# Patient Record
Sex: Male | Born: 1960 | Race: Black or African American | Hispanic: No | Marital: Married | Smoking: Former smoker
Health system: Southern US, Community
[De-identification: ages and names within clinical notes are randomized; demographics above are authoritative.]

## PROBLEM LIST (undated history)

## (undated) DIAGNOSIS — K648 Other hemorrhoids: Secondary | ICD-10-CM

## (undated) DIAGNOSIS — K635 Polyp of colon: Secondary | ICD-10-CM

## (undated) DIAGNOSIS — Z5189 Encounter for other specified aftercare: Secondary | ICD-10-CM

## (undated) DIAGNOSIS — E43 Unspecified severe protein-calorie malnutrition: Secondary | ICD-10-CM

## (undated) DIAGNOSIS — J449 Chronic obstructive pulmonary disease, unspecified: Secondary | ICD-10-CM

## (undated) DIAGNOSIS — I10 Essential (primary) hypertension: Secondary | ICD-10-CM

## (undated) DIAGNOSIS — D649 Anemia, unspecified: Secondary | ICD-10-CM

## (undated) DIAGNOSIS — J189 Pneumonia, unspecified organism: Secondary | ICD-10-CM

## (undated) DIAGNOSIS — F17200 Nicotine dependence, unspecified, uncomplicated: Secondary | ICD-10-CM

## (undated) DIAGNOSIS — D500 Iron deficiency anemia secondary to blood loss (chronic): Secondary | ICD-10-CM

## (undated) DIAGNOSIS — D563 Thalassemia minor: Secondary | ICD-10-CM

## (undated) DIAGNOSIS — A048 Other specified bacterial intestinal infections: Secondary | ICD-10-CM

## (undated) HISTORY — DX: Essential (primary) hypertension: I10

## (undated) HISTORY — DX: Anemia, unspecified: D64.9

## (undated) HISTORY — DX: Iron deficiency anemia secondary to blood loss (chronic): D50.0

## (undated) HISTORY — DX: Polyp of colon: K63.5

## (undated) HISTORY — DX: Unspecified severe protein-calorie malnutrition: E43

## (undated) HISTORY — PX: OTHER SURGICAL HISTORY: SHX169

## (undated) HISTORY — DX: Pneumonia, unspecified organism: J18.9

## (undated) HISTORY — DX: Encounter for other specified aftercare: Z51.89

## (undated) HISTORY — DX: Other hemorrhoids: K64.8

## (undated) HISTORY — DX: Chronic obstructive pulmonary disease, unspecified: J44.9

## (undated) HISTORY — DX: Thalassemia minor: D56.3

## (undated) HISTORY — DX: Other specified bacterial intestinal infections: A04.8

---

## 1998-06-23 ENCOUNTER — Emergency Department (HOSPITAL_COMMUNITY): Admission: EM | Admit: 1998-06-23 | Discharge: 1998-06-23 | Payer: Self-pay | Admitting: Emergency Medicine

## 2000-06-18 ENCOUNTER — Emergency Department (HOSPITAL_COMMUNITY): Admission: EM | Admit: 2000-06-18 | Discharge: 2000-06-18 | Payer: Self-pay | Admitting: Emergency Medicine

## 2000-06-18 ENCOUNTER — Encounter: Payer: Self-pay | Admitting: Emergency Medicine

## 2001-06-03 ENCOUNTER — Encounter: Payer: Self-pay | Admitting: Emergency Medicine

## 2001-06-03 ENCOUNTER — Emergency Department (HOSPITAL_COMMUNITY): Admission: EM | Admit: 2001-06-03 | Discharge: 2001-06-03 | Payer: Self-pay | Admitting: Emergency Medicine

## 2001-08-29 ENCOUNTER — Emergency Department (HOSPITAL_COMMUNITY): Admission: EM | Admit: 2001-08-29 | Discharge: 2001-08-29 | Payer: Self-pay

## 2005-11-18 ENCOUNTER — Ambulatory Visit: Payer: Self-pay | Admitting: Cardiology

## 2005-11-18 ENCOUNTER — Emergency Department (HOSPITAL_COMMUNITY): Admission: EM | Admit: 2005-11-18 | Discharge: 2005-11-18 | Payer: Self-pay | Admitting: Emergency Medicine

## 2006-07-01 ENCOUNTER — Emergency Department (HOSPITAL_COMMUNITY): Admission: EM | Admit: 2006-07-01 | Discharge: 2006-07-01 | Payer: Self-pay | Admitting: Emergency Medicine

## 2006-07-03 ENCOUNTER — Encounter: Admission: RE | Admit: 2006-07-03 | Discharge: 2006-07-03 | Payer: Self-pay | Admitting: Occupational Medicine

## 2006-07-28 ENCOUNTER — Ambulatory Visit (HOSPITAL_COMMUNITY): Admission: RE | Admit: 2006-07-28 | Discharge: 2006-07-29 | Payer: Self-pay | Admitting: Neurological Surgery

## 2007-06-12 ENCOUNTER — Emergency Department (HOSPITAL_COMMUNITY): Admission: EM | Admit: 2007-06-12 | Discharge: 2007-06-12 | Payer: Self-pay | Admitting: Emergency Medicine

## 2008-09-12 ENCOUNTER — Ambulatory Visit (HOSPITAL_COMMUNITY): Admission: RE | Admit: 2008-09-12 | Discharge: 2008-09-12 | Payer: Self-pay | Admitting: Orthopedic Surgery

## 2008-10-19 ENCOUNTER — Ambulatory Visit (HOSPITAL_COMMUNITY): Admission: RE | Admit: 2008-10-19 | Discharge: 2008-10-19 | Payer: Self-pay | Admitting: Orthopedic Surgery

## 2009-08-17 ENCOUNTER — Emergency Department (HOSPITAL_COMMUNITY): Admission: EM | Admit: 2009-08-17 | Discharge: 2009-08-17 | Payer: Self-pay | Admitting: Emergency Medicine

## 2009-09-27 ENCOUNTER — Encounter: Admission: RE | Admit: 2009-09-27 | Discharge: 2009-09-27 | Payer: Self-pay | Admitting: Nephrology

## 2009-11-17 ENCOUNTER — Encounter: Admission: RE | Admit: 2009-11-17 | Discharge: 2009-11-17 | Payer: Self-pay | Admitting: Neurological Surgery

## 2010-01-01 ENCOUNTER — Encounter (INDEPENDENT_AMBULATORY_CARE_PROVIDER_SITE_OTHER): Payer: Self-pay | Admitting: Neurological Surgery

## 2010-01-01 ENCOUNTER — Ambulatory Visit (HOSPITAL_COMMUNITY): Admission: RE | Admit: 2010-01-01 | Discharge: 2010-01-02 | Payer: Self-pay | Admitting: Neurological Surgery

## 2010-09-17 ENCOUNTER — Encounter: Admission: RE | Admit: 2010-09-17 | Discharge: 2010-09-17 | Payer: Self-pay | Admitting: Neurological Surgery

## 2011-02-24 LAB — CBC
HCT: 42.1 % (ref 39.0–52.0)
Hemoglobin: 13.7 g/dL (ref 13.0–17.0)
MCHC: 32.6 g/dL (ref 30.0–36.0)
MCV: 79.7 fL (ref 78.0–100.0)
Platelets: 165 10*3/uL (ref 150–400)
WBC: 7.3 10*3/uL (ref 4.0–10.5)

## 2011-02-24 LAB — BASIC METABOLIC PANEL
Calcium: 9.5 mg/dL (ref 8.4–10.5)
Creatinine, Ser: 1.02 mg/dL (ref 0.4–1.5)
GFR calc Af Amer: >60 mL/min (ref >60)
Sodium: 139 mEq/L (ref 135–145)

## 2011-04-23 NOTE — Op Note (Signed)
NAME:  TRUMAN, ACEITUNO                ACCOUNT NO.:  [id number]   MEDICAL RECORD NO.:  [id number]          PATIENT TYPE:  AMB   LOCATION:  SDS                          FACILITY:  [facility]   PHYSICIAN:  Vania Rea. Supple, M.D.  DATE OF BIRTH:  15-Jun-1961   DATE OF PROCEDURE:  10/19/2008  DATE OF DISCHARGE:                               OPERATIVE REPORT   PREOPERATIVE DIAGNOSES:  1. Chronic right shoulder impingement syndrome.  2. 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   POSTOPERATIVE DIAGNOSES:  1. Chronic right shoulder impingement syndrome.  2. 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  3. Complex and extensive degenerative labral tear.  4. Partial articular rotator cuff tear.   PROCEDURES:  1. Right shoulder examination under anesthesia.  2. Right shoulder diagnostic arthroscopy.  3. Debridement of complex and extensive labral tear involving the      anterior, superior, and posterior aspects of labrum.  4. Debridement of partial articular rotator cuff tear.  5. Arthroscopic subacromial decompression and bursectomy.  6. Arthroscopic distal clavicle resection.   SURGEON:  Vania Rea. Supple, MD   ASSISTANT:  Lucita Lora. Shuford, P.A.-C.   ANESTHESIA:  General endotracheal as well as a preop interscalene block.   ESTIMATED BLOOD LOSS:  Minimal.   DRAINS:  None.   HISTORY:  Mr. Heart is a 50 year old gentleman who has had chronic  right shoulder pain with weakness and restriction in mobility and  symptoms that have been refractory to prolonged attempts at conservative  management.  His examination shows a severely positive impingement sign  with radiographs confirming AC joint degenerative changes.  Due to his  ongoing pain, functional limitations, and failure to respond to  prolonged attempts at conservative management, he is brought to the  operating room at this time for planned right shoulder arthroscopy as  described below.   Preoperatively counseled Mr. Mecham on treatment options as well as  risks and benefits thereof.  Possible surgical complications of  bleeding, infection, neurovascular injury, persistent pain, loss of  motion, anesthetic complication, possible need for additional surgery  are reviewed.  He understands and accepts and agrees with our planned  procedure.   PROCEDURE IN DETAIL:  After undergoing routine preop evaluation, the  patient received prophylactic antibiotics.  An interscalene block was  established in holding area by the Anesthesia Department.  Placed supine  on the operating table and underwent smooth induction of a general  endotracheal anesthesia.  Turned to the left lateral decubitus position  on a beanbag and appropriately padded and protected.  A right shoulder  examination under anesthesia revealed full motion with glenohumeral  mobility of the upper limits of normal.  Right arm was suspended at 70  degrees of abduction with 10 pounds traction.  The right shoulder girdle  region was sterilely prepped and draped in standard fashion.  Posterior  portal was established from the glenohumeral joint and anterior portal  was established under direct visualization  The glenohumeral articular  surfaces were in good condition.  The glenohumeral joint volume was at  the upper limits and normal.  There was  an element of mild  multidirectional instability with capacious capsular volume.  However,  it did not identify any obvious pathologic instability patterns.  There  was, however, degenerative tearing of the anterior, superior, and  posterior labrum, and all the degenerative labral tear was debrided back  to a stable margin with a shaver.  The biceps tendon showed normal  caliber and quality tissue and was stable both proximally and distally.  The anterior rotator cuff was in good condition.  There was, however, a  partial articular sided tear involving the distal infraspinatus.   This  area was debrided with a shaver.  We did pass a tag suture of 0 PDS at  this level.  We then completed the inspection within the glenohumeral  joint.  Hemostasis was obtained.  Fluid and instruments were removed.  The arm was then dropped down to 30 degrees of abduction with the  arthroscope introduced in the subacromial space at the posterior portal,  direct lateral portal was established in the subacromial space.  There  was noted be abundant dense proliferative bursal tissue throughout the  subacromial plus the subdeltoid bursa and this was removed with a  combination of the shaver and the Arthrex wand.  The wand was then used  to remove the periosteum from the undersurface of the anterior half of  the acromion and then a subacromial decompression was performed with a  bur creating a type of morphology.  A portal was then established in the  trochlear anterior to distal clavicle.  The [facility] joint showed advanced  arthritic changes.  Distal clavicle resection was performed with a bur  removing approximately 8-mm of bone and visualization of the entire  circumference, the distal clavicle did confirm adequate removal of bone.  We then completed a subacromial/subdeltoid bursectomy.  The bursal  surface of the rotator cuff was then carefully inspected and probed in  particularly the area around the tag suture.  I did not identify any  compromised area of tendon tissue when I evaluated from the bursal side.  The tag suture was removed.  Bursectomy was completed.  Hemostasis was  obtained.  Fluid and instruments were removed.  The portals was closed  with Monocryl and Steri-Strips.  A bulky dry dressing was then taped  over the right shoulder and her right arm was placed in a sling  immobilizer.  The patient was placed supine, extubated, and taken to the  recovery room in stable condition.      Vania Rea. Supple, M.D.  Electronically Signed     KMS/MEDQ  D:  10/19/2008  T:   10/20/2008  Job:  [id number]

## 2011-04-26 NOTE — Consult Note (Signed)
NAME:  Vincent [name], Vincent [name] NO.:  [id number]   MEDICAL RECORD NO.:  [id number]          PATIENT TYPE:  EMS   LOCATION:  MAJO                         FACILITY:  [facility]   PHYSICIAN:  Olga Millers, M.D. LHCDATE OF BIRTH:  Feb 27, 1961   DATE OF CONSULTATION:  DATE OF DISCHARGE:                                   CONSULTATION   REFERRING PHYSICIAN:  Dr. Linwood Dibbles.   PRIMARY CARE PHYSICIAN:  None.   PRIMARY CARDIOLOGIST:  New and will be Dr. Jens Som.   CHIEF COMPLAINT:  Chest pain.   HISTORY OF PRESENT ILLNESS:  Vincent [name] is a 50 year old male with no  history of coronary artery disease.  He had onset of left back pain at  approximately 11 a.m. yesterday that moved around to the lower left part of  his chest.  His symptoms are worse with deep inspiration and coughing.  He  says he slept okay.  He says the pain is 1/10 and increases to a 10/10 with  cough or deep inspiration.  He did not try any medication for this at all.  He has never had these symptoms before.  He went to [facility] and received  aspirin and was sent to the emergency room, where he has been on IV  nitroglycerin, but it has not helped.  He was transported to the ER by EMS  from [facility].   PAST MEDICAL HISTORY:  He has a new history of diabetes, hypertension and  hyperlipidemia, although his cholesterol has not recently been checked.  His  cardiac risk factors are family history of coronary artery disease, ongoing  tobacco use and ETOH use.  He has a history of chest pain with Cardiolite  approximately a year ago that he says was ordered by Dr. Barbee Shropshire and was  negative.  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   SURGICAL HISTORY:  None.   ALLERGIES:  None.   MEDICATIONS:  No prescriptions.  No regular over-the-counter medications.   SOCIAL HISTORY:  He lives in [place] with his wife and works as a Clinical cytogeneticist and part-time at [employer].  He has  approximately a 30-  pack-year history of tobacco use.  He says he drinks a little bit less than  a 6-pack a day during the week and more on the weekends, but no hard liquor.  He denies drug use.   FAMILY HISTORY:  His mother is alive at age 9 and takes heart pills.  His  father died at age 63 with an MI and his sister is alive, but had bypass  surgery at age 11.   REVIEW OF SYSTEMS:  Review of systems is significant for a 10-pound weight  loss in the last couple of months, although he says he is not dieting.  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  The chest pain is described  above; he has shortness of breath with this.  He denies dyspnea on exertion,  orthopnea, PND, edema or palpitations.  He has occasional coughing and  wheezing.  He denies  hematemesis or melena, but he has daily dark blood in  his stools.  He denies reflux symptoms.  Review of systems is otherwise  negative.   PHYSICAL EXAMINATION:  VITAL SIGNS:  Temperature is 97.5 degrees, blood  pressure 149/92, pulse 92, respiratory rate 22, O2 saturation 98% on room  air.  GENERAL:  He is a slender African American male in no acute distress.  HEENT:  His head is normocephalic and atraumatic with extraocular movements  intact.  Sclera is cloudy on the right.  Nares are without discharge.  NECK:  There is no lymphadenopathy, thyromegaly, bruit or JVD noted.  CV:  His heart is regular in rate and rhythm with an S1, S2 and an S4 with  no significant murmur, and no rub is noted.  Distal pulses are 2+ and no  femoral bruits are appreciated.  LUNGS:  Clear to auscultation bilaterally.  SKIN:  No rashes or lesions are noted.  ABDOMEN:  Soft and nontender with active bowel sounds and no  hepatosplenomegaly by palpation.  EXTREMITIES:  There is no cyanosis, clubbing or edema.  MUSCULOSKELETAL:  There is no joint deformity or effusion and no spine or  CVA tenderness is noted.  NEUROLOGIC:  He is alert and oriented.   Cranial nerves II-XII are grossly  intact.   RADIOLOGIC FINDINGS:  Chest x-ray:  No acute disease.   ACCESSORY CLINICAL DATA:  EKG:  Sinus rhythm, rate 83, with no acute  ischemic changes and no old is available for the comparison.   LABORATORY VALUES:  D-dimer less than 0.22.  Point-of-care markers negative  x3, except for the third myoglobin was elevated at 268.   IMPRESSION:  Vincent [name] is a 50 year old male with cardiac risk factor of  family history and tobacco, who presents with chest pain.  The pain has been  constant since yesterday, when it began in his back and radiated to the left  side of his chest.  It is increased with certain movements and is sharp.  There is no associated nausea, vomiting, diaphoresis or shortness of breath,  except it increases with deep inspiration.  He does not have a history of  exertional chest pain, dyspnea on exertion or congestive heart failure  symptoms.  His EKG is normal sinus rhythm with no ST changes and his chest x-  ray has no acute disease.  Cardiac enzymes are negative greater than 24  hours after onset of pain and D-dimer is negative as well.  His chest pain  is consistent with musculoskeletal pain.  This will be treated with  nonsteroidal anti-inflammatory drugs.  He has ruled out because his chest  pain is greater than 24 hours in duration with negative enzymes.  He  apparently had a negative nuclear study approximately a year ago.  He will  be discharged home from the emergency room and follow up with [facility].  No  further cardiac workup is indicated.  A TSH will be obtained prior to  discharge with his history of weight loss and questionable exophthalmus.   COMMENT:  This is Theodore Demark, P.A.-C dictating for Dr. Olga Millers,  who saw the patient and determined the plan of care.      Theodore Demark, P.A. [facility]    ______________________________  Olga Millers, M.D. [facility]   RB/MEDQ  D:  11/18/2005  T:  11/19/2005  Job:   [id number]

## 2011-04-26 NOTE — Op Note (Signed)
NAME:  RAMONE, GANDER                ACCOUNT NO.:  [id number]   MEDICAL RECORD NO.:  [id number]          PATIENT TYPE:  OIB   LOCATION:  3010                         FACILITY:  [facility]   PHYSICIAN:  Stefani Dama, M.D.  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   PROCEDURE:  Anterior cervical decompression C6-C7 arthrodesis with peak  spacer and allograft with fixation with Alphatec plate Z6-S0.   SURGEON:  Stefani Dama, M.D.   ANESTHESIA:  General endotracheal.   INDICATIONS:  Nechemia Chiappetta is a 50 year old individual who has neck,  shoulder, and arm pain bilaterally.  He has evidence of a herniated nucleus  pulposus at the C6-C7 level.  He has particular weakness in the C8  distribution secondary to myelopathy.  Having failed effort at conservative  treatment noting that his symptoms seem only to be getting worse, he has  been advised regarding surgical decompression.   DESCRIPTION OF PROCEDURE:  The patient was brought to the operating room,  placed on table in the supine position after the smooth induction of general  endotracheal anesthesia.  He was placed in 5 pounds of Holter traction and  the neck was prepped with DuraPrep and draped in the sterile fashion.  A  transverse incision was made in the lower portion of the neck on the left  side.  This was carried down through the platysma.  The plane between the  sternocleidomastoid and the strap muscles was dissected bluntly until the  prevertebral space was reached.  The first identifiable disk space was noted  to be that of C6-C7.  The longus coli muscle was stripped off either side of  the ventral aspect of the vertebral body and then 30 mm  Caspar blades were  placed under the longus colli muscles, so as to allow a self-retaining  retractor to rest in the space.  Diskectomy was then performed at C6-C7 by  opening the anterior longitudinal ligament using a #15 blade removing a  large portion of the ventral aspect of the disk.  The disk was noted be  moderately degenerated; and a small curette was then used to remove the  other portions of this, particularly that attached to the endplates.  As the  region of the posterior longitudinal ligament was reached on the right side,  particularly there was noted be a significant subligamentous protrusion of  the disk that seemed to allow for compression of the common dural tube with  the spinal cord by bowing the posterior longitudinal ligament towards the  canal this material was removed in the posterior longitudinal ligament was  ultimately opened.   This area was decompressed down to the right side removing a moderate size  uncinate spur.  On the left side no such spur existed; however, there was  also subligamentous herniated disk material which was  removed; and this  allowed for a good decompression out to the region of the foramen.  Once the  dura was decompressed the endplates were then shaved smooth over the high-  speed bur and a 2.3-mm dissecting tool and then the interspace was sized for  appropriate size spacer; and it was felt that a medium size 7-mm spacer  would fit nicely into this interspace.  This was then packed with  demineralized bone matrix; and placed into the interspace at the C6-C7  level.  A ventral plate was placed; and this was felt to be fitted best with  an 18-mm standard size Alphatec plate; that was fixed with fixed angle  screws in the vertebral bodies of C7 and variable angle screws measuring 14  mm in length in the body of C6.  Once this was placed final localizing  radiograph identified good position of the hardware.  The hardware was  locked into  the plate and hemostasis was then checked carefully in the soft  tissues.  Once this was ascertained to be the case, the platysma was then  closed with 3-0 Vicryl interrupted fashion, 3-0 Vicryl used in the  subcuticular tissues.  Dermabond was placed on the skin.  The patient  tolerated the procedure well; and was returned to recovery room in stable  condition.      Stefani Dama, M.D.  Electronically Signed     HJE/MEDQ  D:  07/28/2006  T:  07/29/2006  Job:  [id number]

## 2011-09-10 LAB — CBC
MCV: 75.3 — ABNORMAL LOW
Platelets: 133 — ABNORMAL LOW
RBC: 3.96 — ABNORMAL LOW
RDW: 15.7 — ABNORMAL HIGH

## 2011-09-10 LAB — APTT: aPTT: 29

## 2011-09-10 LAB — URINALYSIS, ROUTINE W REFLEX MICROSCOPIC
Glucose, UA: NEGATIVE
Hgb urine dipstick: NEGATIVE
Ketones, ur: 15 — AB
Nitrite: NEGATIVE
Protein, ur: NEGATIVE
Specific Gravity, Urine: 1.02
Urobilinogen, UA: 1
pH: 5.5

## 2011-09-10 LAB — COMPREHENSIVE METABOLIC PANEL
Albumin: 3.7
Alkaline Phosphatase: 81
Calcium: 9.2
Creatinine, Ser: 0.95
Glucose, Bld: 100 — ABNORMAL HIGH
Potassium: 4.2
Sodium: 135
Total Bilirubin: 0.7
Total Protein: 7.4

## 2011-09-10 LAB — PROTIME-INR: INR: 1

## 2011-09-10 LAB — URINE MICROSCOPIC-ADD ON

## 2011-09-24 LAB — POCT CARDIAC MARKERS
CKMB, poc: 1.7
CKMB, poc: 1.8
Myoglobin, poc: 81.8
Operator id: 3206
Operator id: 4661
Troponin i, poc: <0.05

## 2011-09-24 LAB — COMPREHENSIVE METABOLIC PANEL
BUN: 3 — ABNORMAL LOW
Calcium: 9.1
Chloride: 115 — ABNORMAL HIGH
GFR calc non Af Amer: >60
Total Bilirubin: 0.5
Total Protein: 8.1

## 2011-09-24 LAB — DIFFERENTIAL
Lymphocytes Relative: 37
Neutro Abs: 3.4

## 2011-09-24 LAB — CBC
MCHC: 32.8
WBC: 6.4

## 2012-01-06 ENCOUNTER — Encounter (HOSPITAL_COMMUNITY): Payer: Self-pay | Admitting: Emergency Medicine

## 2012-01-06 ENCOUNTER — Emergency Department (HOSPITAL_COMMUNITY)
Admission: EM | Admit: 2012-01-06 | Discharge: 2012-01-06 | Disposition: A | Discharge status: Home / Self Care | Payer: Medicare Other | Attending: Emergency Medicine | Admitting: Emergency Medicine

## 2012-01-06 ENCOUNTER — Emergency Department (HOSPITAL_COMMUNITY): Payer: Medicare Other

## 2012-01-06 DIAGNOSIS — H409 Unspecified glaucoma: Secondary | ICD-10-CM | POA: Insufficient documentation

## 2012-01-06 DIAGNOSIS — R059 Cough, unspecified: Secondary | ICD-10-CM | POA: Insufficient documentation

## 2012-01-06 DIAGNOSIS — J40 Bronchitis, not specified as acute or chronic: Secondary | ICD-10-CM | POA: Insufficient documentation

## 2012-01-06 DIAGNOSIS — R51 Headache: Secondary | ICD-10-CM | POA: Insufficient documentation

## 2012-01-06 DIAGNOSIS — R05 Cough: Secondary | ICD-10-CM | POA: Insufficient documentation

## 2012-01-06 DIAGNOSIS — R61 Generalized hyperhidrosis: Secondary | ICD-10-CM | POA: Insufficient documentation

## 2012-01-06 DIAGNOSIS — I10 Essential (primary) hypertension: Secondary | ICD-10-CM | POA: Insufficient documentation

## 2012-01-06 DIAGNOSIS — H5440 Blindness, one eye, unspecified eye: Secondary | ICD-10-CM | POA: Insufficient documentation

## 2012-01-06 HISTORY — DX: Essential (primary) hypertension: I10

## 2012-01-06 MED ORDER — PREDNISONE 20 MG PO TABS: 40.0000 mg | ORAL_TABLET | Freq: Every day | ORAL | Status: AC

## 2012-01-06 MED ORDER — PREDNISONE 20 MG PO TABS
40.0000 mg | ORAL_TABLET | Freq: Once | ORAL | Status: AC
  Administered 2012-01-06: 40 mg via ORAL
  Filled 2012-01-06: qty 2

## 2012-01-06 MED ORDER — GUAIFENESIN-CODEINE 100-10 MG/5ML PO SYRP: 5.0000 mL | ORAL_SOLUTION | Freq: Three times a day (TID) | ORAL | Status: AC | PRN

## 2012-01-06 MED ORDER — ALBUTEROL SULFATE (5 MG/ML) 0.5% IN NEBU
5.0000 mg | INHALATION_SOLUTION | Freq: Once | RESPIRATORY_TRACT | Status: AC
  Administered 2012-01-06: 5 mg via RESPIRATORY_TRACT
  Filled 2012-01-06: qty 0.5

## 2012-01-06 MED ORDER — IBUPROFEN 200 MG PO TABS
400.0000 mg | ORAL_TABLET | Freq: Once | ORAL | Status: AC
  Administered 2012-01-06: 400 mg via ORAL
  Filled 2012-01-06: qty 2

## 2012-01-06 MED ORDER — IPRATROPIUM BROMIDE 0.02 % IN SOLN
0.5000 mg | Freq: Once | RESPIRATORY_TRACT | Status: AC
  Administered 2012-01-06: 0.5 mg via RESPIRATORY_TRACT
  Filled 2012-01-06: qty 2.5

## 2012-01-06 MED ORDER — OXYCODONE-ACETAMINOPHEN 5-325 MG PO TABS
1.0000 | ORAL_TABLET | Freq: Once | ORAL | Status: AC
  Administered 2012-01-06: 1 via ORAL
  Filled 2012-01-06: qty 1

## 2012-01-06 NOTE — ED Notes (Signed)
Pt complains of cold symptoms, congestion, headache and cough blood tinged sputum.

## 2012-01-06 NOTE — ED Notes (Signed)
Patient transported to X-ray 

## 2012-01-06 NOTE — ED Notes (Signed)
Pt back from X-ray.  

## 2012-01-06 NOTE — ED Notes (Signed)
Pt states he has bad night sweats

## 2012-01-06 NOTE — ED Provider Notes (Signed)
History    51 year old male with cough. Gradual onset about a month ago. Occasionally productive for whitish sputum. No fever. Sometimes has night sweats. No unusual leg pain or swelling. Denies history of blood clot. Patient is a smoker. Patient has been complaining of headaches for about the past month or so. This pain in the arch region and upper neck with radiation around to the forehead and behind his eyes. No appreciable exacerbating or relieving factors. No neck stiffness. No confusion per patient and his wife. 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 Patient states he's not sure what the reason for this was, possibly glaucoma. No new visual complaints. No nausea or vomiting. No weight loss  CSN: [id number]  Arrival date & time 01/06/12  1047   First MD Initiated Contact with Patient 01/06/12 1057      Chief Complaint  Patient presents with  . Cough    (Consider location/radiation/quality/duration/timing/severity/associated sxs/prior treatment) HPI  Past Medical History  Diagnosis Date  . Glaucoma   . Hypertension     Past Surgical History  Procedure Date  . Plate in neck     No family history on file.  History  Substance Use Topics  . Smoking status: Current Everyday Smoker  . Smokeless tobacco: Not on file  . Alcohol Use: Yes      Review of Systems   Review of symptoms negative unless otherwise noted in HPI.   Allergies  Review of patient's allergies indicates no known allergies.  Home Medications  No current outpatient prescriptions on file.  BP 155/97  Temp 97.8 F (36.6 C)  Resp 16  SpO2 100%  Physical Exam  Nursing note and vitals reviewed. Constitutional: He is oriented to person, place, and time. He appears well-developed and well-nourished. No distress.       Sitting up in bed. No acute distress.  HENT:  Head: Normocephalic and atraumatic.  Eyes: Conjunctivae and EOM are normal. Right eye exhibits no discharge. Left eye exhibits no  discharge.       Right pupil midpoint and nonreactive. Left pupil reactive to light and accommodation.  Neck: Normal range of motion. Neck supple.  Cardiovascular: Normal rate, regular rhythm and normal heart sounds.  Exam reveals no gallop and no friction rub.   No murmur heard. Pulmonary/Chest: Effort normal and breath sounds normal. No respiratory distress.  Abdominal: Soft. He exhibits no distension. There is no tenderness.  Musculoskeletal: He exhibits no edema and no tenderness.  Lymphadenopathy:    He has no cervical adenopathy.  Neurological: He is alert and oriented to person, place, and time. No cranial nerve deficit. Coordination normal.       Normal appearing gait. Good finger to nose testing bilaterally.  Skin: Skin is warm and dry.  Psychiatric: He has a normal mood and affect. His behavior is normal. Thought content normal.    ED Course  Procedures (including critical care time)  Labs Reviewed - No data to display No results found.   1. Bronchitis   2. Headache       MDM  51 year old male with cough and headache. Low suspicion for serious bacterial illness. Chest x-ray with no focal opacity. Patient with some mild wheezing on exam but no respiratory distress. No complaints of dyspnea and no hypoxia on room air. Doubt pulmonary embolism. Suspect bronchitis. Will give short course of steroids. Consider malignancy with night sweats. No other associated symptoms such as weight loss or findings on examination to suggest this. Patient's long smoking  history does put him at increased risk for. Patient has PCP followup. Feel that this can be further evaluated as an outpatient. Suspect primary headache, possibly tension. Consider secondary merchant causes such as bleed, infectious, mass, carbon dioxide poisoning, vertebral/carotid artery dissection, venous thrombosis or oyular etiology such as acute angle closure glaucoma or temporal arteritis but doubt. Patient has a nonfocal  neurological examination. History trauma. Blood thinning medication. Has no acute visual complaints. No contacts with similar symptoms. No signs of meningismus. Plan symptomatic treatment for HA. Return precautions discussed. Outpatient fu.      Raeford Razor, MD 01/06/12 1208

## 2012-01-06 NOTE — ED Notes (Signed)
Cough and h/a  X 1 month

## 2012-12-15 ENCOUNTER — Encounter (HOSPITAL_COMMUNITY): Payer: Self-pay | Admitting: *Deleted

## 2012-12-15 ENCOUNTER — Emergency Department (HOSPITAL_COMMUNITY)
Admission: EM | Admit: 2012-12-15 | Discharge: 2012-12-15 | Disposition: A | Discharge status: Home / Self Care | Payer: Medicare Other | Attending: Emergency Medicine | Admitting: Emergency Medicine

## 2012-12-15 ENCOUNTER — Emergency Department (HOSPITAL_COMMUNITY): Payer: Medicare Other

## 2012-12-15 DIAGNOSIS — R0602 Shortness of breath: Secondary | ICD-10-CM | POA: Insufficient documentation

## 2012-12-15 DIAGNOSIS — R0789 Other chest pain: Secondary | ICD-10-CM | POA: Insufficient documentation

## 2012-12-15 DIAGNOSIS — R059 Cough, unspecified: Secondary | ICD-10-CM | POA: Insufficient documentation

## 2012-12-15 DIAGNOSIS — R05 Cough: Secondary | ICD-10-CM | POA: Insufficient documentation

## 2012-12-15 DIAGNOSIS — R52 Pain, unspecified: Secondary | ICD-10-CM | POA: Insufficient documentation

## 2012-12-15 DIAGNOSIS — R6889 Other general symptoms and signs: Secondary | ICD-10-CM

## 2012-12-15 DIAGNOSIS — R0989 Other specified symptoms and signs involving the circulatory and respiratory systems: Secondary | ICD-10-CM | POA: Insufficient documentation

## 2012-12-15 DIAGNOSIS — R197 Diarrhea, unspecified: Secondary | ICD-10-CM | POA: Insufficient documentation

## 2012-12-15 DIAGNOSIS — F17200 Nicotine dependence, unspecified, uncomplicated: Secondary | ICD-10-CM | POA: Insufficient documentation

## 2012-12-15 DIAGNOSIS — I10 Essential (primary) hypertension: Secondary | ICD-10-CM | POA: Insufficient documentation

## 2012-12-15 DIAGNOSIS — R110 Nausea: Secondary | ICD-10-CM | POA: Insufficient documentation

## 2012-12-15 MED ORDER — PREDNISONE 20 MG PO TABS
40.0000 mg | ORAL_TABLET | Freq: Once | ORAL | Status: AC
  Administered 2012-12-15: 40 mg via ORAL
  Filled 2012-12-15: qty 2

## 2012-12-15 MED ORDER — IPRATROPIUM BROMIDE 0.02 % IN SOLN
0.5000 mg | Freq: Once | RESPIRATORY_TRACT | Status: AC
  Administered 2012-12-15: 0.5 mg via RESPIRATORY_TRACT
  Filled 2012-12-15: qty 2.5

## 2012-12-15 MED ORDER — ALBUTEROL SULFATE (5 MG/ML) 0.5% IN NEBU
5.0000 mg | INHALATION_SOLUTION | Freq: Once | RESPIRATORY_TRACT | Status: AC
  Administered 2012-12-15: 5 mg via RESPIRATORY_TRACT
  Filled 2012-12-15: qty 1

## 2012-12-15 MED ORDER — HYDROMORPHONE HCL PF 1 MG/ML IJ SOLN
1.0000 mg | Freq: Once | INTRAMUSCULAR | Status: AC
  Administered 2012-12-15: 1 mg via INTRAVENOUS
  Filled 2012-12-15: qty 1

## 2012-12-15 MED ORDER — GUAIFENESIN-CODEINE 100-10 MG/5ML PO SYRP: 5.0000 mL | ORAL_SOLUTION | Freq: Three times a day (TID) | ORAL | Status: DC | PRN

## 2012-12-15 MED ORDER — KETOROLAC TROMETHAMINE 15 MG/ML IJ SOLN
15.0000 mg | Freq: Once | INTRAMUSCULAR | Status: AC
  Administered 2012-12-15: 15 mg via INTRAVENOUS
  Filled 2012-12-15: qty 1

## 2012-12-15 MED ORDER — METOCLOPRAMIDE HCL 5 MG/ML IJ SOLN
5.0000 mg | Freq: Once | INTRAMUSCULAR | Status: AC
  Administered 2012-12-15: 5 mg via INTRAVENOUS
  Filled 2012-12-15: qty 2

## 2012-12-15 MED ORDER — SODIUM CHLORIDE 0.9 % IV BOLUS (SEPSIS)
1000.0000 mL | Freq: Once | INTRAVENOUS | Status: AC
  Administered 2012-12-15: 1000 mL via INTRAVENOUS

## 2012-12-15 MED ORDER — PREDNISONE 20 MG PO TABS: 40.0000 mg | ORAL_TABLET | Freq: Every day | ORAL | Status: DC

## 2012-12-15 MED ORDER — ACETAMINOPHEN 325 MG PO TABS
650.0000 mg | ORAL_TABLET | Freq: Once | ORAL | Status: AC
  Administered 2012-12-15: 650 mg via ORAL
  Filled 2012-12-15: qty 2

## 2012-12-15 MED ORDER — ALBUTEROL SULFATE HFA 108 (90 BASE) MCG/ACT IN AERS
2.0000 | INHALATION_SPRAY | Freq: Once | RESPIRATORY_TRACT | Status: AC
  Administered 2012-12-15: 2 via RESPIRATORY_TRACT
  Filled 2012-12-15: qty 6.7

## 2012-12-15 NOTE — ED Provider Notes (Signed)
History    52 year old male with generalized body aches, fever and chills, nonproductive cough and chest congestion since approximately Saturday. Progressively worsening. Symptoms relatively constant. Nausea, no vomiting. No urinary complaints. Couple episodes of diarrhea today. Nonbloody. No recent antibiotic use. No recent travel history. No sick contacts.  CSN: [id number]  Arrival date & time 12/15/12  1402   First MD Initiated Contact with Patient 12/15/12 1528      Chief Complaint  Patient presents with  . Generalized Body Aches  . Fever  . Cough  . Chest Pain  . Shortness of Breath    (Consider location/radiation/quality/duration/timing/severity/associated sxs/prior treatment) HPI  Past Medical History  Diagnosis Date  . Glaucoma(365)   . Hypertension     Past Surgical History  Procedure Date  . Plate in neck     No family history on file.  History  Substance Use Topics  . Smoking status: Current Every Day Smoker  . Smokeless tobacco: Never Used  . Alcohol Use: No      Review of Systems  All systems reviewed and negative, other than as noted in HPI.   Allergies  Review of patient's allergies indicates no known allergies.  Home Medications   Current Outpatient Rx  Name  Route  Sig  Dispense  Refill  . GUAIFENESIN 100 MG/5ML PO SOLN   Oral   Take 10 mLs by mouth every 4 (four) hours as needed. For cough           BP 162/64  Pulse 111  Temp 99.3 F (37.4 C)  Resp 22  SpO2 100%  Physical Exam  Nursing note and vitals reviewed. Constitutional: He appears well-developed and well-nourished. No distress.       Uncomfortable appearing, but not toxic.  HENT:  Head: Normocephalic and atraumatic.  Eyes: Conjunctivae normal are normal. Pupils are equal, round, and reactive to light. Right eye exhibits no discharge. Left eye exhibits no discharge.  Neck: Normal range of motion. Neck supple.       No nuchal rigidity  Cardiovascular: Regular rhythm  and normal heart sounds.  Exam reveals no gallop and no friction rub.   No murmur heard.      Mild tachycardia with a regular rhythm  Pulmonary/Chest: Effort normal. No respiratory distress. He has wheezes.       Mild expiratory wheezing bilaterally.  Abdominal: Soft. He exhibits no distension. There is no tenderness.  Musculoskeletal: He exhibits no edema and no tenderness.       Lower extremities symmetric as compared to each other. No calf tenderness. Negative Homan's. No palpable cords.   Neurological: He is alert. He exhibits normal muscle tone.  Skin: Skin is warm and dry. He is not diaphoretic.  Psychiatric: He has a normal mood and affect. His behavior is normal. Thought content normal.    ED Course  Procedures (including critical care time)  Labs Reviewed - No data to display Dg Chest 2 View  12/15/2012  *RADIOLOGY REPORT*  Clinical Data: Shortness of breath, cough, fever symptoms  CHEST - 2 VIEW  Comparison:  01/06/2012, 09/27/2009  Findings:  The heart size and mediastinal contours are within normal limits.  Both lungs are clear.  The visualized skeletal structures are unremarkable.  IMPRESSION: No active cardiopulmonary disease.   Original Report Authenticated By: Judie Petit. Shick, M.D.    EKG:  Rhythm: Normal sinus rhythm Vent. rate 113 BPM PR interval 152 ms QRS duration 64 ms QT/QTc 304/417 ms Biatrial Enlargement ST segments: Nonspecific  ST changes   1. Body aches   2. Flu-like symptoms       MDM  52 year old male with chief complaint fever chills and generalized body aches. Suspect patient may have influenza. He does have some wheezing on exam. He has a smoking history. Chest x-ray is clear. Plan symptomatic treatment at this time. His chest pain is very atypical for ACS but will check EKG.  4:54 PM Patient still with some mild wheezing on exam, but remains without any respiratory distress. Oxygen saturations remained good on room air. We'll give a low dose of  albuterol and steroids. Additional medication for aches and pains. Will continue to monitor but anticipate discharge at this time.      Raeford Razor, MD 12/19/12 0111

## 2012-12-15 NOTE — ED Notes (Signed)
Pt states started having body aches, fever, shortness of breath, cough, congestion, chest pressure and vomiting 3 days ago, today started having diarrhea. Pt states when he lays on his R arm he has tingling.

## 2013-07-05 ENCOUNTER — Ambulatory Visit: Payer: Medicare Other | Attending: Family Medicine | Admitting: Family Medicine

## 2013-07-05 ENCOUNTER — Encounter: Payer: Self-pay | Admitting: Family Medicine

## 2013-07-05 VITALS — BP 153/85 | HR 75 | Temp 98.3°F | Ht 67.0 in | Wt 143.8 lb

## 2013-07-05 DIAGNOSIS — Z87891 Personal history of nicotine dependence: Secondary | ICD-10-CM | POA: Insufficient documentation

## 2013-07-05 DIAGNOSIS — Z79899 Other long term (current) drug therapy: Secondary | ICD-10-CM | POA: Insufficient documentation

## 2013-07-05 DIAGNOSIS — IMO0001 Reserved for inherently not codable concepts without codable children: Secondary | ICD-10-CM

## 2013-07-05 DIAGNOSIS — I10 Essential (primary) hypertension: Secondary | ICD-10-CM

## 2013-07-05 DIAGNOSIS — G8929 Other chronic pain: Secondary | ICD-10-CM | POA: Insufficient documentation

## 2013-07-05 DIAGNOSIS — R030 Elevated blood-pressure reading, without diagnosis of hypertension: Secondary | ICD-10-CM

## 2013-07-05 DIAGNOSIS — G4700 Insomnia, unspecified: Secondary | ICD-10-CM

## 2013-07-05 DIAGNOSIS — F17200 Nicotine dependence, unspecified, uncomplicated: Secondary | ICD-10-CM

## 2013-07-05 DIAGNOSIS — Z0000 Encounter for general adult medical examination without abnormal findings: Secondary | ICD-10-CM

## 2013-07-05 DIAGNOSIS — M542 Cervicalgia: Secondary | ICD-10-CM | POA: Insufficient documentation

## 2013-07-05 HISTORY — DX: Essential (primary) hypertension: I10

## 2013-07-05 MED ORDER — TRAMADOL HCL 50 MG PO TABS: 50.0000 mg | ORAL_TABLET | Freq: Three times a day (TID) | ORAL | Status: DC | PRN

## 2013-07-05 MED ORDER — AMITRIPTYLINE HCL 50 MG PO TABS: 50.0000 mg | ORAL_TABLET | Freq: Every day | ORAL | Status: DC

## 2013-07-05 MED ORDER — GABAPENTIN 600 MG PO TABS: 600.0000 mg | ORAL_TABLET | Freq: Every day | ORAL | Status: DC

## 2013-07-05 MED ORDER — METHOCARBAMOL 500 MG PO TABS: 500.0000 mg | ORAL_TABLET | Freq: Four times a day (QID) | ORAL | Status: DC | PRN

## 2013-07-05 MED ORDER — NAPROXEN 500 MG PO TABS: 500.0000 mg | ORAL_TABLET | Freq: Two times a day (BID) | ORAL | Status: DC | PRN

## 2013-07-05 NOTE — Progress Notes (Signed)
Patient ID: Vincent [name], male   DOB: 05/16/1961, 52 y.o.   MRN: [id number]  CC:  New Patient   HPI: Pt reports that he is having chronic neck pain.  He had 2 fusion surgeries done on neck Dr. Danielle Dess several years ago and reporting he has been out of his meds for a long time as Dr. Cindra Presume office closed.  He has been on multiple meds but has been out of them for several months.  He had taken prednisone daily for pain in his hands and joints in hands and fingers.  Pt. Had been on valium and percocet also but had been out of that medication for a couple of months. Pt describes pain and not being able to sleep and headaches on right side of head.   No Known Allergies Past Medical History  Diagnosis Date  . Glaucoma   . Hypertension    Current Outpatient Prescriptions on File Prior to Visit  Medication Sig Dispense Refill  . guaiFENesin (ROBITUSSIN) 100 MG/5ML SOLN Take 10 mLs by mouth every 4 (four) hours as needed. For cough      . guaiFENesin-codeine (ROBITUSSIN AC) 100-10 MG/5ML syrup Take 5 mLs by mouth 3 (three) times daily as needed for cough.  120 mL  0  . predniSONE (DELTASONE) 20 MG tablet Take 2 tablets (40 mg total) by mouth daily.  10 tablet  0   No current facility-administered medications on file prior to visit.   No family history on file. History   Social History  . Marital Status: Married    Spouse Name: N/A    Number of Children: N/A  . Years of Education: N/A   Occupational History  . Not on file.   Social History Main Topics  . Smoking status: Current Every Day Smoker  . Smokeless tobacco: Never Used  . Alcohol Use: No  . Drug Use: No  . Sexually Active: Not on file   Other Topics Concern  . Not on file   Social History Narrative  . No narrative on file    Review of Systems  Constitutional: Negative for fever, chills, diaphoresis, activity change, appetite change and fatigue.  HENT: Negative for ear pain, nosebleeds, congestion, facial swelling,  rhinorrhea, neck pain, neck stiffness and ear discharge.   Eyes: Negative for pain, discharge, redness, itching and visual disturbance.  Respiratory: Negative for cough, choking, chest tightness, shortness of breath, wheezing and stridor.   Cardiovascular: Negative for chest pain, palpitations and leg swelling.  Gastrointestinal: Negative for abdominal distention.  Genitourinary: Negative for dysuria, urgency, frequency, hematuria, flank pain, decreased urine volume, difficulty urinating and dyspareunia.  Musculoskeletal: joint pain and hand pain, chronic neck pain.  Neurological: Negative for dizziness, tremors, seizures, syncope, facial asymmetry, speech difficulty, weakness, light-headedness, numbness and headaches.  Hematological: Negative for adenopathy. Does not bruise/bleed easily.  Psychiatric/Behavioral: Negative for hallucinations, behavioral problems, confusion, dysphoric mood, decreased concentration and agitation.    Objective:   Filed Vitals:   07/05/13 1017  BP: 153/85  Pulse: 75  Temp: 98.3 F (36.8 C)    Physical Exam  Constitutional: Appears well-developed and well-nourished. No distress.  HENT: Normocephalic. External right and left ear normal. Oropharynx is clear and moist.  Eyes: Conjunctivae and EOM are normal. PERRLA, no scleral icterus.  Neck: Normal ROM. Neck supple. No JVD. No tracheal deviation. No thyromegaly.  CVS: RRR, S1/S2 +, no murmurs, no gallops, no carotid bruit.  Pulmonary: Effort and breath sounds normal, no stridor, rhonchi, wheezes, rales.  Abdominal: Soft. BS +,  no distension, tenderness, rebound or guarding.  Musculoskeletal: Normal range of motion. No edema and no tenderness.  Lymphadenopathy: No lymphadenopathy noted, cervical, inguinal. Neuro: Alert. Normal reflexes, muscle tone coordination. No cranial nerve deficit. Skin: Skin is warm and dry. No rash noted. Not diaphoretic. No erythema. No pallor.  Psychiatric: Normal mood and affect.  Behavior, judgment, thought content normal.   Lab Results  Component Value Date   WBC 7.3 12/27/2009   HGB 13.7 12/27/2009   HCT 42.1 12/27/2009   MCV 79.7 12/27/2009   PLT 165 12/27/2009   Lab Results  Component Value Date   CREATININE 1.02 12/27/2009   BUN 4* 12/27/2009   NA 139 12/27/2009   K 4.5 12/27/2009   CL 107 12/27/2009   CO2 26 12/27/2009    No results found for this basename: HGBA1C   Lipid Panel  No results found for this basename: chol, trig, hdl, cholhdl, vldl, ldlcalc       Assessment and plan:   Patient Active Problem List   Diagnosis Date Noted  . Neck pain 07/05/2013   Refilled meds except, did not refill prednisone as pt has been off for several months and did not refill valium as patient has been off for some months.  Did not refill percocet and explained to patient that we did not do chronic pain mgmt.  Also, pt declined to have a pain mgmt referral.  Pt says that he will discuss when he follows up.   Check labs today.   Follow lab results   Refer to Dr. Danielle Dess in neurosurgery to evaluate neck problems.   RTC in 2 months  The patient was given clear instructions to go to ER or return to medical center if symptoms don't improve, worsen or new problems develop.  The patient verbalized understanding.  The patient was told to call to get any lab results if not heard anything in the next week.     Rodney Langton, MD, CDE, FAAFP [facility] [place] [place], [place]

## 2013-07-05 NOTE — Patient Instructions (Addendum)
Insomnia Insomnia is frequent trouble falling and/or staying asleep. Insomnia can be a long term problem or a short term problem. Both are common. Insomnia can be a short term problem when the wakefulness is related to a certain stress or worry. Long term insomnia is often related to ongoing stress during waking hours and/or poor sleeping habits. Overtime, sleep deprivation itself can make the problem worse. Every little thing feels more severe because you are overtired and your ability to cope is decreased. CAUSES   Stress, anxiety, and depression.  Poor sleeping habits.  Distractions such as TV in the bedroom.  Naps close to bedtime.  Engaging in emotionally charged conversations before bed.  Technical reading before sleep.  Alcohol and other sedatives. They may make the problem worse. They can hurt normal sleep patterns and normal dream activity.  Stimulants such as caffeine for several hours prior to bedtime.  Pain syndromes and shortness of breath can cause insomnia.  Exercise late at night.  Changing time zones may cause sleeping problems (jet lag). It is sometimes helpful to have someone observe your sleeping patterns. They should look for periods of not breathing during the night (sleep apnea). They should also look to see how long those periods last. If you live alone or observers are uncertain, you can also be observed at a sleep clinic where your sleep patterns will be professionally monitored. Sleep apnea requires a checkup and treatment. Give your caregivers your medical history. Give your caregivers observations your family has made about your sleep.  SYMPTOMS   Not feeling rested in the morning.  Anxiety and restlessness at bedtime.  Difficulty falling and staying asleep. TREATMENT   Your caregiver may prescribe treatment for an underlying medical disorders. Your caregiver can give advice or help if you are using alcohol or other drugs for self-medication. Treatment  of underlying problems will usually eliminate insomnia problems.  Medications can be prescribed for short time use. They are generally not recommended for lengthy use.  Over-the-counter sleep medicines are not recommended for lengthy use. They can be habit forming.  You can promote easier sleeping by making lifestyle changes such as:  Using relaxation techniques that help with breathing and reduce muscle tension.  Exercising earlier in the day.  Changing your diet and the time of your last meal. No night time snacks.  Establish a regular time to go to bed.  Counseling can help with stressful problems and worry.  Soothing music and white noise may be helpful if there are background noises you cannot remove.  Stop tedious detailed work at least one hour before bedtime. HOME CARE INSTRUCTIONS   Keep a diary. Inform your caregiver about your progress. This includes any medication side effects. See your caregiver regularly. Take note of:  Times when you are asleep.  Times when you are awake during the night.  The quality of your sleep.  How you feel the next day. This information will help your caregiver care for you.  Get out of bed if you are still awake after 15 minutes. Read or do some quiet activity. Keep the lights down. Wait until you feel sleepy and go back to bed.  Keep regular sleeping and waking hours. Avoid naps.  Exercise regularly.  Avoid distractions at bedtime. Distractions include watching television or engaging in any intense or detailed activity like attempting to balance the household checkbook.  Develop a bedtime ritual. Keep a familiar routine of bathing, brushing your teeth, climbing into bed at the same   time each night, listening to soothing music. Routines increase the success of falling to sleep faster.  Use relaxation techniques. This can be using breathing and muscle tension release routines. It can also include visualizing peaceful scenes. You can  also help control troubling or intruding thoughts by keeping your mind occupied with boring or repetitive thoughts like the old concept of counting sheep. You can make it more creative like imagining planting one beautiful flower after another in your backyard garden.  During your day, work to eliminate stress. When this is not possible use some of the previous suggestions to help reduce the anxiety that accompanies stressful situations. MAKE SURE YOU:   Understand these instructions.  Will watch your condition.  Will get help right away if you are not doing well or get worse. Document Released: 11/22/2000 Document Revised: 02/17/2012 Document Reviewed: 12/23/2007 [facility] Patient Information 2014 [place], [place]. Chronic Pain Management Managing chronic pain is not easy. The goal is to provide as much pain relief as possible. There are emotional as well as physical problems. Chronic pain may lead to symptoms of depression which magnify those of the pain. Problems may include:  Anxiety.  Sleep disturbances.  Confused thinking.  Feeling cranky.  Fatigue.  Weight gain or loss. Identify the source of the pain first, if possible. The pain may be masking another problem. Try to find a pain management specialist or clinic. Work with a team to create a treatment plan for you. MEDICATIONS  May include narcotics or opioids. Larger than normal doses may be needed to control your pain.  Drugs for depression may help.  Over-the-counter medicines may help for some conditions. These drugs may be used along with others for better pain relief.  May be injected into sites such as the spine and joints. Injections may have to be repeated if they wear off. THERAPY MAY INCLUDE:  Working with a physical therapist to keep from getting stiff.  Regular, gentle exercise.  Cognitive or behavioral therapy.  Using complementary or integrative medicine such as:  Acupuncture.  Massage, Reiki, or  Rolfing.  Aroma, color, light, or sound therapy.  Group support. FOR MORE INFORMATION [email or web address]. American Chronic Pain Association [email or web address]. Document Released: 01/02/2005 Document Revised: 02/17/2012 Document Reviewed: 02/11/2008 [facility] Patient Information 2014 [place], [place]. Chronic Pain Chronic pain can be defined as pain that is lasting, off and on, and lasts for 3 to 6 months or longer. Many things cause chronic pain, which can make it difficult to make a discrete diagnosis. There are many treatment options available for chronic pain. However, finding a treatment that works well for you may require trying various approaches until a suitable one is found. CAUSES  In some types of chronic medical conditions, the pain is caused by a normal pain response within the body. A normal pain response helps the body identify illness or injury and prevent further damage from being done. In these cases, the cause of the pain may be identified and treated, even if it may not be cured completely. Examples of chronic conditions which can cause chronic pain include:  Inflammation of the joints (arthritis).  Back pain or neck pain (including bulging or herniated disks).  Migraine headaches.  Cancer. In some other types of chronic pain syndromes, the pain is caused by an abnormal pain response within the body. An abnormal pain response is present when there is no ongoing cause (or stimulus) for the pain, or when the cause of the pain is arising from the nerves  or nervous system itself. Examples of conditions which can cause chronic pain due to an abnormal pain response include:  Fibromyalgia.  Reflex sympathetic dystrophy (RSD).  Neuropathy (when the nerves themselves are damaged, and may cause pain). DIAGNOSIS  Your caregiver will help diagnose your condition over time. In many cases, the initial focus will be on excluding conditions that could be causing the  pain. Depending on your symptoms, your caregiver may order some tests to diagnose your condition. Some of these tests include:  Blood tests.  Computerized X-ray scans (CT scan).  Computerized magnetic scans (MRI).  X-rays.  Ultrasounds.  Nerve conduction studies.  Consultation with other physicians or specialists. TREATMENT  There are many treatment options for people suffering from chronic pain. Finding a treatment that works well may take time.   You may be referred to a pain management specialist.  You may be put on medication to help with the pain. Unfortunately, some medications (such as opiate medications) may not be very effective in cases where chronic pain is due to abnormal pain responses. Finding the right medications can take some time.  Adjunctive therapies may be used to provide additional relief and improve a patient's quality of life. These therapies include:  Mindfulness meditation.  Acupuncture.  Biofeedback.  Cognitive-behavioral therapy.  In certain cases, surgical interventions may be attempted. HOME CARE INSTRUCTIONS   Make sure you understand these instructions prior to discharge.  Ask any questions and share any further concerns you have with your caregiver prior to discharge.  Take all medications as directed by your caregiver.  Keep all follow-up appointments. SEEK MEDICAL CARE IF:   Your pain gets worse.  You develop a new pain that was not present before.  You cannot tolerate any medications prescribed by your caregiver.  You develop new symptoms since your last visit with your caregiver. SEEK IMMEDIATE MEDICAL CARE IF:   You develop muscular weakness.  You have decreased sensation or numbness.  You lose control of bowel or bladder function.  Your pain suddenly gets much worse.  You have an oral temperature above 102 F (38.9 C), not controlled by medication.  You develop shaking chills, confusion, chest pain, or shortness of  breath. Document Released: 08/17/2002 Document Revised: 02/17/2012 Document Reviewed: 11/23/2008 [facility] Patient Information 2014 [place], [place]. Smoking Cessation, Tips for Success YOU CAN QUIT SMOKING If you are ready to quit smoking, congratulations! You have chosen to help yourself be healthier. Cigarettes bring nicotine, tar, carbon monoxide, and other irritants into your body. Your lungs, heart, and blood vessels will be able to work better without these poisons. There are many different ways to quit smoking. Nicotine gum, nicotine patches, a nicotine inhaler, or nicotine nasal spray can help with physical craving. Hypnosis, support groups, and medicines help break the habit of smoking. Here are some tips to help you quit for good.  Throw away all cigarettes.  Clean and remove all ashtrays from your home, work, and car.  On a card, write down your reasons for quitting. Carry the card with you and read it when you get the urge to smoke.  Cleanse your body of nicotine. Drink enough water and fluids to keep your urine clear or pale yellow. Do this after quitting to flush the nicotine from your body.  Learn to predict your moods. Do not let a bad situation be your excuse to have a cigarette. Some situations in your life might tempt you into wanting a cigarette.  Never have "just one"  cigarette. It leads to wanting another and another. Remind yourself of your decision to quit.  Change habits associated with smoking. If you smoked while driving or when feeling stressed, try other activities to replace smoking. Stand up when drinking your coffee. Brush your teeth after eating. Sit in a different chair when you read the paper. Avoid alcohol while trying to quit, and try to drink fewer caffeinated beverages. Alcohol and caffeine may urge you to smoke.  Avoid foods and drinks that can trigger a desire to smoke, such as sugary or spicy foods and alcohol.  Ask people who smoke not to smoke around  you.  Have something planned to do right after eating or having a cup of coffee. Take a walk or exercise to perk you up. This will help to keep you from overeating.  Try a relaxation exercise to calm you down and decrease your stress. Remember, you may be tense and nervous for the first 2 weeks after you quit, but this will pass.  Find new activities to keep your hands busy. Play with a pen, coin, or rubber band. Doodle or draw things on paper.  Brush your teeth right after eating. This will help cut down on the craving for the taste of tobacco after meals. You can try mouthwash, too.  Use oral substitutes, such as lemon drops, carrots, a cinnamon stick, or chewing gum, in place of cigarettes. Keep them handy so they are available when you have the urge to smoke.  When you have the urge to smoke, try deep breathing.  Designate your home as a nonsmoking area.  If you are a heavy smoker, ask your caregiver about a prescription for nicotine chewing gum. It can ease your withdrawal from nicotine.  Reward yourself. Set aside the cigarette money you save and buy yourself something nice.  Look for support from others. Join a support group or smoking cessation program. Ask someone at home or at work to help you with your plan to quit smoking.  Always ask yourself, "Do I need this cigarette or is this just a reflex?" Tell yourself, "Today, I choose not to smoke," or "I do not want to smoke." You are reminding yourself of your decision to quit, even if you do smoke a cigarette. HOW WILL I FEEL WHEN I QUIT SMOKING?  The benefits of not smoking start within days of quitting.  You may have symptoms of withdrawal because your body is used to nicotine (the addictive substance in cigarettes). You may crave cigarettes, be irritable, feel very hungry, cough often, get headaches, or have difficulty concentrating.  The withdrawal symptoms are only temporary. They are strongest when you first quit but will  go away within 10 to 14 days.  When withdrawal symptoms occur, stay in control. Think about your reasons for quitting. Remind yourself that these are signs that your body is healing and getting used to being without cigarettes.  Remember that withdrawal symptoms are easier to treat than the major diseases that smoking can cause.  Even after the withdrawal is over, expect periodic urges to smoke. However, these cravings are generally short-lived and will go away whether you smoke or not. Do not smoke!  If you relapse and smoke again, do not lose hope. Most smokers quit 3 times before they are successful.  If you relapse, do not give up! Plan ahead and think about what you will do the next time you get the urge to smoke. LIFE AS A NONSMOKER: MAKE IT  FOR A MONTH, MAKE IT FOR LIFE Day 1: Hang this page where you will see it every day. Day 2: Get rid of all ashtrays, matches, and lighters. Day 3: Drink water. Breathe deeply between sips. Day 4: Avoid places with smoke-filled air, such as bars, clubs, or the smoking section of restaurants. Day 5: Keep track of how much money you save by not smoking. Day 6: Avoid boredom. Keep a good book with you or go to the movies. Day 7: Reward yourself! One week without smoking! Day 8: Make a dental appointment to get your teeth cleaned. Day 9: Decide how you will turn down a cigarette before it is offered to you. Day 10: Review your reasons for quitting. Day 11: Distract yourself. Stay active to keep your mind off smoking and to relieve tension. Take a walk, exercise, read a book, do a crossword puzzle, or try a new hobby. Day 12: Exercise. Get off the bus before your stop or use stairs instead of escalators. Day 13: Call on friends for support and encouragement. Day 14: Reward yourself! Two weeks without smoking! Day 15: Practice deep breathing exercises. Day 16: Bet a friend that you can stay a nonsmoker. Day 17: Ask to sit in nonsmoking sections of  restaurants. Day 18: Hang up "No Smoking" signs. Day 19: Think of yourself as a nonsmoker. Day 20: Each morning, tell yourself you will not smoke. Day 21: Reward yourself! Three weeks without smoking! Day 22: Think of smoking in negative ways. Remember how it stains your teeth, gives you bad breath, and leaves you short of breath. Day 23: Eat a nutritious breakfast. Day 24:Do not relive your days as a smoker. Day 25: Hold a pencil in your hand when talking on the telephone. Day 26: Tell all your friends you do not smoke. Day 27: Think about how much better food tastes. Day 28: Remember, one cigarette is one too many. Day 29: Take up a hobby that will keep your hands busy. Day 30: Congratulations! One month without smoking! Give yourself a big reward. Your caregiver can direct you to community resources or hospitals for support, which may include:  Group support.  Education.  Hypnosis.  Subliminal therapy. Document Released: 08/23/2004 Document Revised: 02/17/2012 Document Reviewed: 09/11/2009 [facility] Patient Information 2014 [place], [place].

## 2013-07-06 LAB — LIPID PANEL
Cholesterol: 147 mg/dL (ref 0–200)
Total CHOL/HDL Ratio: 3.1 Ratio

## 2013-07-06 LAB — COMPLETE METABOLIC PANEL WITH GFR
Albumin: 4.2 g/dL (ref 3.5–5.2)
Alkaline Phosphatase: 66 U/L (ref 39–117)
BUN: 8 mg/dL (ref 6–23)
GFR, Est Non African American: 75 mL/min
Glucose, Bld: 91 mg/dL (ref 70–99)
Potassium: 4.4 mEq/L (ref 3.5–5.3)
Total Bilirubin: 0.4 mg/dL (ref 0.3–1.2)

## 2013-07-06 LAB — CBC
MCH: 24.5 pg — ABNORMAL LOW (ref 26.0–34.0)
MCV: 73.4 fL — ABNORMAL LOW (ref 78.0–100.0)
Platelets: 137 10*3/uL — ABNORMAL LOW (ref 150–400)
RBC: 5.18 MIL/uL (ref 4.22–5.81)

## 2013-07-06 NOTE — Progress Notes (Signed)
Quick Note:  PLease inform patient that labs came back OK except that hemoglobin came back a little low and platelet count was a little low. Recheck CBC in 3 months.  Rodney Langton, MD, CDE, FAAFP [facility] [place], [place]   ______

## 2013-07-07 ENCOUNTER — Telehealth: Payer: Self-pay | Admitting: *Deleted

## 2013-07-07 NOTE — Telephone Encounter (Signed)
07/07/13  Patient made aware of lab results came back okay except hemoglobin came back A little low and platelet count was a litle low. Will recheck in 3 months. P.Beatrice Ziehm,RN BSN [facility]

## 2013-09-02 ENCOUNTER — Other Ambulatory Visit: Payer: Self-pay | Admitting: Neurological Surgery

## 2013-09-02 DIAGNOSIS — M5000 Cervical disc disorder with myelopathy, unspecified cervical region: Secondary | ICD-10-CM

## 2013-09-06 ENCOUNTER — Encounter: Payer: Self-pay | Admitting: Internal Medicine

## 2013-09-06 ENCOUNTER — Ambulatory Visit: Payer: Medicare Other | Attending: Internal Medicine | Admitting: Internal Medicine

## 2013-09-06 VITALS — BP 146/81 | HR 79 | Temp 98.9°F | Resp 16 | Wt 139.0 lb

## 2013-09-06 DIAGNOSIS — R718 Other abnormality of red blood cells: Secondary | ICD-10-CM | POA: Insufficient documentation

## 2013-09-06 DIAGNOSIS — D649 Anemia, unspecified: Secondary | ICD-10-CM

## 2013-09-06 LAB — CBC WITH DIFFERENTIAL/PLATELET
Basophils Relative: 1 % (ref 0–1)
Eosinophils Absolute: 0.1 10*3/uL (ref 0.0–0.7)
Eosinophils Relative: 1 % (ref 0–5)
HCT: 37.5 % — ABNORMAL LOW (ref 39.0–52.0)
Hemoglobin: 12.5 g/dL — ABNORMAL LOW (ref 13.0–17.0)
MCH: 24.7 pg — ABNORMAL LOW (ref 26.0–34.0)
MCHC: 33.3 g/dL (ref 30.0–36.0)
MCV: 74.1 fL — ABNORMAL LOW (ref 78.0–100.0)
Monocytes Absolute: 0.5 10*3/uL (ref 0.1–1.0)
Monocytes Relative: 11 % (ref 3–12)
Neutrophils Relative %: 55 % (ref 43–77)

## 2013-09-06 NOTE — Progress Notes (Signed)
Patient ID: Vincent [name], male   DOB: July 18, 1961, 52 y.o.   MRN: [id number]   CC: Followup  HPI: Patient is 52 year old male who comes to clinic for followup. He was told he needs to have his blood work checked because of suspected and anemia. He denies known blood loss in the stool, no constipation, no blood in urine. He denies systemic symptoms such as fevers and chills, no weight loss, changes in appetite, no chest pain or shortness of breath, no specific abdominal or urinary concerns. He reports compliance with medications.  No Known Allergies Past Medical History  Diagnosis Date  . Glaucoma   . Hypertension    Current Outpatient Prescriptions on File Prior to Visit  Medication Sig Dispense Refill  . amitriptyline (ELAVIL) 50 MG tablet Take 1 tablet (50 mg total) by mouth at bedtime.  30 tablet  2  . gabapentin (NEURONTIN) 600 MG tablet Take 1 tablet (600 mg total) by mouth daily.  30 tablet  2  . methocarbamol (ROBAXIN) 500 MG tablet Take 1 tablet (500 mg total) by mouth 4 (four) times daily as needed (muscle spasms).  30 tablet  1  . naproxen (NAPROSYN) 500 MG tablet Take 1 tablet (500 mg total) by mouth 2 (two) times daily as needed (pain,  take with meals).  30 tablet  1  . predniSONE (DELTASONE) 20 MG tablet Take 2 tablets (40 mg total) by mouth daily.  10 tablet  0  . traMADol (ULTRAM) 50 MG tablet Take 1 tablet (50 mg total) by mouth every 8 (eight) hours as needed for pain.  30 tablet  1  . diazepam (VALIUM) 2 MG tablet Take 2 mg by mouth every 8 (eight) hours as needed for anxiety.      Marland Kitchen oxyCODONE-acetaminophen (PERCOCET/ROXICET) 5-325 MG per tablet Take 1 tablet by mouth every 4 (four) hours as needed for pain.       No current facility-administered medications on file prior to visit.   No family history of cancers History   Social History  . Marital Status: Married    Spouse Name: N/A    Number of Children: N/A  . Years of Education: N/A   Occupational History  . Not  on file.   Social History Main Topics  . Smoking status: Current Every Day Smoker  . Smokeless tobacco: Never Used  . Alcohol Use: No  . Drug Use: No  . Sexual Activity: Not on file   Other Topics Concern  . Not on file   Social History Narrative  . No narrative on file    Review of Systems  Constitutional: Negative for fever, chills, diaphoresis, activity change, appetite change and fatigue.  HENT: Negative for ear pain, nosebleeds, congestion, facial swelling, rhinorrhea, neck pain, neck stiffness and ear discharge.   Eyes: Negative for pain, discharge, redness, itching and visual disturbance.  Respiratory: Negative for cough, choking, chest tightness, shortness of breath, wheezing and stridor.   Cardiovascular: Negative for chest pain, palpitations and leg swelling.  Gastrointestinal: Negative for abdominal distention.  Genitourinary: Negative for dysuria, urgency, frequency, hematuria, flank pain, decreased urine volume, difficulty urinating and dyspareunia.  Musculoskeletal: Negative for back pain, joint swelling, arthralgias and gait problem.  Neurological: Negative for dizziness, tremors, seizures, syncope, facial asymmetry, speech difficulty, weakness, light-headedness, numbness and headaches.  Hematological: Negative for adenopathy. Does not bruise/bleed easily.  Psychiatric/Behavioral: Negative for hallucinations, behavioral problems, confusion, dysphoric mood, decreased concentration and agitation.    Objective:   Filed Vitals:  09/06/13 1105  BP: 146/81  Pulse: 79  Temp: 98.9 F (37.2 C)  Resp: 16    Physical Exam  Constitutional: Appears well-developed and well-nourished. No distress.  HENT: Normocephalic. External right and left ear normal. Oropharynx is clear and moist.  Eyes: Conjunctivae and EOM are normal. PERRLA, no scleral icterus.  Neck: Normal ROM. Neck supple. No JVD. No tracheal deviation. No thyromegaly.  CVS: RRR, S1/S2 +, no murmurs, no  gallops, no carotid bruit.  Pulmonary: Effort and breath sounds normal, no stridor, rhonchi, wheezes, rales.  Abdominal: Soft. BS +,  no distension, tenderness, rebound or guarding.  Musculoskeletal: Normal range of motion. No edema and no tenderness.  Lymphadenopathy: No lymphadenopathy noted, cervical, inguinal. Neuro: Alert. Normal reflexes, muscle tone coordination. No cranial nerve deficit. Skin: Skin is warm and dry. No rash noted. Not diaphoretic. No erythema. No pallor.  Psychiatric: Normal mood and affect. Behavior, judgment, thought content normal.   Lab Results  Component Value Date   WBC 4.8 07/05/2013   HGB 12.7* 07/05/2013   HCT 38.0* 07/05/2013   MCV 73.4* 07/05/2013   PLT 137* 07/05/2013   Lab Results  Component Value Date   CREATININE 1.12 07/05/2013   BUN 8 07/05/2013   NA 138 07/05/2013   K 4.4 07/05/2013   CL 108 07/05/2013   CO2 25 07/05/2013    No results found for this basename: HGBA1C   Lipid Panel     Component Value Date/Time   CHOL 147 07/05/2013 1113   TRIG 68 07/05/2013 1113   HDL 48 07/05/2013 1113   CHOLHDL 3.1 07/05/2013 1113   VLDL 14 07/05/2013 1113   LDLCALC 85 07/05/2013 1113       Assessment and plan:   Patient Active Problem List   Diagnosis Date Noted  .  microcytosis without specific anemia  - with thrombocytopenia. Patient denies alcohol use, no specific medications a list that could cause thrombocytopenia. Will repeat CBC today and proceed with workup as indicated  07/05/2013  . Elevated blood pressure 07/05/2013

## 2013-09-06 NOTE — Progress Notes (Signed)
Pt is here for a f/u and review meds for chronic neck pain Dr. Danielle Dess the neurosurgeon has Rx him tegretol 200mg  on 9/25 Pt is alert w/no signs of acute distress.

## 2013-09-06 NOTE — Patient Instructions (Signed)

## 2013-09-07 ENCOUNTER — Telehealth: Payer: Self-pay | Admitting: Emergency Medicine

## 2013-09-07 NOTE — Telephone Encounter (Signed)
Message copied by Darlis Loan on Tue Sep 07, 2013  4:48 PM ------      Message from: Dorothea Ogle      Created: Tue Sep 07, 2013 10:08 AM       Can we please call pt and let him know that his platelets are more down from last visit. We have talked about it on his appointment extensively and he was made aware will call him at home. He needs to come back in 1 week to have blood tests repeated. ------

## 2013-09-07 NOTE — Telephone Encounter (Signed)
LEFT A MESSAGE FOR PT TO CALL BACK AND SCHEDULE 1 WEEK APPT FOR REPEAT BLOOD WORK

## 2013-09-11 ENCOUNTER — Ambulatory Visit
Admission: RE | Admit: 2013-09-11 | Discharge: 2013-09-11 | Disposition: A | Discharge status: Home / Self Care | Payer: Medicare Other | Source: Ambulatory Visit | Attending: Neurological Surgery | Admitting: Neurological Surgery

## 2013-09-11 DIAGNOSIS — M5000 Cervical disc disorder with myelopathy, unspecified cervical region: Secondary | ICD-10-CM

## 2013-09-20 ENCOUNTER — Ambulatory Visit: Payer: Medicare Other | Attending: Internal Medicine | Admitting: Internal Medicine

## 2013-09-20 ENCOUNTER — Encounter: Payer: Self-pay | Admitting: Internal Medicine

## 2013-09-20 VITALS — BP 168/100 | HR 104 | Temp 99.0°F | Resp 18

## 2013-09-20 DIAGNOSIS — Z299 Encounter for prophylactic measures, unspecified: Secondary | ICD-10-CM

## 2013-09-20 LAB — CBC WITH DIFFERENTIAL/PLATELET
Basophils Absolute: 0.1 10*3/uL (ref 0.0–0.1)
Eosinophils Absolute: 0.1 10*3/uL (ref 0.0–0.7)
Eosinophils Relative: 2 % (ref 0–5)
MCH: 25.2 pg — ABNORMAL LOW (ref 26.0–34.0)
MCV: 73.7 fL — ABNORMAL LOW (ref 78.0–100.0)
Monocytes Absolute: 0.5 10*3/uL (ref 0.1–1.0)
Platelets: 165 10*3/uL (ref 150–400)
RDW: 16.3 % — ABNORMAL HIGH (ref 11.5–15.5)

## 2013-09-20 MED ORDER — METHOCARBAMOL 500 MG PO TABS: 500.0000 mg | ORAL_TABLET | Freq: Four times a day (QID) | ORAL | Status: DC | PRN

## 2013-09-20 MED ORDER — AMITRIPTYLINE HCL 50 MG PO TABS: 50.0000 mg | ORAL_TABLET | Freq: Every day | ORAL | Status: DC

## 2013-09-20 MED ORDER — NAPROXEN 500 MG PO TABS: 500.0000 mg | ORAL_TABLET | Freq: Two times a day (BID) | ORAL | Status: DC | PRN

## 2013-09-20 MED ORDER — DIAZEPAM 2 MG PO TABS: 2.0000 mg | ORAL_TABLET | Freq: Three times a day (TID) | ORAL | Status: DC | PRN

## 2013-09-20 MED ORDER — TRAMADOL HCL 50 MG PO TABS: 50.0000 mg | ORAL_TABLET | Freq: Three times a day (TID) | ORAL | Status: DC | PRN

## 2013-09-20 MED ORDER — GABAPENTIN 600 MG PO TABS: 600.0000 mg | ORAL_TABLET | Freq: Every day | ORAL | Status: DC

## 2013-09-20 MED ORDER — CARBAMAZEPINE 200 MG PO TABS: 200.0000 mg | ORAL_TABLET | Freq: Two times a day (BID) | ORAL | Status: DC

## 2013-09-20 NOTE — Patient Instructions (Addendum)

## 2013-09-20 NOTE — Progress Notes (Signed)
Pt here for repeat CBC BP elevated 168/100 104 Slight headache

## 2013-09-20 NOTE — Progress Notes (Addendum)
Patient ID: Vincent [name], male   DOB: 1961/07/25, 52 y.o.   MRN: [id number]  CC: Follow up  HPI: 52 year old male with past medical history of hypertension, depression, neuropathy, seizure disorder who presented to clinic for followup. Patient reports feeling fine this morning. No reports of seizures. No chest pain or shortness of breath. No feelings of being depressed or anxious.  No Known Allergies Past Medical History  Diagnosis Date  . Glaucoma   . Hypertension    Current Outpatient Prescriptions on File Prior to Visit  Medication Sig Dispense Refill  . oxyCODONE-acetaminophen (PERCOCET/ROXICET) 5-325 MG per tablet Take 1 tablet by mouth every 4 (four) hours as needed for pain.      . predniSONE (DELTASONE) 20 MG tablet Take 2 tablets (40 mg total) by mouth daily.  10 tablet  0   No current facility-administered medications on file prior to visit.   Heart disease in family.  History   Social History  . Marital Status: Married    Spouse Name: N/A    Number of Children: N/A  . Years of Education: N/A   Occupational History  . Not on file.   Social History Main Topics  . Smoking status: Current Every Day Smoker  . Smokeless tobacco: Never Used  . Alcohol Use: No  . Drug Use: No  . Sexual Activity: Not on file   Other Topics Concern  . Not on file   Social History Narrative  . No narrative on file    Review of Systems  Constitutional: Negative for fever, chills, diaphoresis, activity change, appetite change and fatigue.  HENT: Negative for ear pain, nosebleeds, congestion, facial swelling, rhinorrhea, neck pain, neck stiffness and ear discharge.   Eyes: Negative for pain, discharge, redness, itching and visual disturbance.  Respiratory: Negative for cough, choking, chest tightness, shortness of breath, wheezing and stridor.   Cardiovascular: Negative for chest pain, palpitations and leg swelling.  Gastrointestinal: Negative for abdominal distention.  Genitourinary:  Negative for dysuria, urgency, frequency, hematuria, flank pain, decreased urine volume, difficulty urinating and dyspareunia.  Musculoskeletal: Negative for back pain, joint swelling, arthralgias and gait problem.  Neurological: Negative for dizziness, tremors, seizures, syncope, facial asymmetry, speech difficulty, weakness, light-headedness, numbness and headaches.  Hematological: Negative for adenopathy. Does not bruise/bleed easily.  Psychiatric/Behavioral: Negative for hallucinations, behavioral problems, confusion, dysphoric mood, decreased concentration and agitation.    Objective:   Filed Vitals:   09/20/13 0930  BP: 168/100  Pulse: 104  Temp: 99 F (37.2 C)  Resp: 18    Physical Exam  Constitutional: Appears well-developed and well-nourished. No distress.  HENT: Normocephalic. External right and left ear normal. Oropharynx is clear and moist.  Eyes: Conjunctivae and EOM are normal. PERRLA, no scleral icterus.  Neck: Normal ROM. Neck supple. No JVD. No tracheal deviation. No thyromegaly.  CVS: RRR, S1/S2 +, no murmurs, no gallops, no carotid bruit.  Pulmonary: Effort and breath sounds normal, no stridor, rhonchi, wheezes, rales.  Abdominal: Soft. BS +,  no distension, tenderness, rebound or guarding.  Musculoskeletal: Normal range of motion. No edema and no tenderness.  Lymphadenopathy: No lymphadenopathy noted, cervical, inguinal. Neuro: Alert. Normal reflexes, muscle tone coordination. No cranial nerve deficit. Skin: Skin is warm and dry. No rash noted. Not diaphoretic. No erythema. No pallor.  Psychiatric: Normal mood and affect. Behavior, judgment, thought content normal.   Lab Results  Component Value Date   WBC 4.9 09/06/2013   HGB 12.5* 09/06/2013   HCT 37.5* 09/06/2013  MCV 74.1* 09/06/2013   PLT 70* 09/06/2013   Lab Results  Component Value Date   CREATININE 1.12 07/05/2013   BUN 8 07/05/2013   NA 138 07/05/2013   K 4.4 07/05/2013   CL 108 07/05/2013   CO2 25  07/05/2013    No results found for this basename: HGBA1C   Lipid Panel     Component Value Date/Time   CHOL 147 07/05/2013 1113   TRIG 68 07/05/2013 1113   HDL 48 07/05/2013 1113   CHOLHDL 3.1 07/05/2013 1113   VLDL 14 07/05/2013 1113   LDLCALC 85 07/05/2013 1113       Assessment and plan:   Patient Active Problem List   Diagnosis Date Noted  . Elevated blood pressure - Blood pressure elevated this morning but patient did not take blood pressure medications today - We have discussed target BP range - I have advised pt to check BP regularly and to call us back if the numbers are higher than 140/90 - discussed the importance of compliance with medical therapy and diet  07/05/2013  .  Seizure disorder  - Continue Tegretol  - Valium as needed  - Recheck CBC for the low platelet count of 70 and September 2014.  07/05/2013  .  Depression  - Stable. Continue Elavil 50 mg daily  07/05/2013  . Neuropathy - Continue gabapentin  07/05/2013

## 2013-09-20 NOTE — Addendum Note (Signed)
Addended by: Alison Murray on: 09/20/2013 09:49 AM   Modules accepted: Orders, Level of Service

## 2013-09-21 ENCOUNTER — Telehealth: Payer: Self-pay

## 2013-09-21 NOTE — Telephone Encounter (Signed)
Message copied by Lestine Mount on Tue Sep 21, 2013  9:43 AM ------      Message from: Quentin Angst      Created: Tue Sep 21, 2013  9:23 AM       Please call patient to inform him that his platelet count is now within normal, we may need to repeat it in about 6 months from now to make sure it stays within normal ------

## 2013-09-21 NOTE — Telephone Encounter (Signed)
Patient is aware of his lab results 

## 2013-10-12 ENCOUNTER — Telehealth: Payer: Self-pay | Admitting: Emergency Medicine

## 2013-11-24 ENCOUNTER — Ambulatory Visit: Payer: Medicare Other | Admitting: Internal Medicine

## 2014-08-09 ENCOUNTER — Ambulatory Visit: Payer: Medicare Other | Admitting: Internal Medicine

## 2014-08-16 ENCOUNTER — Ambulatory Visit: Payer: Medicare HMO | Attending: Internal Medicine | Admitting: Internal Medicine

## 2014-08-16 ENCOUNTER — Encounter: Payer: Self-pay | Admitting: Internal Medicine

## 2014-08-16 VITALS — BP 155/97 | HR 107 | Temp 98.4°F | Resp 16 | Ht 67.0 in | Wt 132.0 lb

## 2014-08-16 DIAGNOSIS — I10 Essential (primary) hypertension: Secondary | ICD-10-CM | POA: Diagnosis not present

## 2014-08-16 DIAGNOSIS — R51 Headache: Secondary | ICD-10-CM | POA: Diagnosis present

## 2014-08-16 DIAGNOSIS — F17200 Nicotine dependence, unspecified, uncomplicated: Secondary | ICD-10-CM | POA: Diagnosis not present

## 2014-08-16 DIAGNOSIS — Z23 Encounter for immunization: Secondary | ICD-10-CM | POA: Diagnosis not present

## 2014-08-16 MED ORDER — LISINOPRIL 10 MG PO TABS
10.0000 mg | ORAL_TABLET | Freq: Every day | ORAL | Status: DC
Start: 2014-08-16 — End: 2014-08-30

## 2014-08-16 NOTE — Progress Notes (Signed)
Pt is here today C/O constant headaches every morning for over a month now. Pt reports having fevers with sweating.

## 2014-08-16 NOTE — Patient Instructions (Signed)
DASH Eating Plan DASH stands for "Dietary Approaches to Stop Hypertension." The DASH eating plan is a healthy eating plan that has been shown to reduce high blood pressure (hypertension). Additional health benefits may include reducing the risk of type 2 diabetes mellitus, heart disease, and stroke. The DASH eating plan may also help with weight loss. WHAT DO I NEED TO KNOW ABOUT THE DASH EATING PLAN? For the DASH eating plan, you will follow these general guidelines:  Choose foods with a percent daily value for sodium of less than 5% (as listed on the food label).  Use salt-free seasonings or herbs instead of table salt or sea salt.  Check with your health care provider or pharmacist before using salt substitutes.  Eat lower-sodium products, often labeled as "lower sodium" or "no salt added."  Eat fresh foods.  Eat more vegetables, fruits, and low-fat dairy products.  Choose whole grains. Look for the word "whole" as the first word in the ingredient list.  Choose fish and skinless chicken or turkey more often than red meat. Limit fish, poultry, and meat to 6 oz (170 g) each day.  Limit sweets, desserts, sugars, and sugary drinks.  Choose heart-healthy fats.  Limit cheese to 1 oz (28 g) per day.  Eat more home-cooked food and less restaurant, buffet, and fast food.  Limit fried foods.  Cook foods using methods other than frying.  Limit canned vegetables. If you do use them, rinse them well to decrease the sodium.  When eating at a restaurant, ask that your food be prepared with less salt, or no salt if possible. WHAT FOODS CAN I EAT? Seek help from a dietitian for individual calorie needs. Grains Whole grain or whole wheat bread. Brown rice. Whole grain or whole wheat pasta. Quinoa, bulgur, and whole grain cereals. Low-sodium cereals. Corn or whole wheat flour tortillas. Whole grain cornbread. Whole grain crackers. Low-sodium crackers. Vegetables Fresh or frozen vegetables  (raw, steamed, roasted, or grilled). Low-sodium or reduced-sodium tomato and vegetable juices. Low-sodium or reduced-sodium tomato sauce and paste. Low-sodium or reduced-sodium canned vegetables.  Fruits All fresh, canned (in natural juice), or frozen fruits. Meat and Other Protein Products Ground beef (85% or leaner), grass-fed beef, or beef trimmed of fat. Skinless chicken or turkey. Ground chicken or turkey. Pork trimmed of fat. All fish and seafood. Eggs. Dried beans, peas, or lentils. Unsalted nuts and seeds. Unsalted canned beans. Dairy Low-fat dairy products, such as skim or 1% milk, 2% or reduced-fat cheeses, low-fat ricotta or cottage cheese, or plain low-fat yogurt. Low-sodium or reduced-sodium cheeses. Fats and Oils Tub margarines without trans fats. Light or reduced-fat mayonnaise and salad dressings (reduced sodium). Avocado. Safflower, olive, or canola oils. Natural peanut or almond butter. Other Unsalted popcorn and pretzels. The items listed above may not be a complete list of recommended foods or beverages. Contact your dietitian for more options. WHAT FOODS ARE NOT RECOMMENDED? Grains White bread. White pasta. White rice. Refined cornbread. Bagels and croissants. Crackers that contain trans fat. Vegetables Creamed or fried vegetables. Vegetables in a cheese sauce. Regular canned vegetables. Regular canned tomato sauce and paste. Regular tomato and vegetable juices. Fruits Dried fruits. Canned fruit in light or heavy syrup. Fruit juice. Meat and Other Protein Products Fatty cuts of meat. Ribs, chicken wings, bacon, sausage, bologna, salami, chitterlings, fatback, hot dogs, bratwurst, and packaged luncheon meats. Salted nuts and seeds. Canned beans with salt. Dairy Whole or 2% milk, cream, half-and-half, and cream cheese. Whole-fat or sweetened yogurt. Full-fat   cheeses or blue cheese. Nondairy creamers and whipped toppings. Processed cheese, cheese spreads, or cheese  curds. Condiments Onion and garlic salt, seasoned salt, table salt, and sea salt. Canned and packaged gravies. Worcestershire sauce. Tartar sauce. Barbecue sauce. Teriyaki sauce. Soy sauce, including reduced sodium. Steak sauce. Fish sauce. Oyster sauce. Cocktail sauce. Horseradish. Ketchup and mustard. Meat flavorings and tenderizers. Bouillon cubes. Hot sauce. Tabasco sauce. Marinades. Taco seasonings. Relishes. Fats and Oils Butter, stick margarine, lard, shortening, ghee, and bacon fat. Coconut, palm kernel, or palm oils. Regular salad dressings. Other Pickles and olives. Salted popcorn and pretzels. The items listed above may not be a complete list of foods and beverages to avoid. Contact your dietitian for more information. WHERE CAN I FIND MORE INFORMATION? National Heart, Lung, and Blood Institute: www.nhlbi.nih.gov/health/health-topics/topics/dash/ Document Released: 11/14/2011 Document Revised: 04/11/2014 Document Reviewed: 09/29/2013 ExitCare Patient Information 2015 [employer]. This information is not intended to replace advice given to you by your health care provider. Make sure you discuss any questions you have with your health care provider. Smoking Cessation Quitting smoking is important to your health and has many advantages. However, it is not always easy to quit since nicotine is a very addictive drug. Oftentimes, people try 3 times or more before being able to quit. This document explains the best ways for you to prepare to quit smoking. Quitting takes hard work and a lot of effort, but you can do it. ADVANTAGES OF QUITTING SMOKING  You will live longer, feel better, and live better.  Your body will feel the impact of quitting smoking almost immediately.  Within 20 minutes, blood pressure decreases. Your pulse returns to its normal level.  After 8 hours, carbon monoxide levels in the blood return to normal. Your oxygen level increases.  After 24 hours, the chance of  having a heart attack starts to decrease. Your breath, hair, and body stop smelling like smoke.  After 48 hours, damaged nerve endings begin to recover. Your sense of taste and smell improve.  After 72 hours, the body is virtually free of nicotine. Your bronchial tubes relax and breathing becomes easier.  After 2 to 12 weeks, lungs can hold more air. Exercise becomes easier and circulation improves.  The risk of having a heart attack, stroke, cancer, or lung disease is greatly reduced.  After 1 year, the risk of coronary heart disease is cut in half.  After 5 years, the risk of stroke falls to the same as a nonsmoker.  After 10 years, the risk of lung cancer is cut in half and the risk of other cancers decreases significantly.  After 15 years, the risk of coronary heart disease drops, usually to the level of a nonsmoker.  If you are pregnant, quitting smoking will improve your chances of having a healthy baby.  The people you live with, especially any children, will be healthier.  You will have extra money to spend on things other than cigarettes. QUESTIONS TO THINK ABOUT BEFORE ATTEMPTING TO QUIT You may want to talk about your answers with your health care provider.  Why do you want to quit?  If you tried to quit in the past, what helped and what did not?  What will be the most difficult situations for you after you quit? How will you plan to handle them?  Who can help you through the tough times? Your family? Friends? A health care provider?  What pleasures do you get from smoking? What ways can you still get pleasure   if you quit? Here are some questions to ask your health care provider:  How can you help me to be successful at quitting?  What medicine do you think would be best for me and how should I take it?  What should I do if I need more help?  What is smoking withdrawal like? How can I get information on withdrawal? GET READY  Set a quit date.  Change your  environment by getting rid of all cigarettes, ashtrays, matches, and lighters in your home, car, or work. Do not let people smoke in your home.  Review your past attempts to quit. Think about what worked and what did not. GET SUPPORT AND ENCOURAGEMENT You have a better chance of being successful if you have help. You can get support in many ways.  Tell your family, friends, and coworkers that you are going to quit and need their support. Ask them not to smoke around you.  Get individual, group, or telephone counseling and support. Programs are available at local hospitals and health centers. Call your local health department for information about programs in your area.  Spiritual beliefs and practices may help some smokers quit.  Download a "quit meter" on your computer to keep track of quit statistics, such as how long you have gone without smoking, cigarettes not smoked, and money saved.  Get a self-help book about quitting smoking and staying off tobacco. LEARN NEW SKILLS AND BEHAVIORS  Distract yourself from urges to smoke. Talk to someone, go for a walk, or occupy your time with a task.  Change your normal routine. Take a different route to work. Drink tea instead of coffee. Eat breakfast in a different place.  Reduce your stress. Take a hot bath, exercise, or read a book.  Plan something enjoyable to do every day. Reward yourself for not smoking.  Explore interactive web-based programs that specialize in helping you quit. GET MEDICINE AND USE IT CORRECTLY Medicines can help you stop smoking and decrease the urge to smoke. Combining medicine with the above behavioral methods and support can greatly increase your chances of successfully quitting smoking.  Nicotine replacement therapy helps deliver nicotine to your body without the negative effects and risks of smoking. Nicotine replacement therapy includes nicotine gum, lozenges, inhalers, nasal sprays, and skin patches. Some may be  available over-the-counter and others require a prescription.  Antidepressant medicine helps people abstain from smoking, but how this works is unknown. This medicine is available by prescription.  Nicotinic receptor partial agonist medicine simulates the effect of nicotine in your brain. This medicine is available by prescription. Ask your health care provider for advice about which medicines to use and how to use them based on your health history. Your health care provider will tell you what side effects to look out for if you choose to be on a medicine or therapy. Carefully read the information on the package. Do not use any other product containing nicotine while using a nicotine replacement product.  RELAPSE OR DIFFICULT SITUATIONS Most relapses occur within the first 3 months after quitting. Do not be discouraged if you start smoking again. Remember, most people try several times before finally quitting. You may have symptoms of withdrawal because your body is used to nicotine. You may crave cigarettes, be irritable, feel very hungry, cough often, get headaches, or have difficulty concentrating. The withdrawal symptoms are only temporary. They are strongest when you first quit, but they will go away within 10-14 days. To reduce the   chances of relapse, try to:  Avoid drinking alcohol. Drinking lowers your chances of successfully quitting.  Reduce the amount of caffeine you consume. Once you quit smoking, the amount of caffeine in your body increases and can give you symptoms, such as a rapid heartbeat, sweating, and anxiety.  Avoid smokers because they can make you want to smoke.  Do not let weight gain distract you. Many smokers will gain weight when they quit, usually less than 10 pounds. Eat a healthy diet and stay active. You can always lose the weight gained after you quit.  Find ways to improve your mood other than smoking. FOR MORE INFORMATION  www.smokefree.gov  Document Released:  11/19/2001 Document Revised: 04/11/2014 Document Reviewed: 03/05/2012 ExitCare Patient Information 2015 [employer]. This information is not intended to replace advice given to you by your health care provider. Make sure you discuss any questions you have with your health care provider.  

## 2014-08-16 NOTE — Progress Notes (Signed)
Patient ID: Vincent [name], male   DOB: 1961-02-15, 53 y.o.   MRN: [id number]  CC: headaches   HPI:  Patient reports that for the past month he has been having daily headaches. He states that sometimes with the headaches he often has dizzy spells that cause him to break out in a sweat.  He reports that the headaches usually begins in the morning and may last all day.  The pain is described a sharp shooting pain that shoots to his right eye.  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    No Known Allergies Past Medical History  Diagnosis Date  . Glaucoma   . Hypertension    Current Outpatient Prescriptions on File Prior to Visit  Medication Sig Dispense Refill  . amitriptyline (ELAVIL) 50 MG tablet Take 1 tablet (50 mg total) by mouth at bedtime.  30 tablet  3  . diazepam (VALIUM) 2 MG tablet Take 1 tablet (2 mg total) by mouth every 8 (eight) hours as needed for anxiety.  30 tablet  0  . gabapentin (NEURONTIN) 600 MG tablet Take 1 tablet (600 mg total) by mouth daily.  30 tablet  3  . methocarbamol (ROBAXIN) 500 MG tablet Take 1 tablet (500 mg total) by mouth 4 (four) times daily as needed (muscle spasms).  30 tablet  1  . naproxen (NAPROSYN) 500 MG tablet Take 1 tablet (500 mg total) by mouth 2 (two) times daily as needed (pain,  take with meals).  30 tablet  3  . traMADol (ULTRAM) 50 MG tablet Take 1 tablet (50 mg total) by mouth every 8 (eight) hours as needed for pain.  30 tablet  3  . carbamazepine (TEGRETOL) 200 MG tablet Take 1 tablet (200 mg total) by mouth 2 (two) times daily.  60 tablet  3  . oxyCODONE-acetaminophen (PERCOCET/ROXICET) 5-325 MG per tablet Take 1 tablet by mouth every 4 (four) hours as needed for pain.      . predniSONE (DELTASONE) 20 MG tablet Take 2 tablets (40 mg total) by mouth daily.  10 tablet  0   No current facility-administered medications on file prior to visit.   History reviewed. No pertinent family history. History   Social  History  . Marital Status: Married    Spouse Name: N/A    Number of Children: N/A  . Years of Education: N/A   Occupational History  . Not on file.   Social History Main Topics  . Smoking status: Current Every Day Smoker  . Smokeless tobacco: Never Used  . Alcohol Use: No  . Drug Use: No  . Sexual Activity: Not on file   Other Topics Concern  . Not on file   Social History Narrative  . No narrative on file   Review of Systems  Constitutional: Positive for weight loss (has lost 7 lbs over 3 months) and diaphoresis.  Eyes: Positive for pain (right eye-all day).       Blind in right eye   Respiratory: Positive for shortness of breath (smoker .5 ppd).   Cardiovascular: Negative.   Neurological: Positive for dizziness and headaches. Negative for tingling.     Objective:   Filed Vitals:   08/16/14 1407  BP: 163/96  Pulse: 107  Temp: 98.4 F (36.9 C)  Resp: 16   Physical Exam  Constitutional: He is oriented to person, place, and time.  HENT:  Right pupil, 4 sluggish reaction Left pupil 2  Neck: Normal range of motion.  Cardiovascular: Normal rate, regular rhythm and normal heart sounds.   Pulmonary/Chest: Effort normal and breath sounds normal.  Abdominal: Soft. Bowel sounds are normal.  Musculoskeletal: Normal range of motion. He exhibits no edema and no tenderness.  Neurological: He is alert and oriented to person, place, and time.     Lab Results  Component Value Date   WBC 4.4 09/20/2013   HGB 14.0 09/20/2013   HCT 41.0 09/20/2013   MCV 73.7* 09/20/2013   PLT 165 09/20/2013   Lab Results  Component Value Date   CREATININE 1.12 07/05/2013   BUN 8 07/05/2013   NA 138 07/05/2013   K 4.4 07/05/2013   CL 108 07/05/2013   CO2 25 07/05/2013    No results found for this basename: HGBA1C   Lipid Panel     Component Value Date/Time   CHOL 147 07/05/2013 1113   TRIG 68 07/05/2013 1113   HDL 48 07/05/2013 1113   CHOLHDL 3.1 07/05/2013 1113   VLDL 14  07/05/2013 1113   LDLCALC 85 07/05/2013 1113       Assessment and plan:   Vincent [name] was seen today for follow-up.  Diagnoses and associated orders for this visit:  Essential hypertension - Begin lisinopril (PRINIVIL,ZESTRIL) 10 MG tablet; Take 1 tablet (10 mg total) by mouth daily.  Smoker Smoking cessation discussed. Discussed how it can increase BP  Need for prophylactic vaccination and inoculation against influenza Received    Return in about 1 week (around 08/23/2014) for Nurse Visit-BP check. If BP is still high, may increase lisinopril to 20 mg  Due to language barrier, an interpreter was present during the history-taking and subsequent discussion (and for part of the physical exam) with this patient.       Chari Manning, NP-C [facility] [phone] 08/16/2014, 2:25 PM

## 2014-08-23 ENCOUNTER — Ambulatory Visit: Payer: Medicare Other | Attending: Internal Medicine

## 2014-08-23 NOTE — Patient Instructions (Signed)
Your blood pressure is still uncontrolled. You are to increase your strength of the lisinopril to 20mg  a day. Return in 1 week to recheck your BP.

## 2014-08-23 NOTE — Progress Notes (Unsigned)
Pt is here today to check his BP after having a uncontrolled BP.

## 2014-08-23 NOTE — Progress Notes (Unsigned)
   Subjective:    Patient ID: Vincent [name], male    DOB: 03-15-1961, 53 y.o.   MRN: [id number]  HPI    Review of Systems     Objective:   Physical Exam        Assessment & Plan:

## 2014-08-30 ENCOUNTER — Telehealth: Payer: Self-pay | Admitting: *Deleted

## 2014-08-30 ENCOUNTER — Ambulatory Visit: Payer: Medicare HMO | Attending: Internal Medicine | Admitting: *Deleted

## 2014-08-30 VITALS — BP 157/80 | HR 85 | Temp 98.2°F

## 2014-08-30 DIAGNOSIS — I10 Essential (primary) hypertension: Secondary | ICD-10-CM | POA: Diagnosis present

## 2014-08-30 DIAGNOSIS — F17200 Nicotine dependence, unspecified, uncomplicated: Secondary | ICD-10-CM | POA: Diagnosis not present

## 2014-08-30 MED ORDER — NICOTINE 14 MG/24HR TD PT24: 14.0000 mg | MEDICATED_PATCH | Freq: Every day | TRANSDERMAL | Status: DC

## 2014-08-30 MED ORDER — LISINOPRIL 20 MG PO TABS: 20.0000 mg | ORAL_TABLET | Freq: Every day | ORAL | Status: DC

## 2014-08-30 NOTE — Patient Instructions (Signed)
Smoking Cessation Quitting smoking is important to your health and has many advantages. However, it is not always easy to quit since nicotine is a very addictive drug. Oftentimes, people try 3 times or more before being able to quit. This document explains the best ways for you to prepare to quit smoking. Quitting takes hard work and a lot of effort, but you can do it. ADVANTAGES OF QUITTING SMOKING  You will live longer, feel better, and live better.  Your body will feel the impact of quitting smoking almost immediately.  Within 20 minutes, blood pressure decreases. Your pulse returns to its normal level.  After 8 hours, carbon monoxide levels in the blood return to normal. Your oxygen level increases.  After 24 hours, the chance of having a heart attack starts to decrease. Your breath, hair, and body stop smelling like smoke.  After 48 hours, damaged nerve endings begin to recover. Your sense of taste and smell improve.  After 72 hours, the body is virtually free of nicotine. Your bronchial tubes relax and breathing becomes easier.  After 2 to 12 weeks, lungs can hold more air. Exercise becomes easier and circulation improves.  The risk of having a heart attack, stroke, cancer, or lung disease is greatly reduced.  After 1 year, the risk of coronary heart disease is cut in half.  After 5 years, the risk of stroke falls to the same as a nonsmoker.  After 10 years, the risk of lung cancer is cut in half and the risk of other cancers decreases significantly.  After 15 years, the risk of coronary heart disease drops, usually to the level of a nonsmoker.  If you are pregnant, quitting smoking will improve your chances of having a healthy baby.  The people you live with, especially any children, will be healthier.  You will have extra money to spend on things other than cigarettes. QUESTIONS TO THINK ABOUT BEFORE ATTEMPTING TO QUIT You may want to talk about your answers with your  health care provider.  Why do you want to quit?  If you tried to quit in the past, what helped and what did not?  What will be the most difficult situations for you after you quit? How will you plan to handle them?  Who can help you through the tough times? Your family? Friends? A health care provider?  What pleasures do you get from smoking? What ways can you still get pleasure if you quit? Here are some questions to ask your health care provider:  How can you help me to be successful at quitting?  What medicine do you think would be best for me and how should I take it?  What should I do if I need more help?  What is smoking withdrawal like? How can I get information on withdrawal? GET READY  Set a quit date.  Change your environment by getting rid of all cigarettes, ashtrays, matches, and lighters in your home, car, or work. Do not let people smoke in your home.  Review your past attempts to quit. Think about what worked and what did not. GET SUPPORT AND ENCOURAGEMENT You have a better chance of being successful if you have help. You can get support in many ways.  Tell your family, friends, and coworkers that you are going to quit and need their support. Ask them not to smoke around you.  Get individual, group, or telephone counseling and support. Programs are available at local hospitals and health centers. Call   your local health department for information about programs in your area.  Spiritual beliefs and practices may help some smokers quit.  Download a "quit meter" on your computer to keep track of quit statistics, such as how long you have gone without smoking, cigarettes not smoked, and money saved.  Get a self-help book about quitting smoking and staying off tobacco. LEARN NEW SKILLS AND BEHAVIORS  Distract yourself from urges to smoke. Talk to someone, go for a walk, or occupy your time with a task.  Change your normal routine. Take a different route to work.  Drink tea instead of coffee. Eat breakfast in a different place.  Reduce your stress. Take a hot bath, exercise, or read a book.  Plan something enjoyable to do every day. Reward yourself for not smoking.  Explore interactive web-based programs that specialize in helping you quit. GET MEDICINE AND USE IT CORRECTLY Medicines can help you stop smoking and decrease the urge to smoke. Combining medicine with the above behavioral methods and support can greatly increase your chances of successfully quitting smoking.  Nicotine replacement therapy helps deliver nicotine to your body without the negative effects and risks of smoking. Nicotine replacement therapy includes nicotine gum, lozenges, inhalers, nasal sprays, and skin patches. Some may be available over-the-counter and others require a prescription.  Antidepressant medicine helps people abstain from smoking, but how this works is unknown. This medicine is available by prescription.  Nicotinic receptor partial agonist medicine simulates the effect of nicotine in your brain. This medicine is available by prescription. Ask your health care provider for advice about which medicines to use and how to use them based on your health history. Your health care provider will tell you what side effects to look out for if you choose to be on a medicine or therapy. Carefully read the information on the package. Do not use any other product containing nicotine while using a nicotine replacement product.  RELAPSE OR DIFFICULT SITUATIONS Most relapses occur within the first 3 months after quitting. Do not be discouraged if you start smoking again. Remember, most people try several times before finally quitting. You may have symptoms of withdrawal because your body is used to nicotine. You may crave cigarettes, be irritable, feel very hungry, cough often, get headaches, or have difficulty concentrating. The withdrawal symptoms are only temporary. They are strongest  when you first quit, but they will go away within 10-14 days. To reduce the chances of relapse, try to:  Avoid drinking alcohol. Drinking lowers your chances of successfully quitting.  Reduce the amount of caffeine you consume. Once you quit smoking, the amount of caffeine in your body increases and can give you symptoms, such as a rapid heartbeat, sweating, and anxiety.  Avoid smokers because they can make you want to smoke.  Do not let weight gain distract you. Many smokers will gain weight when they quit, usually less than 10 pounds. Eat a healthy diet and stay active. You can always lose the weight gained after you quit.  Find ways to improve your mood other than smoking. FOR MORE INFORMATION  www.smokefree.gov  Document Released: 11/19/2001 Document Revised: 04/11/2014 Document Reviewed: 03/05/2012 [employer] Patient Information 2015 [employer]. This information is not intended to replace advice given to you by your health care provider. Make sure you discuss any questions you have with your health care provider.  

## 2014-08-30 NOTE — Progress Notes (Signed)
Patient presens with wife for BP recheck after increasing lisinopril to 20 mg daily. States since starting lisinopril he feels dizzy 2-3 times per day if he stands too quickly. Instructed to sit at end of bed/chair for a few seconds before standing.  BP 157/80 P 85 T 98.2 SPO2 100%  Patient states he had a cigarette about 30 minutes ago. Patient is interested in quitting smoking Information on smoking cessation given.  Will review with PCP and call patient.  Please see telephone encounter from 08/30/14

## 2014-08-30 NOTE — Telephone Encounter (Signed)
Patient notified that provider wants him to continue lisinopril 20 mg daily and has e-scribed rx for this and nicotine patch to [facility] on [address] Patient instructed to call for refill at least 7 days prior to running out of BP med so as not to go without Patient is aware that he should make 3 month f/u appt with PCP around 11/15/14

## 2014-09-06 ENCOUNTER — Other Ambulatory Visit: Payer: Self-pay | Admitting: Internal Medicine

## 2014-09-06 MED ORDER — LISINOPRIL 20 MG PO TABS: 20.0000 mg | ORAL_TABLET | Freq: Every day | ORAL | Status: DC

## 2014-09-21 ENCOUNTER — Ambulatory Visit (INDEPENDENT_AMBULATORY_CARE_PROVIDER_SITE_OTHER): Payer: Medicare HMO | Admitting: Internal Medicine

## 2014-09-21 ENCOUNTER — Encounter: Payer: Self-pay | Admitting: Internal Medicine

## 2014-09-21 VITALS — BP 150/72 | HR 80 | Temp 97.9°F | Resp 18 | Ht 67.0 in | Wt 133.4 lb

## 2014-09-21 DIAGNOSIS — Z0000 Encounter for general adult medical examination without abnormal findings: Secondary | ICD-10-CM

## 2014-09-21 DIAGNOSIS — I10 Essential (primary) hypertension: Secondary | ICD-10-CM

## 2014-09-21 DIAGNOSIS — F17200 Nicotine dependence, unspecified, uncomplicated: Secondary | ICD-10-CM

## 2014-09-21 DIAGNOSIS — Z720 Tobacco use: Secondary | ICD-10-CM

## 2014-09-21 DIAGNOSIS — M542 Cervicalgia: Secondary | ICD-10-CM

## 2014-09-21 MED ORDER — HYDROCHLOROTHIAZIDE 12.5 MG PO CAPS: 12.5000 mg | ORAL_CAPSULE | Freq: Every day | ORAL | Status: DC

## 2014-09-21 MED ORDER — NAPROXEN 250 MG PO TABS: 250.0000 mg | ORAL_TABLET | Freq: Two times a day (BID) | ORAL | Status: DC

## 2014-09-21 NOTE — Assessment & Plan Note (Signed)
Patient on lisinopril 20 mg at this time and will add HCTZ 12.5 mg daily for BP still above goal and return in 4 weeks for labs and BP check. Unclear if headaches are related because he is taking chronic OTC analgesics for his back and could be rebound headaches.

## 2014-09-21 NOTE — Progress Notes (Signed)
Pre visit review using our clinic review tool, if applicable. No additional management support is needed unless otherwise documented below in the visit note. 

## 2014-09-21 NOTE — Progress Notes (Signed)
   Subjective:    Patient ID: Vincent [name], male    DOB: 23-Aug-1961, 53 y.o.   MRN: [id number]  HPI The patient is a 53 YO man who is coming in today to establish care. He has PMH of HTN, injury at work with resultant back surgery. He is still having some pain in his fingers that he associates with the injury. He has not followed up with his surgeon in some time. He denies fevers, chills. He is having some headaches daily. He is a current 1PPD smoker and is thinking about quitting. He was prescribed nicotine patches but has not filled those yet. He denies chest pains, problems with urination, SOB, abdominal pain, confusion, weakness. He thinks he may have seen some dark stools but he is not sure.  Review of Systems  Constitutional: Negative for fever, activity change, appetite change and fatigue.  Eyes: Negative.   Respiratory: Negative for cough, chest tightness, shortness of breath and wheezing.   Cardiovascular: Negative for chest pain, palpitations and leg swelling.  Gastrointestinal: Negative for abdominal pain, diarrhea, constipation and abdominal distention.  Musculoskeletal: Positive for arthralgias and back pain. Negative for gait problem and myalgias.  Skin: Negative.   Neurological: Positive for numbness and headaches. Negative for dizziness, weakness and light-headedness.      Objective:   Physical Exam  Vitals reviewed. Constitutional: He is oriented to person, place, and time. He appears well-developed and well-nourished. No distress.  HENT:  Head: Normocephalic and atraumatic.  Eyes: EOM are normal. Pupils are equal, round, and reactive to light.  Neck: Normal range of motion. No JVD present.  Cardiovascular: Normal rate and regular rhythm.   Pulmonary/Chest: Effort normal and breath sounds normal. No respiratory distress. He has no wheezes. He has no rales.  Abdominal: Soft. Bowel sounds are normal. He exhibits no distension. There is no tenderness. There is no rebound.    Neurological: He is alert and oriented to person, place, and time. No cranial nerve deficit. Coordination normal.  Skin: Skin is warm and dry.   Filed Vitals:   09/21/14 0851 09/21/14 0921  BP: 182/90 150/72  Pulse: 80   Temp: 97.9 F (36.6 C)   TempSrc: Oral   Resp: 18   Height: 5\' 7"  (1.702 m)   Weight: 133 lb 6.4 oz (60.51 kg)       Assessment & Plan:

## 2014-09-21 NOTE — Patient Instructions (Signed)
We will give you another blood pressure medication called HCTZ (also called hydrochlorothiazide). Take 1 pill per day.   We will have you try back the naproxen for your back and advise you to use heating pad on it when it hurts. If you are still having pain we would like you to go back to the surgeon who did you neck surgery to talk to them about it.   Come back in 1 month to check on your blood pressure and to get some lab work.   Back Exercises Back exercises help treat and prevent back injuries. The goal is to increase your strength in your belly (abdominal) and back muscles. These exercises can also help with flexibility. Start these exercises when told by your doctor. HOME CARE Back exercises include: Pelvic Tilt.  Lie on your back with your knees bent. Tilt your pelvis until the lower part of your back is against the floor. Hold this position 5 to 10 sec. Repeat this exercise 5 to 10 times. Knee to Chest.  Pull 1 knee up against your chest and hold for 20 to 30 seconds. Repeat this with the other knee. This may be done with the other leg straight or bent, whichever feels better. Then, pull both knees up against your chest. Sit-Ups or Curl-Ups.  Bend your knees 90 degrees. Start with tilting your pelvis, and do a partial, slow sit-up. Only lift your upper half 30 to 45 degrees off the floor. Take at least 2 to 3 seonds for each sit-up. Do not do sit-ups with your knees out straight. If partial sit-ups are difficult, simply do the above but with only tightening your belly (abdominal) muscles and holding it as told. Hip-Lift.  Lie on your back with your knees flexed 90 degrees. Push down with your feet and shoulders as you raise your hips 2 inches off the floor. Hold for 10 seconds, repeat 5 to 10 times. Back Arches.  Lie on your stomach. Prop yourself up on bent elbows. Slowly press on your hands, causing an arch in your low back. Repeat 3 to 5 times. Shoulder-Lifts.  Lie face down  with arms beside your body. Keep hips and belly pressed to floor as you slowly lift your head and shoulders off the floor. Do not overdo your exercises. Be careful in the beginning. Exercises may cause you some mild back discomfort. If the pain lasts for more than 15 minutes, stop the exercises until you see your doctor. Improvement with exercise for back problems is slow.  Document Released: 12/28/2010 Document Revised: 02/17/2012 Document Reviewed: 09/26/2011 [facility] Patient Information 2015 [place], [place]. This information is not intended to replace advice given to you by your health care provider. Make sure you discuss any questions you have with your health care provider.

## 2014-09-21 NOTE — Assessment & Plan Note (Signed)
Advised cessation and warned about the additive risk of smoking and high blood pressure and encouraged him to fill nicotine patches and try to quit. He will think about it.

## 2014-09-21 NOTE — Assessment & Plan Note (Signed)
Patient thinks he has had colonoscopy but cannot remember what year, will try to obtain records. Already had flu shot. Will work on BP first and address more health maintenance at next visit.

## 2014-09-21 NOTE — Assessment & Plan Note (Signed)
S/P work injury and neck surgery (he thinks in 2009) and having some pain and tingling in his fingers. Have advised him to follow back up with his surgeon. He is also having some paraspinal tenderness that I believe to be muscular in nature and advised him to use naproxen and to use heating pads for the pain.

## 2014-10-19 ENCOUNTER — Other Ambulatory Visit (INDEPENDENT_AMBULATORY_CARE_PROVIDER_SITE_OTHER): Payer: Medicare HMO

## 2014-10-19 ENCOUNTER — Encounter: Payer: Self-pay | Admitting: Internal Medicine

## 2014-10-19 ENCOUNTER — Ambulatory Visit (INDEPENDENT_AMBULATORY_CARE_PROVIDER_SITE_OTHER): Payer: Medicare HMO | Admitting: Internal Medicine

## 2014-10-19 VITALS — BP 148/78 | HR 100 | Temp 98.5°F | Resp 16 | Ht 67.0 in | Wt 133.0 lb

## 2014-10-19 DIAGNOSIS — I10 Essential (primary) hypertension: Secondary | ICD-10-CM

## 2014-10-19 DIAGNOSIS — Z720 Tobacco use: Secondary | ICD-10-CM

## 2014-10-19 DIAGNOSIS — F17200 Nicotine dependence, unspecified, uncomplicated: Secondary | ICD-10-CM

## 2014-10-19 LAB — BASIC METABOLIC PANEL
BUN: 11 mg/dL (ref 6–23)
CHLORIDE: 102 meq/L (ref 96–112)
CO2: 25 meq/L (ref 19–32)
Calcium: 9.3 mg/dL (ref 8.4–10.5)
Creatinine, Ser: 1.1 mg/dL (ref 0.4–1.5)
GFR: 89.01 mL/min (ref >60.00)
GLUCOSE: 103 mg/dL — AB (ref 70–99)
Potassium: 4 mEq/L (ref 3.5–5.1)
SODIUM: 135 meq/L (ref 135–145)

## 2014-10-19 MED ORDER — HYDROCHLOROTHIAZIDE 12.5 MG PO CAPS: 12.5000 mg | ORAL_CAPSULE | Freq: Every day | ORAL | Status: DC

## 2014-10-19 NOTE — Progress Notes (Signed)
   Subjective:    Patient ID: Vincent [name], male    DOB: [birth year], 53 y.o.   MRN: [id number]  HPI The patient is a 53 year old man who comes in today for follow-up of his blood pressure. We did change his blood pressure medications at last visit. He's had no problems with medication. Denies dizziness, headache, chest pains. He is still smoking and was unable to afford the nicotine patches although he wishes he could quit smoking. He has no new complaints.  Review of Systems  Constitutional: Negative for fever, activity change, appetite change and fatigue.  Eyes: Negative.   Respiratory: Negative for cough, chest tightness, shortness of breath and wheezing.   Cardiovascular: Negative for chest pain, palpitations and leg swelling.  Gastrointestinal: Negative for abdominal pain, diarrhea, constipation and abdominal distention.  Musculoskeletal: Positive for arthralgias. Negative for myalgias and gait problem.  Skin: Negative.   Neurological: Negative for dizziness, weakness, light-headedness and numbness.      Objective:   Physical Exam  Constitutional: He is oriented to person, place, and time. He appears well-developed and well-nourished. No distress.  HENT:  Head: Normocephalic and atraumatic.  Eyes: EOM are normal. Pupils are equal, round, and reactive to light.  Neck: Normal range of motion. No JVD present.  Cardiovascular: Normal rate and regular rhythm.   Pulmonary/Chest: Effort normal and breath sounds normal. No respiratory distress. He has no wheezes. He has no rales.  Abdominal: Soft. Bowel sounds are normal. He exhibits no distension. There is no tenderness. There is no rebound.  Neurological: He is alert and oriented to person, place, and time. No cranial nerve deficit. Coordination normal.  Skin: Skin is warm and dry.  Vitals reviewed.  Filed Vitals:   10/19/14 1001  BP: 148/78  Pulse: 100  Temp: 98.5 F (36.9 C)  TempSrc: Oral  Resp: 16  Height: 5\' 7"  (1.702 m)    Weight: 133 lb (60.328 kg)  SpO2: 99%      Assessment & Plan:

## 2014-10-19 NOTE — Assessment & Plan Note (Signed)
BP doing much better. He is currently only admitting to taking hydrochlorothiazide. Not taking lisinopril at this time. We'll send in refills for 90 days. Check basic metabolic panel at today's visit.

## 2014-10-19 NOTE — Patient Instructions (Signed)
Your blood pressure is doing better on the new medicine. We will check your blood work today to make sure there are no problems from the medicine.  If you are feeling well we will see back in about 6-8 months, if you have any problems or questions please feel free to call our office before then.

## 2014-10-19 NOTE — Progress Notes (Signed)
Pre visit review using our clinic review tool, if applicable. No additional management support is needed unless otherwise documented below in the visit note. 

## 2014-10-19 NOTE — Assessment & Plan Note (Signed)
He is still smoking a little bit and finding it difficult to quit. He is unable to buy the nicotine patches at this time.

## 2015-02-17 ENCOUNTER — Ambulatory Visit: Payer: Medicare HMO | Admitting: Internal Medicine

## 2015-04-07 ENCOUNTER — Inpatient Hospital Stay (HOSPITAL_COMMUNITY): Payer: Medicare HMO

## 2015-04-07 ENCOUNTER — Other Ambulatory Visit (INDEPENDENT_AMBULATORY_CARE_PROVIDER_SITE_OTHER): Payer: Medicare HMO

## 2015-04-07 ENCOUNTER — Ambulatory Visit (INDEPENDENT_AMBULATORY_CARE_PROVIDER_SITE_OTHER)
Admission: RE | Admit: 2015-04-07 | Discharge: 2015-04-07 | Disposition: A | Discharge status: Home / Self Care | Payer: Medicare HMO | Source: Ambulatory Visit | Attending: Internal Medicine | Admitting: Internal Medicine

## 2015-04-07 ENCOUNTER — Ambulatory Visit (INDEPENDENT_AMBULATORY_CARE_PROVIDER_SITE_OTHER): Payer: Medicare HMO | Admitting: Internal Medicine

## 2015-04-07 ENCOUNTER — Inpatient Hospital Stay (HOSPITAL_COMMUNITY)
Admission: EM | Admit: 2015-04-07 | Discharge: 2015-04-13 | DRG: 871 | Disposition: A | Discharge status: Home / Self Care | Payer: Medicare HMO | Attending: Internal Medicine | Admitting: Internal Medicine

## 2015-04-07 ENCOUNTER — Encounter: Payer: Self-pay | Admitting: Internal Medicine

## 2015-04-07 ENCOUNTER — Encounter (HOSPITAL_COMMUNITY): Payer: Self-pay | Admitting: Emergency Medicine

## 2015-04-07 VITALS — BP 118/56 | HR 125 | Temp 99.2°F | Resp 16 | Ht 67.0 in | Wt 125.8 lb

## 2015-04-07 DIAGNOSIS — I10 Essential (primary) hypertension: Secondary | ICD-10-CM | POA: Diagnosis present

## 2015-04-07 DIAGNOSIS — D649 Anemia, unspecified: Secondary | ICD-10-CM | POA: Insufficient documentation

## 2015-04-07 DIAGNOSIS — R634 Abnormal weight loss: Secondary | ICD-10-CM

## 2015-04-07 DIAGNOSIS — D62 Acute posthemorrhagic anemia: Secondary | ICD-10-CM | POA: Diagnosis present

## 2015-04-07 DIAGNOSIS — F17210 Nicotine dependence, cigarettes, uncomplicated: Secondary | ICD-10-CM | POA: Diagnosis present

## 2015-04-07 DIAGNOSIS — A419 Sepsis, unspecified organism: Secondary | ICD-10-CM | POA: Diagnosis not present

## 2015-04-07 DIAGNOSIS — Z6820 Body mass index (BMI) 20.0-20.9, adult: Secondary | ICD-10-CM | POA: Diagnosis not present

## 2015-04-07 DIAGNOSIS — R040 Epistaxis: Secondary | ICD-10-CM | POA: Diagnosis not present

## 2015-04-07 DIAGNOSIS — R059 Cough, unspecified: Secondary | ICD-10-CM

## 2015-04-07 DIAGNOSIS — E876 Hypokalemia: Secondary | ICD-10-CM | POA: Diagnosis present

## 2015-04-07 DIAGNOSIS — R05 Cough: Secondary | ICD-10-CM

## 2015-04-07 DIAGNOSIS — R0600 Dyspnea, unspecified: Secondary | ICD-10-CM

## 2015-04-07 DIAGNOSIS — D509 Iron deficiency anemia, unspecified: Secondary | ICD-10-CM | POA: Diagnosis present

## 2015-04-07 DIAGNOSIS — J80 Acute respiratory distress syndrome: Secondary | ICD-10-CM | POA: Diagnosis not present

## 2015-04-07 DIAGNOSIS — A409 Streptococcal sepsis, unspecified: Secondary | ICD-10-CM | POA: Diagnosis not present

## 2015-04-07 DIAGNOSIS — J9600 Acute respiratory failure, unspecified whether with hypoxia or hypercapnia: Secondary | ICD-10-CM | POA: Diagnosis present

## 2015-04-07 DIAGNOSIS — J154 Pneumonia due to other streptococci: Secondary | ICD-10-CM | POA: Diagnosis present

## 2015-04-07 DIAGNOSIS — A403 Sepsis due to Streptococcus pneumoniae: Secondary | ICD-10-CM | POA: Diagnosis not present

## 2015-04-07 DIAGNOSIS — J189 Pneumonia, unspecified organism: Secondary | ICD-10-CM | POA: Diagnosis present

## 2015-04-07 DIAGNOSIS — J440 Chronic obstructive pulmonary disease with acute lower respiratory infection: Secondary | ICD-10-CM | POA: Diagnosis present

## 2015-04-07 DIAGNOSIS — J441 Chronic obstructive pulmonary disease with (acute) exacerbation: Secondary | ICD-10-CM | POA: Diagnosis present

## 2015-04-07 DIAGNOSIS — J449 Chronic obstructive pulmonary disease, unspecified: Secondary | ICD-10-CM | POA: Diagnosis present

## 2015-04-07 DIAGNOSIS — E43 Unspecified severe protein-calorie malnutrition: Secondary | ICD-10-CM | POA: Diagnosis present

## 2015-04-07 DIAGNOSIS — H409 Unspecified glaucoma: Secondary | ICD-10-CM | POA: Diagnosis present

## 2015-04-07 DIAGNOSIS — F17200 Nicotine dependence, unspecified, uncomplicated: Secondary | ICD-10-CM | POA: Diagnosis present

## 2015-04-07 DIAGNOSIS — R0603 Acute respiratory distress: Secondary | ICD-10-CM | POA: Diagnosis present

## 2015-04-07 HISTORY — DX: Nicotine dependence, unspecified, uncomplicated: F17.200

## 2015-04-07 LAB — EXPECTORATED SPUTUM ASSESSMENT W REFEX TO RESP CULTURE

## 2015-04-07 LAB — COMPREHENSIVE METABOLIC PANEL
ALBUMIN: 4.1 g/dL (ref 3.5–5.2)
ALK PHOS: 67 U/L (ref 39–117)
ALT: 16 U/L (ref 0–53)
ALT: 18 U/L (ref 0–53)
AST: 30 U/L (ref 0–37)
AST: 38 U/L — ABNORMAL HIGH (ref 0–37)
Albumin: 4 g/dL (ref 3.5–5.2)
Alkaline Phosphatase: 71 U/L (ref 39–117)
Anion gap: 12 (ref 5–15)
BUN: 5 mg/dL — AB (ref 6–23)
BUN: 7 mg/dL (ref 6–23)
CALCIUM: 9.6 mg/dL (ref 8.4–10.5)
CO2: 21 meq/L (ref 19–32)
CO2: 19 mmol/L (ref 19–32)
CREATININE: 0.88 mg/dL (ref 0.40–1.50)
CREATININE: 0.92 mg/dL (ref 0.50–1.35)
Calcium: 9 mg/dL (ref 8.4–10.5)
Chloride: 104 mEq/L (ref 96–112)
Chloride: 106 mmol/L (ref 96–112)
GFR calc Af Amer: >90 mL/min (ref >90)
GFR calc non Af Amer: >90 mL/min (ref >90)
GFR: 116.16 mL/min (ref >60.00)
GLUCOSE: 108 mg/dL — AB (ref 70–99)
Glucose, Bld: 94 mg/dL (ref 70–99)
POTASSIUM: 4.1 mmol/L (ref 3.5–5.1)
Potassium: 4.4 mEq/L (ref 3.5–5.1)
Sodium: 135 mEq/L (ref 135–145)
Sodium: 137 mmol/L (ref 135–145)
Total Bilirubin: 0.6 mg/dL (ref 0.2–1.2)
Total Bilirubin: 0.7 mg/dL (ref 0.3–1.2)
Total Protein: 8.7 g/dL — ABNORMAL HIGH (ref 6.0–8.3)
Total Protein: 8.7 g/dL — ABNORMAL HIGH (ref 6.0–8.3)

## 2015-04-07 LAB — MRSA PCR SCREENING: MRSA BY PCR: NEGATIVE

## 2015-04-07 LAB — CBC
HCT: 23 % — CL (ref 39.0–52.0)
MCHC: 29.6 g/dL — AB (ref 30.0–36.0)
MCV: 60.6 fl — ABNORMAL LOW (ref 78.0–100.0)
PLATELETS: 286 10*3/uL (ref 150.0–400.0)
RBC: 3.8 Mil/uL — AB (ref 4.22–5.81)
RDW: 24 % — ABNORMAL HIGH (ref 11.5–15.5)
WBC: 11.4 10*3/uL — ABNORMAL HIGH (ref 4.0–10.5)

## 2015-04-07 LAB — RAPID URINE DRUG SCREEN, HOSP PERFORMED
Amphetamines: NOT DETECTED
Barbiturates: NOT DETECTED
Benzodiazepines: NOT DETECTED
Cocaine: NOT DETECTED
OPIATES: NOT DETECTED
Tetrahydrocannabinol: POSITIVE — AB

## 2015-04-07 LAB — CBC WITH DIFFERENTIAL/PLATELET
BASOS PCT: 1 % (ref 0–1)
Basophils Absolute: 0.1 10*3/uL (ref 0.0–0.1)
Eosinophils Absolute: 0 10*3/uL (ref 0.0–0.7)
Eosinophils Relative: 0 % (ref 0–5)
HEMATOCRIT: 23.1 % — AB (ref 39.0–52.0)
HEMOGLOBIN: 6.4 g/dL — AB (ref 13.0–17.0)
Lymphocytes Relative: 9 % — ABNORMAL LOW (ref 12–46)
Lymphs Abs: 1.2 10*3/uL (ref 0.7–4.0)
MCH: 17.3 pg — AB (ref 26.0–34.0)
MCHC: 27.7 g/dL — AB (ref 30.0–36.0)
MCV: 62.4 fL — ABNORMAL LOW (ref 78.0–100.0)
Monocytes Absolute: 1 10*3/uL (ref 0.1–1.0)
Monocytes Relative: 8 % (ref 3–12)
NEUTROS PCT: 82 % — AB (ref 43–77)
Neutro Abs: 10.8 10*3/uL — ABNORMAL HIGH (ref 1.7–7.7)
Platelets: 290 10*3/uL (ref 150–400)
RBC: 3.7 MIL/uL — ABNORMAL LOW (ref 4.22–5.81)
RDW: 21.7 % — ABNORMAL HIGH (ref 11.5–15.5)
WBC: 13.1 10*3/uL — ABNORMAL HIGH (ref 4.0–10.5)

## 2015-04-07 LAB — PROTIME-INR
INR: 1.12 (ref 0.00–1.49)
INR: 1.11 (ref 0.00–1.49)
PROTHROMBIN TIME: 14.5 s (ref 11.6–15.2)
Prothrombin Time: 14.4 seconds (ref 11.6–15.2)

## 2015-04-07 LAB — URINALYSIS, ROUTINE W REFLEX MICROSCOPIC
Bilirubin Urine: NEGATIVE
GLUCOSE, UA: NEGATIVE mg/dL
Hgb urine dipstick: NEGATIVE
Ketones, ur: NEGATIVE mg/dL
LEUKOCYTES UA: NEGATIVE
NITRITE: NEGATIVE
PROTEIN: NEGATIVE mg/dL
Specific Gravity, Urine: 1.02 (ref 1.005–1.030)
Urobilinogen, UA: 0.2 mg/dL (ref 0.0–1.0)
pH: 5 (ref 5.0–8.0)

## 2015-04-07 LAB — LACTIC ACID, PLASMA
Lactic Acid, Venous: 2 mmol/L (ref 0.5–2.0)
Lactic Acid, Venous: 2.1 mmol/L (ref 0.5–2.0)

## 2015-04-07 LAB — ABO/RH: ABO/RH(D): A POS

## 2015-04-07 LAB — EXPECTORATED SPUTUM ASSESSMENT W GRAM STAIN, RFLX TO RESP C

## 2015-04-07 LAB — PROCALCITONIN

## 2015-04-07 LAB — TSH
TSH: 0.61 u[IU]/mL (ref 0.35–4.50)
TSH: 1.92 u[IU]/mL (ref 0.350–4.500)

## 2015-04-07 LAB — RETICULOCYTES
RBC.: 3.89 MIL/uL — ABNORMAL LOW (ref 4.22–5.81)
RETIC CT PCT: 1.4 % (ref 0.4–3.1)
Retic Count, Absolute: 54.5 10*3/uL (ref 19.0–186.0)

## 2015-04-07 LAB — MAGNESIUM: MAGNESIUM: 1.9 mg/dL (ref 1.5–2.5)

## 2015-04-07 LAB — PREPARE RBC (CROSSMATCH)

## 2015-04-07 LAB — PHOSPHORUS: PHOSPHORUS: 3.1 mg/dL (ref 2.3–4.6)

## 2015-04-07 LAB — T4, FREE: FREE T4: 0.83 ng/dL (ref 0.60–1.60)

## 2015-04-07 LAB — ETHANOL

## 2015-04-07 MED ORDER — ALBUTEROL SULFATE (2.5 MG/3ML) 0.083% IN NEBU
2.5000 mg | INHALATION_SOLUTION | Freq: Once | RESPIRATORY_TRACT | Status: AC
  Administered 2015-04-07: 2.5 mg via RESPIRATORY_TRACT
  Filled 2015-04-07: qty 3

## 2015-04-07 MED ORDER — PIPERACILLIN-TAZOBACTAM 3.375 G IVPB 30 MIN
3.3750 g | Freq: Once | INTRAVENOUS | Status: DC
  Filled 2015-04-07: qty 50

## 2015-04-07 MED ORDER — SODIUM CHLORIDE 0.9 % IV SOLN
INTRAVENOUS | Status: DC
  Administered 2015-04-09: via INTRAVENOUS

## 2015-04-07 MED ORDER — PANTOPRAZOLE SODIUM 40 MG IV SOLR
40.0000 mg | Freq: Two times a day (BID) | INTRAVENOUS | Status: DC
  Administered 2015-04-07 – 2015-04-10 (×6): 40 mg via INTRAVENOUS
  Filled 2015-04-07 (×6): qty 40

## 2015-04-07 MED ORDER — ONDANSETRON HCL 4 MG/2ML IJ SOLN: 4.0000 mg | Freq: Four times a day (QID) | INTRAMUSCULAR | Status: DC | PRN

## 2015-04-07 MED ORDER — LEVALBUTEROL HCL 1.25 MG/0.5ML IN NEBU
1.2500 mg | INHALATION_SOLUTION | Freq: Four times a day (QID) | RESPIRATORY_TRACT | Status: DC
  Administered 2015-04-07 – 2015-04-08 (×2): 1.25 mg via RESPIRATORY_TRACT
  Filled 2015-04-07: qty 0.5

## 2015-04-07 MED ORDER — SODIUM CHLORIDE 0.9 % IV SOLN
10.0000 mL/h | Freq: Once | INTRAVENOUS | Status: AC
  Administered 2015-04-07: 10 mL/h via INTRAVENOUS

## 2015-04-07 MED ORDER — MORPHINE SULFATE 2 MG/ML IJ SOLN
2.0000 mg | INTRAMUSCULAR | Status: DC | PRN
  Administered 2015-04-07 – 2015-04-13 (×13): 2 mg via INTRAVENOUS
  Filled 2015-04-07 (×14): qty 1

## 2015-04-07 MED ORDER — PIPERACILLIN-TAZOBACTAM 3.375 G IVPB
3.3750 g | Freq: Three times a day (TID) | INTRAVENOUS | Status: DC
  Administered 2015-04-08 – 2015-04-10 (×9): 3.375 g via INTRAVENOUS
  Filled 2015-04-07 (×8): qty 50

## 2015-04-07 MED ORDER — LEVALBUTEROL HCL 1.25 MG/0.5ML IN NEBU
1.2500 mg | INHALATION_SOLUTION | RESPIRATORY_TRACT | Status: DC | PRN
  Filled 2015-04-07 (×2): qty 0.5

## 2015-04-07 MED ORDER — ONDANSETRON HCL 4 MG PO TABS: 4.0000 mg | ORAL_TABLET | Freq: Four times a day (QID) | ORAL | Status: DC | PRN

## 2015-04-07 MED ORDER — VANCOMYCIN HCL IN DEXTROSE 750-5 MG/150ML-% IV SOLN
750.0000 mg | Freq: Two times a day (BID) | INTRAVENOUS | Status: DC
  Administered 2015-04-08 – 2015-04-10 (×4): 750 mg via INTRAVENOUS
  Filled 2015-04-07 (×7): qty 150

## 2015-04-07 MED ORDER — VANCOMYCIN HCL IN DEXTROSE 1-5 GM/200ML-% IV SOLN
1000.0000 mg | Freq: Once | INTRAVENOUS | Status: AC
  Administered 2015-04-07: 1000 mg via INTRAVENOUS
  Filled 2015-04-07: qty 200

## 2015-04-07 NOTE — H&P (Signed)
[facility] History and Physical  Orest Dygert RFF:[id number] DOB: October 23, 1961 DOA: 04/07/2015  Referring physician: Constance Goltz, MD PCP: Vertell Novak    Chief Complaint: nose bleed and fatigue   HPI:  54 yo male, smoker and currently drink pack of beer per week, presented to [facility] ED (from PCP office) for evaluation of one day duration of progressively worsening nose bleeding. Pt explains he has noted progressive weakness over the past month with unintentional weight loss about 5-10 lbs, poor oral intake. Over the past weeks he started to experience progressively worsening dyspnea, initially present with exertion and has progressed to dyspnea at rest in the past 24 hours, associated with productive cough of yellow sputum, subjective fevers, chills, chest tightness worse with coughing spells. Pt denies any known sick contacts or exposures, no similar events in the past. Pt denies any specific abd or urinary concerns, no specific focal neurological symptoms.   In ED, pt noted to be in mild distress, VS notable for T 100.49F, HR 126, RR 30, Blood work notable for Hg 6.8 (2 U PRBC requested by ED doctor). TRH asked to admit to SDU for further evaluation.   Assessment and Plan: Principal Problem: Sepsis of unclear source, possible PNA based on clinical symptoms - criteria for sepsis met on admission with T 100.4 F, HR 126, RR 30, WBC 13.1 - will place sepsis order set, start broad spectrum ABX vancomycin and zosyn  - request blood, urine, sputum cultures, UA - request lactic acid and procalcitonin level - CT chest for clearer pulmonary evaluation  - repeat CBC in AM Acute blood loss anemia in the setting of nose bleeding  - unclear if additional source present  - FOBT and anemia panel pending  - agree with transfusion of 2 U PRBC for now - repeat CBC in AM - may need GI consult Acute respiratory failure imposed on COPD - secondary to possible PNA imposed on COPD - broad spectrum ABX  started until CT chest is back - continue BD's scheduled and as needed   Polysubstance abuse - alcohol and tobacco - will place on CIWA protocol - discuss cessation once pt more medically stable  HTN - has known diagnosis but not taking medications - will hold antihypertensive regimen for now as BP is stable  Transaminitis - alcohol induced  - repeat CMET in AM DVT prophylaxis  - SCD's  Radiological Exams on Admission: Dg Chest 2 View  04/07/2015   CLINICAL DATA:  Cough, shortness of breath.  EXAM: CHEST  2 VIEW  COMPARISON:  December 15, 2012.  FINDINGS: The heart size and mediastinal contours are within normal limits. Both lungs are clear. No pneumothorax or pleural effusion is noted. Hyperexpansion of the lungs is noted suggesting chronic obstructive pulmonary disease. The visualized skeletal structures are unremarkable.  IMPRESSION: Findings concerning for chronic obstructive pulmonary disease. No acute cardiopulmonary abnormality seen.   Electronically Signed   By: Marijo Conception, M.D.   On: 04/07/2015 12:50     Code Status: Full Family Communication: Pt at bedside Disposition Plan: Admit for further evaluation    [facility] [phone]   Review of Systems:  Constitutional: . Negative for diaphoresis.  HENT: Negative for hearing loss, ear pain, tinnitus and ear discharge.   Eyes: Negative for blurred vision, double vision, photophobia, pain, discharge and redness.  Respiratory: Negative for wheezing and stridor.   Cardiovascular: Negative for orthopnea, claudication and leg swelling.  Gastrointestinal:  Negative for heartburn, constipation, blood in  stool and melena.  Genitourinary: Negative for dysuria, urgency, frequency, hematuria and flank pain.  Musculoskeletal: Negative for myalgias, back pain, joint pain and falls.  Skin: Negative for itching and rash.  Neurological: Negative for dizziness Endo/Heme/Allergies: Negative for environmental allergies and polydipsia.  Does not bruise/bleed easily.  Psychiatric/Behavioral: Negative for suicidal ideas. The patient is not nervous/anxious.      Past Medical History  Diagnosis Date  . Glaucoma   . Hypertension     Past Surgical History  Procedure Laterality Date  . Plate in neck    . Right shoulder arthroscopy      Social History:  reports that he has been smoking.  He has never used smokeless tobacco. He reports that he drinks alcohol or use illicit drugs.  No Known Allergies  Family history of HTN, HLD but pt not sure if on mother of father side.     Prior to Admission medications   Medication Sig Start Date End Date Taking? Authorizing Provider  amitriptyline (ELAVIL) 50 MG tablet Take 1 tablet (50 mg total) by mouth at bedtime. 09/20/13  Yes Robbie Lis, MD  hydrochlorothiazide (MICROZIDE) 12.5 MG capsule Take 1 capsule (12.5 mg total) by mouth daily. Patient not taking: Reported on 04/07/2015 10/19/14   Olga Millers, MD  lisinopril (PRINIVIL,ZESTRIL) 20 MG tablet Take 1 tablet (20 mg total) by mouth daily. Patient not taking: Reported on 04/07/2015 09/06/14   Lance Bosch, NP  nicotine (NICODERM CQ - DOSED IN MG/24 HOURS) 14 mg/24hr patch Place 1 patch (14 mg total) onto the skin daily. Patient not taking: Reported on 04/07/2015 08/30/14   Lance Bosch, NP    Physical Exam: Filed Vitals:   04/07/15 1419 04/07/15 1500 04/07/15 1530  BP: 153/85 161/95 140/81  Pulse: 133 131 126  Temp: 98.2 F (36.8 C)    TempSrc: Oral    Resp: _0 SpO2: 99% 98% 98%    Physical Exam  Constitutional: Appears cachectic, in mild distress due to dyspnea  HENT: Normocephalic. External right and left ear normal. Oropharynx is dry  Eyes: Conjunctivae and EOM are normal. PERRLA, Neck: Normal ROM. Neck supple. No JVD. No tracheal deviation. No thyromegaly.  CVS: Regular rhythm, tachycardic, S1/S2 +, no murmurs, no gallops, no carotid bruit.  Pulmonary: Rhonchi at bases with mild tachypnea   Abdominal: Soft. BS +,  no distension, tenderness, rebound or guarding.  Musculoskeletal: Normal range of motion. No edema and no tenderness.  Lymphadenopathy: No lymphadenopathy noted, cervical, inguinal. Neuro: Alert. Normal reflexes, muscle tone coordination. No cranial nerve deficit. Skin: Skin is warm and dry. No rash noted. Not diaphoretic. No erythema. No pallor.  Psychiatric: Normal mood and affect. Behavior, judgment, thought content normal.   Labs on Admission:  Basic Metabolic Panel:  Recent Labs Lab 04/07/15 1101 04/07/15 1426  NA 135 137  K 4.4 4.1  CL 104 106  CO2 21 19  GLUCOSE 94 108*  BUN 5* 7  CREATININE 0.88 0.92  CALCIUM 9.6 9.0   Liver Function Tests:  Recent Labs Lab 04/07/15 1101 04/07/15 1426  AST 30 38*  ALT 16 18  ALKPHOS 71 67  BILITOT 0.6 0.7  PROT 8.7* 8.7*  ALBUMIN 4.1 4.0   CBC:  Recent Labs Lab 04/07/15 1101 04/07/15 1426  WBC 11.4* 13.1*  NEUTROABS  --  PENDING  HGB 6.8 Repeated and verified X2.* 6.4*  HCT 23.0 Repeated and verified X2.* 23.1*  MCV 60.6* 62.4*  PLT 286.0  290    EKG: pending    If 7PM-7AM, please contact night-coverage [email or web address] Password [facility] 04/07/2015, 3:45 PM

## 2015-04-07 NOTE — Progress Notes (Signed)
ANTIBIOTIC CONSULT NOTE - INITIAL  Pharmacy Consult for vancomycin and zosyn Indication: rule out sepsis  No Known Allergies  Patient Measurements: weight 57 kg, height 67 inches   Vital Signs: Temp: 100.4 F (38 C) (04/29 1707) Temp Source: Oral (04/29 1707) BP: 137/84 mmHg (04/29 1730) Pulse Rate: 126 (04/29 1730) Intake/Output from previous day:   Intake/Output from this shift: Total I/O In: 335 [Blood:335] Out: -   Labs:  Recent Labs  04/07/15 1101 04/07/15 1426  WBC 11.4* 13.1*  HGB 6.8 Repeated and verified X2.* 6.4*  PLT 286.0 290  CREATININE 0.88 0.92   Estimated Creatinine Clearance: 75 mL/min (by C-G formula based on Cr of 0.92). No results for input(s): VANCOTROUGH, VANCOPEAK, VANCORANDOM, GENTTROUGH, GENTPEAK, GENTRANDOM, TOBRATROUGH, TOBRAPEAK, TOBRARND, AMIKACINPEAK, AMIKACINTROU, AMIKACIN in the last 72 hours.   Microbiology: No results found for this or any previous visit (from the past 720 hour(s)).  Medical History: Past Medical History  Diagnosis Date  . Glaucoma   . Hypertension     Medications:   see med rec  Assessment: Patient's a 54 y.o M who presented to his PCP office earlier today with c/o nosebleeds, cough, and weight loss. Lab work performed at the office showed low hgb and he was subsequently advised to come to the ED for further workup/management. In the ED, patient was found to be febrile and have elevated wbc. To start broad abx for suspected sepsis.  Vancomycin 1gm and zosyn 3.375gm x1 infuse over 30 minutes ordered by MD.  Goal of Therapy:  Vancomycin trough level 15-20 mcg/ml  Plan:  - zozyn 3.375gm IV x1 over 30 minutes, then 3.375 gm IV q8h (infuse over 4 hours) - vancomycin 1gm IV x1 per MD, then 750 mg IV q12h  Adem Costlow P 04/07/2015,6:00 PM

## 2015-04-07 NOTE — ED Notes (Signed)
Pt c/o weakness and dizziness x 1 week. Pt saw PCP today for that reason. PCP drew blood and sent pt home. Pt received call from PCP stating his hgb was low and he needed to come to ER for a blood transfusion. Pt does not know what his hgb level was. Pt A&Ox4 and ambulatory. Denies pain. Pt c/o nosebleeds over the past week.

## 2015-04-07 NOTE — ED Notes (Signed)
Pt receiving blood transfusion.  Cannot draw blood during this time.

## 2015-04-07 NOTE — ED Notes (Addendum)
Dr. Winfred Leeds notified of oral temp 100.0. Prior to blood transfusion.  No further orders.

## 2015-04-07 NOTE — Progress Notes (Signed)
   Subjective:    Patient ID: Vincent [name], male    DOB: [birth year], 54 y.o.   MRN: [id number]  HPI The patient is a 54 YO man who is coming in for nosebleeds, cough and weight loss. He noticed the weight over the last month or so and is down between 5-10 pounds. It has been unintentional and he is still eating okay. He developed a cough about 2 weeks ago and has been productive. He is coughing up yellow to green stuff. Denies blood or brown sputum. He is a smoker and is still smoking. He has tried to quit unsuccessfully in the past. About 2 weeks ago as well he started having nosebleeds. They last 30 minutes or so and he has just held kleenex to his nose to stop them. He has not applied pressure. He cannot recall how many but feels that they have been frequent. They generally start after blowing his nose hard. Denies picking a lot but maybe sometimes. He previously has no nosebleeds or joint swelling and does not bleed or bruise easily. Has been having some fevers and chills in the evening.Denies any change to his bowel habits including dark stools, new rashes or bumps. Denies any other symptoms.    Review of Systems  Constitutional: Positive for fever, chills, activity change and fatigue. Negative for appetite change.  HENT: Positive for congestion and nosebleeds. Negative for dental problem, drooling, postnasal drip, rhinorrhea, sinus pressure and sore throat.   Respiratory: Positive for cough and shortness of breath. Negative for chest tightness and wheezing.   Cardiovascular: Negative for chest pain, palpitations and leg swelling.  Gastrointestinal: Negative.   Musculoskeletal: Negative.   Skin: Negative.   Neurological: Negative.       Objective:   Physical Exam  Constitutional: He is oriented to person, place, and time. He appears well-developed.  Skinny and mild distress  HENT:  Head: Normocephalic and atraumatic.  Clot visible first 1/3 of the right nostril, some pale mucosa in the  rear 2/3 of the left nostril but no clot of active bleeding noted.   Eyes: EOM are normal.  Neck: Normal range of motion.  Cardiovascular: Regular rhythm.   Fast rate  Pulmonary/Chest: Effort normal.  Some scattered wheezing in the lower lungs, no rhonchi or rales.   Abdominal: Soft.  Neurological: He is alert and oriented to person, place, and time. Coordination normal.  Skin: Skin is warm and dry. No rash noted.   Filed Vitals:   04/07/15 1033  BP: 118/56  Pulse: 125  Temp: 99.2 F (37.3 C)  TempSrc: Oral  Resp: 16  Height: 5\' 7"  (1.702 m)  Weight: 125 lb 12.8 oz (57.063 kg)  SpO2: 97%      Assessment & Plan:

## 2015-04-07 NOTE — ED Provider Notes (Signed)
CSN: [id number]     Arrival date & time 04/07/15  1414 History   None    Chief Complaint  Patient presents with  . Weakness  . Anemia  . Dizziness     (Consider location/radiation/quality/duration/timing/severity/associated sxs/prior Treatment) HPI Complains of generalized weakness onset one week ago. Symptoms coming by shortness of breath. Denies chest pain denies abdominal pain symptoms worse with standing or exertion improved with rest. Patient reports he had black bowel movements one week ago which stopped. He had normal bowel movement yesterday. He also reports he had a nosebleed yesterday from his right nare which lasted one hour. No treatment prior to coming here no other associated symptoms. Seen by Dr.Koehler today in office, sent here for further evaluation and treatment after lab data returned Past Medical History  Diagnosis Date  . Glaucoma   . Hypertension    Past Surgical History  Procedure Laterality Date  . Plate in neck    . Right shoulder arthroscopy     No family history on file. History  Substance Use Topics  . Smoking status: Current Every Day Smoker  . Smokeless tobacco: Never Used  . Alcohol Use: No   drinks one sixpack of beer per week. No illicit drug use  Review of Systems  HENT: Positive for nosebleeds.   Eyes: Positive for visual disturbance.       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   Cardiovascular: Negative.   Gastrointestinal: Negative.   Musculoskeletal: Negative.   Skin: Negative.   Neurological: Positive for weakness.  Psychiatric/Behavioral: Negative.   All other systems reviewed and are negative.     Allergies  Review of patient's allergies indicates no known allergies.  Home Medications   Prior to Admission medications   Medication Sig Start Date End Date Taking? Authorizing Provider  amitriptyline (ELAVIL) 50 MG tablet Take 1 tablet (50 mg total) by mouth at bedtime. 09/20/13   Yes Robbie Lis, MD  hydrochlorothiazide (MICROZIDE) 12.5 MG capsule Take 1 capsule (12.5 mg total) by mouth daily. Patient not taking: Reported on 04/07/2015 10/19/14   Olga Millers, MD  lisinopril (PRINIVIL,ZESTRIL) 20 MG tablet Take 1 tablet (20 mg total) by mouth daily. Patient not taking: Reported on 04/07/2015 09/06/14   Lance Bosch, NP  nicotine (NICODERM CQ - DOSED IN MG/24 HOURS) 14 mg/24hr patch Place 1 patch (14 mg total) onto the skin daily. Patient not taking: Reported on 04/07/2015 08/30/14   Lance Bosch, NP   BP 153/85 mmHg  Pulse 133  Temp(Src) 98.2 F (36.8 C) (Oral)  Resp 18  SpO2 99% Physical Exam  Constitutional: He appears well-developed and well-nourished.  HENT:  Head: Normocephalic and atraumatic.  Eyes: Conjunctivae are normal.  Right lateral strabismus  Neck: Neck supple. No tracheal deviation present. No thyromegaly present.  Cardiovascular: Regular rhythm.   No murmur heard. Tachycardic  Pulmonary/Chest: Effort normal.  Diffuse rhonchi  Abdominal: Soft. Bowel sounds are normal. He exhibits no distension. There is no tenderness.  Musculoskeletal: Normal range of motion. He exhibits no edema or tenderness.  Neurological: He is alert. Coordination normal.  Skin: Skin is warm and dry. No rash noted.  Psychiatric: He has a normal mood and affect.  Nursing note and vitals reviewed.   ED Course  Procedures (including critical care time) Labs Review Labs Reviewed  CBC WITH DIFFERENTIAL/PLATELET  COMPREHENSIVE METABOLIC PANEL  TYPE AND SCREEN    Imaging Review Dg Chest 2  View  04/07/2015   CLINICAL DATA:  Cough, shortness of breath.  EXAM: CHEST  2 VIEW  COMPARISON:  December 15, 2012.  FINDINGS: The heart size and mediastinal contours are within normal limits. Both lungs are clear. No pneumothorax or pleural effusion is noted. Hyperexpansion of the lungs is noted suggesting chronic obstructive pulmonary disease. The visualized skeletal  structures are unremarkable.  IMPRESSION: Findings concerning for chronic obstructive pulmonary disease. No acute cardiopulmonary abnormality seen.   Electronically Signed   By: Marijo Conception, M.D.   On: 04/07/2015 12:50     EKG Interpretation None     ED ECG REPORT   Date: 04/07/2015  Rate: 125  Rhythm: sinus tachycardia  QRS Axis: normal  Intervals: normal  ST/T Wave abnormalities: normal  Conduction Disutrbances:none  Narrative Interpretation:   Old EKG Reviewed: none available  I have personally reviewed the EKG tracing and agree with the computerized printout as noted.  4:30 PM breathing improved after treatment with albuterol nebulizer remains tachycardic Results for orders placed or performed during the hospital encounter of 04/07/15  CBC with Differential  Result Value Ref Range   WBC 13.1 (H) 4.0 - 10.5 K/uL   RBC 3.70 (L) 4.22 - 5.81 MIL/uL   Hemoglobin 6.4 (LL) 13.0 - 17.0 g/dL   HCT 23.1 (L) 39.0 - 52.0 %   MCV 62.4 (L) 78.0 - 100.0 fL   MCH 17.3 (L) 26.0 - 34.0 pg   MCHC 27.7 (L) 30.0 - 36.0 g/dL   RDW 21.7 (H) 11.5 - 15.5 %   Platelets 290 150 - 400 K/uL   Neutrophils Relative % 82 (H) 43 - 77 %   Lymphocytes Relative 9 (L) 12 - 46 %   Monocytes Relative 8 3 - 12 %   Eosinophils Relative 0 0 - 5 %   Basophils Relative 1 0 - 1 %   Neutro Abs 10.8 (H) 1.7 - 7.7 K/uL   Lymphs Abs 1.2 0.7 - 4.0 K/uL   Monocytes Absolute 1.0 0.1 - 1.0 K/uL   Eosinophils Absolute 0.0 0.0 - 0.7 K/uL   Basophils Absolute 0.1 0.0 - 0.1 K/uL   RBC Morphology POLYCHROMASIA PRESENT    Smear Review LARGE PLATELETS PRESENT   Comprehensive metabolic panel  Result Value Ref Range   Sodium 137 135 - 145 mmol/L   Potassium 4.1 3.5 - 5.1 mmol/L   Chloride 106 96 - 112 mmol/L   CO2 19 19 - 32 mmol/L   Glucose, Bld 108 (H) 70 - 99 mg/dL   BUN 7 6 - 23 mg/dL   Creatinine, Ser 0.92 0.50 - 1.35 mg/dL   Calcium 9.0 8.4 - 10.5 mg/dL   Total Protein 8.7 (H) 6.0 - 8.3 g/dL   Albumin 4.0  3.5 - 5.2 g/dL   AST 38 (H) 0 - 37 U/L   ALT 18 0 - 53 U/L   Alkaline Phosphatase 67 39 - 117 U/L   Total Bilirubin 0.7 0.3 - 1.2 mg/dL   GFR calc non Af Amer >90 >90 mL/min   GFR calc Af Amer >90 >90 mL/min   Anion gap 12 5 - 15  Protime-INR  Result Value Ref Range   Prothrombin Time 14.5 11.6 - 15.2 seconds   INR 1.12 0.00 - 1.49  Type and screen for Red Blood Exchange  Result Value Ref Range   ABO/RH(D) A POS    Antibody Screen NEG    Sample Expiration 04/10/2015    Unit Number [id number]  Blood Component Type RED CELLS,LR    Unit division 00    Status of Unit ALLOCATED    Transfusion Status OK TO TRANSFUSE    Crossmatch Result Compatible    Unit Number [id number]    Blood Component Type RED CELLS,LR    Unit division 00    Status of Unit ALLOCATED    Transfusion Status OK TO TRANSFUSE    Crossmatch Result Compatible   Prepare RBC  Result Value Ref Range   Order Confirmation ORDER PROCESSED BY BLOOD BANK   ABO/Rh  Result Value Ref Range   ABO/RH(D) A POS    Dg Chest 2 View  04/07/2015   CLINICAL DATA:  Cough, shortness of breath.  EXAM: CHEST  2 VIEW  COMPARISON:  December 15, 2012.  FINDINGS: The heart size and mediastinal contours are within normal limits. Both lungs are clear. No pneumothorax or pleural effusion is noted. Hyperexpansion of the lungs is noted suggesting chronic obstructive pulmonary disease. The visualized skeletal structures are unremarkable.  IMPRESSION: Findings concerning for chronic obstructive pulmonary disease. No acute cardiopulmonary abnormality seen.   Electronically Signed   By: Marijo Conception, M.D.   On: 04/07/2015 12:50    MDM  Spoke with Dr.Meyers plan Admit to stepdown unit, transfuse emergently withpacked red blood cells Diagnosis #1 symptomatic anemia #2 dypsnea #3 weakness #2 dyspnea Final diagnoses:  None    CRITICAL CARE Performed by: Orlie Dakin Total critical care time: 30 minute Critical care time was  exclusive of separately billable procedures and treating other patients. Critical care was necessary to treat or prevent imminent or life-threatening deterioration. Critical care was time spent personally by me on the following activities: development of treatment plan with patient and/or surrogate as well as nursing, discussions with consultants, evaluation of patient's response to treatment, examination of patient, obtaining history from patient or surrogate, ordering and performing treatments and interventions, ordering and review of laboratory studies, ordering and review of radiographic studies, pulse oximetry and re-evaluation of patient's condition.    Orlie Dakin, MD 04/07/15 [phone]

## 2015-04-07 NOTE — Assessment & Plan Note (Signed)
Especially concerning given his current smoking status and cough. He is having some fevers and chills. Check labs including CBC, CMP, free T4 and TSH as well as CXR. If no findings to explain cough on CXR will get CT scan lungs. Is down 8 pounds since November and he indicates that most of this is in the last month.

## 2015-04-07 NOTE — ED Notes (Signed)
Per Lebaur, Hgb was 6.8 today at 1100

## 2015-04-07 NOTE — Assessment & Plan Note (Signed)
He is not able to well define how often and how much bleeding he is having. Check CBC for anemia given his status and fast heart rate. If he has anemia <8 send to ER for evaluation and possible transfusion. Called with critical lab value of Hg 6.8 and called patient to send him to the ER. Unable to review rest of his labs at this time as they are not resulted.

## 2015-04-07 NOTE — Patient Instructions (Signed)
We are going to check some blood work today to make sure that there are no thyroid problems.   We are going to check a chest x-ray to see if you have a pneumonia in the lungs. If you do have changes we may need to give you antibiotics to get rid of it.   For the nosebleeds no picking or anything in the nose. Try to blow gently when you are clearing your nose. If you get another nosebleed hold pressure to it for 10-15 minutes at the bridge of the nose. You can put a humidifier in your room at night time to help with moisture in the air. You can also use vaseline on the rim of your nostrils twice a day so that they moisturize and help keep it from happening again. If you have another nosebleed you can use afrin as a temporary fix to shrink the blood vessels in the nose.   We will call you back with the results.    Nosebleed Nosebleeds can be caused by many conditions, including trauma, infections, polyps, foreign bodies, dry mucous membranes or climate, medicines, and air conditioning. Most nosebleeds occur in the front of the nose. Because of this location, most nosebleeds can be controlled by pinching the nostrils gently and continuously for at least 10 to 20 minutes. The long, continuous pressure allows enough time for the blood to clot. If pressure is released during that 10 to 20 minute time period, the process may have to be started again. The nosebleed may stop by itself or quit with pressure, or it may need concentrated heating (cautery) or pressure from packing. HOME CARE INSTRUCTIONS   If your nose was packed, try to maintain the pack inside until your health care provider removes it. If a gauze pack was used and it starts to fall out, gently replace it or cut the end off. Do not cut if a balloon catheter was used to pack the nose. Otherwise, do not remove unless instructed.  Avoid blowing your nose for 12 hours after treatment. This could dislodge the pack or clot and start the bleeding  again.  If the bleeding starts again, sit up and bend forward, gently pinching the front half of your nose continuously for 20 minutes.  If bleeding was caused by dry mucous membranes, use over-the-counter saline nasal spray or gel. This will keep the mucous membranes moist and allow them to heal. If you must use a lubricant, choose the water-soluble variety. Use it only sparingly and not within several hours of lying down.  Do not use petroleum jelly or mineral oil, as these may drip into the lungs and cause serious problems.  Maintain humidity in your home by using less air conditioning or by using a humidifier.  Do not use aspirin or medicines which make bleeding more likely. Your health care provider can give you recommendations on this.  Resume normal activities as you are able, but try to avoid straining, lifting, or bending at the waist for several days.  If the nosebleeds become recurrent and the cause is unknown, your health care provider may suggest laboratory tests. SEEK MEDICAL CARE IF: You have a fever. SEEK IMMEDIATE MEDICAL CARE IF:   Bleeding recurs and cannot be controlled.  There is unusual bleeding from or bruising on other parts of the body.  Nosebleeds continue.  There is any worsening of the condition which originally brought you in.  You become light-headed, feel faint, become sweaty, or vomit blood. MAKE  SURE YOU:   Understand these instructions.  Will watch your condition.  Will get help right away if you are not doing well or get worse. Document Released: 09/04/2005 Document Revised: 04/11/2014 Document Reviewed: 10/26/2009 [facility] Patient Information 2015 [place], [place]. This information is not intended to replace advice given to you by your health care provider. Make sure you discuss any questions you have with your health care provider.

## 2015-04-07 NOTE — Progress Notes (Signed)
Pre visit review using our clinic review tool, if applicable. No additional management support is needed unless otherwise documented below in the visit note. 

## 2015-04-08 LAB — IRON AND TIBC
IRON: 48 ug/dL (ref 42–165)
SATURATION RATIOS: 11 % — AB (ref 20–55)
TIBC: 442 ug/dL — ABNORMAL HIGH (ref 215–435)
UIBC: 394 ug/dL (ref 125–400)

## 2015-04-08 LAB — URINE CULTURE
COLONY COUNT: NO GROWTH
Culture: NO GROWTH

## 2015-04-08 LAB — URINALYSIS, ROUTINE W REFLEX MICROSCOPIC
Bilirubin Urine: NEGATIVE
GLUCOSE, UA: NEGATIVE mg/dL
HGB URINE DIPSTICK: NEGATIVE
Ketones, ur: NEGATIVE mg/dL
Leukocytes, UA: NEGATIVE
Nitrite: NEGATIVE
Protein, ur: NEGATIVE mg/dL
Specific Gravity, Urine: 1.022 (ref 1.005–1.030)
Urobilinogen, UA: 1 mg/dL (ref 0.0–1.0)
pH: 5.5 (ref 5.0–8.0)

## 2015-04-08 LAB — CBC
HEMATOCRIT: 28.6 % — AB (ref 39.0–52.0)
Hemoglobin: 8.2 g/dL — ABNORMAL LOW (ref 13.0–17.0)
MCH: 19.6 pg — AB (ref 26.0–34.0)
MCHC: 28.7 g/dL — AB (ref 30.0–36.0)
MCV: 68.4 fL — ABNORMAL LOW (ref 78.0–100.0)
Platelets: 252 10*3/uL (ref 150–400)
RBC: 4.18 MIL/uL — ABNORMAL LOW (ref 4.22–5.81)
RDW: 24.9 % — AB (ref 11.5–15.5)
WBC: 10.7 10*3/uL — ABNORMAL HIGH (ref 4.0–10.5)

## 2015-04-08 LAB — BASIC METABOLIC PANEL
Anion gap: 8 (ref 5–15)
BUN: 10 mg/dL (ref 6–23)
CO2: 21 mmol/L (ref 19–32)
Calcium: 8.7 mg/dL (ref 8.4–10.5)
Chloride: 104 mmol/L (ref 96–112)
Creatinine, Ser: 1.03 mg/dL (ref 0.50–1.35)
GFR calc Af Amer: >90 mL/min (ref >90)
GFR, EST NON AFRICAN AMERICAN: 81 mL/min — AB (ref >90)
GLUCOSE: 109 mg/dL — AB (ref 70–99)
POTASSIUM: 3.4 mmol/L — AB (ref 3.5–5.1)
SODIUM: 133 mmol/L — AB (ref 135–145)

## 2015-04-08 LAB — HEPATIC FUNCTION PANEL
ALBUMIN: 3.6 g/dL (ref 3.5–5.2)
ALT: 16 U/L (ref 0–53)
AST: 26 U/L (ref 0–37)
Alkaline Phosphatase: 65 U/L (ref 39–117)
Bilirubin, Direct: 0.3 mg/dL (ref 0.0–0.5)
Indirect Bilirubin: 1.9 mg/dL — ABNORMAL HIGH (ref 0.3–0.9)
TOTAL PROTEIN: 7.8 g/dL (ref 6.0–8.3)
Total Bilirubin: 2.2 mg/dL — ABNORMAL HIGH (ref 0.3–1.2)

## 2015-04-08 LAB — FOLATE: FOLATE: 6 ng/mL

## 2015-04-08 LAB — LACTIC ACID, PLASMA: Lactic Acid, Venous: 1.3 mmol/L (ref 0.5–2.0)

## 2015-04-08 LAB — STREP PNEUMONIAE URINARY ANTIGEN: Strep Pneumo Urinary Antigen: NEGATIVE

## 2015-04-08 LAB — FERRITIN: FERRITIN: 7 ng/mL — AB (ref 22–322)

## 2015-04-08 LAB — VITAMIN B12: Vitamin B-12: 508 pg/mL (ref 211–911)

## 2015-04-08 MED ORDER — LEVALBUTEROL HCL 1.25 MG/0.5ML IN NEBU
1.2500 mg | INHALATION_SOLUTION | Freq: Four times a day (QID) | RESPIRATORY_TRACT | Status: DC
  Administered 2015-04-08 – 2015-04-13 (×21): 1.25 mg via RESPIRATORY_TRACT
  Filled 2015-04-08 (×25): qty 0.5

## 2015-04-08 MED ORDER — POTASSIUM CHLORIDE CRYS ER 20 MEQ PO TBCR
40.0000 meq | EXTENDED_RELEASE_TABLET | Freq: Once | ORAL | Status: AC
  Administered 2015-04-08: 40 meq via ORAL
  Filled 2015-04-08: qty 2

## 2015-04-08 MED ORDER — PNEUMOCOCCAL VAC POLYVALENT 25 MCG/0.5ML IJ INJ
0.5000 mL | INJECTION | INTRAMUSCULAR | Status: AC
  Administered 2015-04-09: 0.5 mL via INTRAMUSCULAR
  Filled 2015-04-08 (×2): qty 0.5

## 2015-04-08 MED ORDER — LISINOPRIL 20 MG PO TABS
20.0000 mg | ORAL_TABLET | Freq: Every day | ORAL | Status: DC
  Administered 2015-04-08 – 2015-04-12 (×5): 20 mg via ORAL
  Filled 2015-04-08: qty 2
  Filled 2015-04-08 (×5): qty 1

## 2015-04-08 NOTE — Progress Notes (Signed)
Patient ID: Vincent [name], male   DOB: Sep 30, 1961, 54 y.o.   MRN: [id number]  [facility] PROGRESS NOTE  Vincent [name] NWG:[id number] DOB: December 30, 1960 DOA: 04/07/2015 PCP: Chari Manning, NP   Brief narrative:    54 yo male, smoker and currently drink pack of beer per week, presented to [facility] ED (from PCP office) for evaluation of one day duration of progressively worsening nose bleeding. Pt explains he has noted progressive weakness over the past month with unintentional weight loss about 5-10 lbs, poor oral intake. Over the past weeks he started to experience progressively worsening dyspnea, initially present with exertion and has progressed to dyspnea at rest in the past 24 hours, associated with productive cough of yellow sputum, subjective fevers, chills, chest tightness worse with coughing spells. Pt denies any known sick contacts or exposures, no similar events in the past. Pt denies any specific abd or urinary concerns, no specific focal neurological symptoms.   In ED, pt noted to be in mild distress, VS notable for T 100.79F, HR 126, RR 30, Blood work notable for Hg 6.8 (2 U PRBC requested by ED doctor). [facility] asked to admit to SDU for further evaluation.   Assessment/Plan:    Principal Problem:  Sepsis secondary to multilobar PNA - criteria for sepsis met on admission with T 100.4 F, HR 126, RR 30, WBC 13.1, lactic acid 2.1 - sepsis order set in place, pt started on vancomycin and zosyn for presumptive PNA - CXR with no signs of PNA but CT chest confirmed our suspicion, multifocal PNA - pt is clinically improving this AM and maintaining oxygen saturation at target range, WBC is trending down and Tmax 99.1 F over the past 24 hours  - follow up on blood, urine, sputum cultures - repeat CBC in AM  Acute blood loss anemia in the setting of nose bleeding, microcytic anemia  - unclear if additional source present  - FOBT and anemia panel pending  - status post transfusion of 2 U PRBC on  admission with appropriate increase in post transfusion Hg from 6.4 --> 8.2 - repeat CBC in AM  - no further nose bleeds  - continue Protonix BID IV Acute respiratory failure imposed on COPD  - secondary to PNA imposed on COPD  - broad spectrum ABX started as noted above - please note that CT chest did confirm multifocal PNA - continue BD's scheduled and as needed  Polysubstance abuse  - alcohol and tobacco, THC - keep on CIWA protocol  - discussed cessation and pt has verbalized understanding  HTN - has known diagnosis but not taking medications  - will add lisinopril for now  Transaminitis  - alcohol induced, LFT's trending down  - repeat CMET in AM  Hypokalemia - supplement and repeat BMP in AM Non severe PCM - with recent weight loss of unclear etiology - nutritionist consulted  DVT prophylaxis  - SCD's  Code Status: Full.  Family Communication:  plan of care discussed with the patient adn wife at bedside  Disposition Plan: Transfer to telemetry bed   IV access:  Peripheral IV  Procedures and diagnostic studies:    Dg Chest 2 View  04/07/2015  Findings concerning for chronic obstructive pulmonary disease. No acute cardiopulmonary abnormality seen.     Ct Chest Wo Contrast  04/07/2015   Patchy interstitial infiltrates most likely related to multi focal pneumonia. No significant adenopathy.     Medical Consultants:  None   Other Consultants:  None   IAnti-Infectives:  Vancomycin 4/29 --> Zosyn 4/29 -->  Faye Ramsay, MD  [facility] Pager [phone]  If 7PM-7AM, please contact night-coverage [email or web address] Password [facility] 04/08/2015, 10:17 AM   LOS: 1 day   HPI/Subjective: No events overnight. Still with exertional dyspnea and productive cough, no nose bleeding.   Objective: Filed Vitals:   04/08/15 0118 04/08/15 0400 04/08/15 0738 04/08/15 0800  BP: 141/83 149/90    Pulse: 106 99    Temp: 98.8 F (37.1 C)   99.1 F (37.3 C)  TempSrc: Oral   Oral   Resp: 31 20    SpO2: 95% 98% 97%     Intake/Output Summary (Last 24 hours) at 04/08/15 1017 Last data filed at 04/08/15 0600  Gross per 24 hour  Intake   1000 ml  Output      0 ml  Net   1000 ml    Exam:   General:  Pt is alert, follows commands appropriately, not in acute distress  Cardiovascular: Regular rate and rhythm, S1/S2, no murmurs, no rubs, no gallops  Respiratory: Rhonchi at bases with diminished breath sounds bilaterally, productive cough noted   Abdomen: Soft, non tender, non distended, bowel sounds present, no guarding  Extremities: No edema, pulses DP and PT palpable bilaterally  Neuro: Grossly nonfocal  Data Reviewed: Basic Metabolic Panel:  Recent Labs Lab 04/07/15 1101 04/07/15 1426 04/07/15 1942 04/08/15 0342  NA 135 137  --  133*  K 4.4 4.1  --  3.4*  CL 104 106  --  104  CO2 21 19  --  21  GLUCOSE 94 108*  --  109*  BUN 5* 7  --  10  CREATININE 0.88 0.92  --  1.03  CALCIUM 9.6 9.0  --  8.7  MG  --   --  1.9  --   PHOS  --   --  3.1  --    Liver Function Tests:  Recent Labs Lab 04/07/15 1101 04/07/15 1426 04/08/15 0342  AST 30 38* 26  ALT '16 18 16  ' ALKPHOS 71 67 65  BILITOT 0.6 0.7 2.2*  PROT 8.7* 8.7* 7.8  ALBUMIN 4.1 4.0 3.6   CBC:  Recent Labs Lab 04/07/15 1101 04/07/15 1426 04/08/15 0342  WBC 11.4* 13.1* 10.7*  NEUTROABS  --  10.8*  --   HGB 6.8 Repeated and verified X2.* 6.4* 8.2*  HCT 23.0 Repeated and verified X2.* 23.1* 28.6*  MCV 60.6* 62.4* 68.4*  PLT 286.0 290 252     Recent Results (from the past 240 hour(s))  MRSA PCR Screening     Status: None   Collection Time: 04/07/15  6:50 PM  Result Value Ref Range Status   MRSA by PCR NEGATIVE NEGATIVE Final  Culture, expectorated sputum-assessment     Status: None   Collection Time: 04/07/15  7:15 PM  Result Value Ref Range Status   Specimen Description SPUTUM  Final   Special Requests NONE  Final   Sputum evaluation   Final    THIS SPECIMEN IS  ACCEPTABLE. RESPIRATORY CULTURE REPORT TO FOLLOW.   Report Status 04/07/2015 FINAL  Final     Scheduled Meds: . levalbuterol  1.25 mg Nebulization QID  . pantoprazole (PROTONIX) IV  40 mg Intravenous Q12H  . piperacillin-tazobactam  3.375 g Intravenous Once  . piperacillin-tazobactam (ZOSYN)  IV  3.375 g Intravenous Q8H  . vancomycin  750 mg Intravenous Q12H   Continuous Infusions: . sodium chloride

## 2015-04-09 ENCOUNTER — Inpatient Hospital Stay (HOSPITAL_COMMUNITY): Payer: Medicare HMO

## 2015-04-09 DIAGNOSIS — A409 Streptococcal sepsis, unspecified: Secondary | ICD-10-CM | POA: Diagnosis not present

## 2015-04-09 DIAGNOSIS — E43 Unspecified severe protein-calorie malnutrition: Secondary | ICD-10-CM | POA: Diagnosis present

## 2015-04-09 LAB — CBC
HCT: 26.8 % — ABNORMAL LOW (ref 39.0–52.0)
Hemoglobin: 8 g/dL — ABNORMAL LOW (ref 13.0–17.0)
MCH: 20.4 pg — ABNORMAL LOW (ref 26.0–34.0)
MCHC: 29.9 g/dL — AB (ref 30.0–36.0)
MCV: 68.4 fL — ABNORMAL LOW (ref 78.0–100.0)
Platelets: 203 10*3/uL (ref 150–400)
RBC: 3.92 MIL/uL — ABNORMAL LOW (ref 4.22–5.81)
RDW: 25 % — ABNORMAL HIGH (ref 11.5–15.5)
WBC: 8.7 10*3/uL (ref 4.0–10.5)

## 2015-04-09 LAB — COMPREHENSIVE METABOLIC PANEL
ALBUMIN: 3.1 g/dL — AB (ref 3.5–5.0)
ALK PHOS: 52 U/L (ref 38–126)
ALT: 14 U/L — AB (ref 17–63)
AST: 23 U/L (ref 15–41)
Anion gap: 5 (ref 5–15)
BILIRUBIN TOTAL: 1.4 mg/dL — AB (ref 0.3–1.2)
BUN: 10 mg/dL (ref 6–20)
CHLORIDE: 107 mmol/L (ref 101–111)
CO2: 21 mmol/L — ABNORMAL LOW (ref 22–32)
Calcium: 8.5 mg/dL — ABNORMAL LOW (ref 8.9–10.3)
Creatinine, Ser: 1.09 mg/dL (ref 0.61–1.24)
GFR calc Af Amer: >60 mL/min (ref >60)
GFR calc non Af Amer: >60 mL/min (ref >60)
Glucose, Bld: 95 mg/dL (ref 70–99)
POTASSIUM: 4.1 mmol/L (ref 3.5–5.1)
SODIUM: 133 mmol/L — AB (ref 135–145)
Total Protein: 7.1 g/dL (ref 6.5–8.1)

## 2015-04-09 LAB — URINE CULTURE
Colony Count: NO GROWTH
Culture: NO GROWTH

## 2015-04-09 MED ORDER — NICOTINE 21 MG/24HR TD PT24
21.0000 mg | MEDICATED_PATCH | Freq: Every day | TRANSDERMAL | Status: DC
  Administered 2015-04-09 – 2015-04-13 (×5): 21 mg via TRANSDERMAL
  Filled 2015-04-09 (×5): qty 1

## 2015-04-09 MED ORDER — ENSURE ENLIVE PO LIQD
237.0000 mL | Freq: Two times a day (BID) | ORAL | Status: DC
  Administered 2015-04-09 – 2015-04-13 (×8): 237 mL via ORAL

## 2015-04-09 MED ORDER — GUAIFENESIN 100 MG/5ML PO SOLN
200.0000 mg | ORAL | Status: DC | PRN
  Administered 2015-04-10: 200 mg via ORAL
  Filled 2015-04-09: qty 10

## 2015-04-09 MED ORDER — GUAIFENESIN ER 600 MG PO TB12
600.0000 mg | ORAL_TABLET | Freq: Two times a day (BID) | ORAL | Status: DC
  Administered 2015-04-09 – 2015-04-10 (×3): 600 mg via ORAL
  Filled 2015-04-09 (×3): qty 1

## 2015-04-09 MED ORDER — METHYLPREDNISOLONE SODIUM SUCC 125 MG IJ SOLR
80.0000 mg | Freq: Four times a day (QID) | INTRAMUSCULAR | Status: DC
  Administered 2015-04-09 – 2015-04-10 (×6): 80 mg via INTRAVENOUS
  Filled 2015-04-09 (×6): qty 2

## 2015-04-09 NOTE — Progress Notes (Signed)
Initial Nutrition Assessment  DOCUMENTATION CODES:  Severe malnutrition in context of acute illness/injury  INTERVENTION:  -Provide Ensure Enlive po BID, each supplement provides 350 kcal and 20 grams of protein -Provide Magic cup TID with meals, each supplement provides 290 kcal and 9 grams of protein -Encourage PO intake -RD to continue to monitor  NUTRITION DIAGNOSIS:  Malnutrition related to other (see comment) (poor appetite) as evidenced by moderate depletion of body fat, severe depletion of muscle mass.  GOAL:  Patient will meet greater than or equal to 90% of their needs  MONITOR:  PO intake, Supplement acceptance, Labs, Weight trends, I & O's  REASON FOR ASSESSMENT:  Consult, Malnutrition Screening Tool Assessment of nutrition requirement/status  ASSESSMENT: 54 yo male, smoker and currently drink pack of beer per week, presented to [facility] ED (from PCP office) for evaluation of one day duration of progressively worsening nose bleeding. Pt explains he has noted progressive weakness over the past month with unintentional weight loss about 5-10 lbs, poor oral intake.   Pt in room with family at bedside. Pt reports 40 lb weight loss that began 1.5-2 months ago. Per weight history documentation, pt has lost weight over a longer period of time. Pt reports poor PO intake PTA, <50% of usual intake. PO intake: 75% of regular diet.  Pt would like to try Ensure supplements and magic cups. RD to order.  Labs reviewed: Low Na  Height:  Ht Readings from Last 1 Encounters:  04/08/15 5\' 7"  (1.702 m)    Weight:  Wt Readings from Last 1 Encounters:  04/09/15 136 lb 3.9 oz (61.8 kg)    Ideal Body Weight:  67.3 kg  Wt Readings from Last 10 Encounters:  04/09/15 136 lb 3.9 oz (61.8 kg)  04/07/15 125 lb 12.8 oz (57.063 kg)  10/19/14 133 lb (60.328 kg)  09/21/14 133 lb 6.4 oz (60.51 kg)  08/16/14 132 lb (59.875 kg)  09/06/13 139 lb (63.05 kg)  07/05/13 143 lb 12.8 oz  (65.227 kg)    BMI:  Body mass index is 21.33 kg/(m^2).  Estimated Nutritional Needs:  Kcal:  1600-1800  Protein:  85-95g  Fluid:  1.6L/day   Skin:  Reviewed, no issues  Diet Order:  Diet regular Room service appropriate?: Yes; Fluid consistency:: Thin  EDUCATION NEEDS:  No education needs identified at this time   Intake/Output Summary (Last 24 hours) at 04/09/15 1243 Last data filed at 04/09/15 0709  Gross per 24 hour  Intake 1144.17 ml  Output    800 ml  Net 344.17 ml    Last BM:  4/28  Clayton Bibles, MS, RD, LDN Pager: [phone] After Hours Pager: [phone]

## 2015-04-09 NOTE — Progress Notes (Addendum)
Patient ID: Vincent [name], male   DOB: [birth year], 54 y.o.   MRN: [id number]  [facility] PROGRESS NOTE  Vincent [name] FMB:[id number] DOB: May 21, 1961 DOA: 04/07/2015 PCP: Chari Manning, NP   Brief narrative:    54 yo male, smoker and currently drink pack of beer per week, presented to [facility] ED (from PCP office) for evaluation of one day duration of progressively worsening nose bleeding. Pt explains he has noted progressive weakness over the past month with unintentional weight loss about 5-10 lbs, poor oral intake. Over the past weeks he started to experience progressively worsening dyspnea, initially present with exertion and has progressed to dyspnea at rest in the past 24 hours, associated with productive cough of yellow sputum, subjective fevers, chills, chest tightness worse with coughing spells. Pt denies any known sick contacts or exposures, no similar events in the past. Pt denies any specific abd or urinary concerns, no specific focal neurological symptoms.   In ED, pt noted to be in mild distress, VS notable for T 100.46F, HR 126, RR 30, Blood work notable for Hg 6.8 (2 U PRBC requested by ED doctor). [facility] asked to admit to SDU for further evaluation.   Major events since admission: 5/1 - wheezing with more course breaths sounds, requiring initiating solumedrol and repeat CXR  Assessment/Plan:    Principal Problem:  Sepsis secondary to multilobar PNA - criteria for sepsis met on admission with T 100.4 F, HR 126, RR 30, WBC 13.1, lactic acid 2.1 - sepsis order set in place, pt started on vancomycin and zosyn for presumptive PNA - CXR with no signs of PNA but CT chest confirmed our suspicion, multifocal PNA - pt remains afebrile, sepsis etiology resolving but with new onset wheezing and more course breath sounds this AM - will ask for repeat CXR and start solumedrol - continue current abx - follow up on blood, urine, sputum cultures, HIV - repeat CBC in AM  Acute blood loss anemia in  the setting of nose bleeding, microcytic anemia,? IDA - unclear if additional source present  - anemia panel with Iron 48 (which is on low end of normal) - status post transfusion of 2 U PRBC on admission with appropriate increase in post transfusion Hg from 6.4 --> 8.2 --> 8.0 - repeat CBC in AM  - no further nose bleeds  - continue Protonix BID IV Acute respiratory failure imposed on acute COPD  - secondary to PNA imposed on acute COPD  - Patient with wheezing this morning, coarse breath sounds - Plan on starting Solu-Medrol IV and taper down as clinically indicated, chest x-ray requested as well - broad spectrum ABX started as noted above - please note that CT chest did confirm multifocal PNA - continue BD's scheduled and as needed  Polysubstance abuse  - alcohol and tobacco, THC - keep on CIWA protocol  - discussed cessation and pt has verbalized understanding  HTN - has known diagnosis but not taking medications  - Added lisinopril for better blood pressure control, blood pressure reasonably stable this morning Transaminitis  - alcohol induced, LFT's trending down  Hypokalemia - supplemented and within normal limits this morning Severe PCM - with recent weight loss of unclear etiology - nutritionist consulted  DVT prophylaxis  - SCD's  Code Status: Full.  Family Communication:  plan of care discussed with the patient adn wife at bedside  Disposition Plan:  not ready for discharge at this time due to worsening wheezing requiring IVs steroids   IV access:  Peripheral IV  Procedures and diagnostic studies:    Dg Chest 2 View  04/07/2015  Findings concerning for chronic obstructive pulmonary disease. No acute cardiopulmonary abnormality seen.     Ct Chest Wo Contrast  04/07/2015   Patchy interstitial infiltrates most likely related to multi focal pneumonia. No significant adenopathy.     Medical Consultants:  None   Other Consultants:  None   IAnti-Infectives:    Vancomycin 4/29 --> Zosyn 4/29 -->  Faye Ramsay, MD  [facility] Pager [phone]  If 7PM-7AM, please contact night-coverage [email or web address] Password [email or web address] 04/09/2015, 10:03 AM   LOS: 2 days   HPI/Subjective: No events overnight. Still with exertional dyspnea and productive cough, more wheezing this AM Objective: Filed Vitals:   04/08/15 2051 04/09/15 0500 04/09/15 0600 04/09/15 0755  BP: 138/91 144/85    Pulse: 96 99    Temp: 99.1 F (37.3 C) 98.2 F (36.8 C)    TempSrc: Oral Oral    Resp: 23 22    Height:      Weight:  61.8 kg (136 lb 3.9 oz)    SpO2: 99% 99% 98% 95%    Intake/Output Summary (Last 24 hours) at 04/09/15 1003 Last data filed at 04/09/15 0709  Gross per 24 hour  Intake 1244.17 ml  Output    800 ml  Net 444.17 ml    Exam:   General:  Pt is alert, follows commands appropriately, not in acute distress  Cardiovascular: Regular rate and rhythm, S1/S2, no murmurs, no rubs, no gallops  Respiratory: Course breath sounds this AM, exp and ins wheezing with diminished breath sounds at bases  Abdomen: Soft, non tender, non distended, bowel sounds present, no guarding  Extremities: No edema, pulses DP and PT palpable bilaterally  Neuro: Grossly nonfocal  Data Reviewed: Basic Metabolic Panel:  Recent Labs Lab 04/07/15 1101 04/07/15 1426 04/07/15 1942 04/08/15 0342 04/09/15 0518  NA 135 137  --  133* 133*  K 4.4 4.1  --  3.4* 4.1  CL 104 106  --  104 107  CO2 21 19  --  21 21*  GLUCOSE 94 108*  --  109* 95  BUN 5* 7  --  10 10  CREATININE 0.88 0.92  --  1.03 1.09  CALCIUM 9.6 9.0  --  8.7 8.5*  MG  --   --  1.9  --   --   PHOS  --   --  3.1  --   --    Liver Function Tests:  Recent Labs Lab 04/07/15 1101 04/07/15 1426 04/08/15 0342 04/09/15 0518  AST 30 38* 26 23  ALT '16 18 16 ' 14*  ALKPHOS 71 67 65 52  BILITOT 0.6 0.7 2.2* 1.4*  PROT 8.7* 8.7* 7.8 7.1  ALBUMIN 4.1 4.0 3.6 3.1*   CBC:  Recent Labs Lab 04/07/15 1101  04/07/15 1426 04/08/15 0342 04/09/15 0518  WBC 11.4* 13.1* 10.7* 8.7  NEUTROABS  --  10.8*  --   --   HGB 6.8 Repeated and verified X2.* 6.4* 8.2* 8.0*  HCT 23.0 Repeated and verified X2.* 23.1* 28.6* 26.8*  MCV 60.6* 62.4* 68.4* 68.4*  PLT 286.0 290 252 203     Recent Results (from the past 240 hour(s))  MRSA PCR Screening     Status: None   Collection Time: 04/07/15  6:50 PM  Result Value Ref Range Status   MRSA by PCR NEGATIVE NEGATIVE Final  Culture, expectorated sputum-assessment     Status: None  Collection Time: 04/07/15  7:15 PM  Result Value Ref Range Status   Specimen Description SPUTUM  Final   Special Requests NONE  Final   Sputum evaluation   Final    THIS SPECIMEN IS ACCEPTABLE. RESPIRATORY CULTURE REPORT TO FOLLOW.   Report Status 04/07/2015 FINAL  Final     Scheduled Meds: . guaiFENesin  600 mg Oral BID  . levalbuterol  1.25 mg Nebulization QID  . lisinopril  20 mg Oral Daily  . methylPREDNISolone (SOLU-MEDROL) injection  80 mg Intravenous Q6H  . pantoprazole (PROTONIX) IV  40 mg Intravenous Q12H  . piperacillin-tazobactam  3.375 g Intravenous Once  . piperacillin-tazobactam (ZOSYN)  IV  3.375 g Intravenous Q8H  . vancomycin  750 mg Intravenous Q12H   Continuous Infusions:

## 2015-04-10 ENCOUNTER — Encounter (HOSPITAL_COMMUNITY): Payer: Self-pay | Admitting: Internal Medicine

## 2015-04-10 DIAGNOSIS — D649 Anemia, unspecified: Secondary | ICD-10-CM | POA: Insufficient documentation

## 2015-04-10 DIAGNOSIS — A419 Sepsis, unspecified organism: Secondary | ICD-10-CM | POA: Diagnosis present

## 2015-04-10 DIAGNOSIS — F17200 Nicotine dependence, unspecified, uncomplicated: Secondary | ICD-10-CM | POA: Diagnosis present

## 2015-04-10 DIAGNOSIS — J189 Pneumonia, unspecified organism: Secondary | ICD-10-CM | POA: Diagnosis present

## 2015-04-10 DIAGNOSIS — J441 Chronic obstructive pulmonary disease with (acute) exacerbation: Secondary | ICD-10-CM | POA: Diagnosis present

## 2015-04-10 DIAGNOSIS — R0603 Acute respiratory distress: Secondary | ICD-10-CM | POA: Diagnosis present

## 2015-04-10 DIAGNOSIS — E43 Unspecified severe protein-calorie malnutrition: Secondary | ICD-10-CM

## 2015-04-10 HISTORY — DX: Nicotine dependence, unspecified, uncomplicated: F17.200

## 2015-04-10 LAB — CBC
HCT: 27.9 % — ABNORMAL LOW (ref 39.0–52.0)
HEMOGLOBIN: 8.1 g/dL — AB (ref 13.0–17.0)
MCH: 20 pg — AB (ref 26.0–34.0)
MCHC: 29 g/dL — ABNORMAL LOW (ref 30.0–36.0)
MCV: 68.9 fL — AB (ref 78.0–100.0)
Platelets: 207 10*3/uL (ref 150–400)
RBC: 4.05 MIL/uL — ABNORMAL LOW (ref 4.22–5.81)
RDW: 25.4 % — ABNORMAL HIGH (ref 11.5–15.5)
WBC: 10.5 10*3/uL (ref 4.0–10.5)

## 2015-04-10 LAB — TYPE AND SCREEN
ABO/RH(D): A POS
Antibody Screen: NEGATIVE
UNIT DIVISION: 0
Unit division: 0

## 2015-04-10 LAB — COMPREHENSIVE METABOLIC PANEL
ALT: 16 U/L — AB (ref 17–63)
ANION GAP: 8 (ref 5–15)
AST: 22 U/L (ref 15–41)
Albumin: 3.4 g/dL — ABNORMAL LOW (ref 3.5–5.0)
Alkaline Phosphatase: 51 U/L (ref 38–126)
BILIRUBIN TOTAL: 0.7 mg/dL (ref 0.3–1.2)
BUN: 13 mg/dL (ref 6–20)
CO2: 22 mmol/L (ref 22–32)
CREATININE: 0.92 mg/dL (ref 0.61–1.24)
Calcium: 9.4 mg/dL (ref 8.9–10.3)
Chloride: 104 mmol/L (ref 101–111)
GFR calc non Af Amer: >60 mL/min (ref >60)
Glucose, Bld: 141 mg/dL — ABNORMAL HIGH (ref 70–99)
Potassium: 4.2 mmol/L (ref 3.5–5.1)
Sodium: 134 mmol/L — ABNORMAL LOW (ref 135–145)
TOTAL PROTEIN: 7.5 g/dL (ref 6.5–8.1)

## 2015-04-10 LAB — CULTURE, RESPIRATORY W GRAM STAIN

## 2015-04-10 LAB — HIV ANTIBODY (ROUTINE TESTING W REFLEX): HIV Screen 4th Generation wRfx: NONREACTIVE

## 2015-04-10 LAB — CULTURE, RESPIRATORY

## 2015-04-10 LAB — LEGIONELLA ANTIGEN, URINE

## 2015-04-10 MED ORDER — IPRATROPIUM BROMIDE 0.02 % IN SOLN
0.5000 mg | RESPIRATORY_TRACT | Status: DC
  Administered 2015-04-10: 0.5 mg via RESPIRATORY_TRACT

## 2015-04-10 MED ORDER — IPRATROPIUM BROMIDE 0.02 % IN SOLN
0.5000 mg | Freq: Four times a day (QID) | RESPIRATORY_TRACT | Status: DC
  Administered 2015-04-11 – 2015-04-13 (×9): 0.5 mg via RESPIRATORY_TRACT
  Filled 2015-04-10 (×9): qty 2.5

## 2015-04-10 MED ORDER — PANTOPRAZOLE SODIUM 40 MG PO TBEC
40.0000 mg | DELAYED_RELEASE_TABLET | Freq: Two times a day (BID) | ORAL | Status: DC
  Administered 2015-04-10 – 2015-04-13 (×6): 40 mg via ORAL
  Filled 2015-04-10 (×6): qty 1

## 2015-04-10 MED ORDER — GUAIFENESIN ER 600 MG PO TB12
1200.0000 mg | ORAL_TABLET | Freq: Two times a day (BID) | ORAL | Status: DC
  Administered 2015-04-10 – 2015-04-13 (×6): 1200 mg via ORAL
  Filled 2015-04-10 (×6): qty 2

## 2015-04-10 MED ORDER — IPRATROPIUM BROMIDE 0.02 % IN SOLN: 0.5000 mg | Freq: Four times a day (QID) | RESPIRATORY_TRACT | Status: DC | PRN

## 2015-04-10 MED ORDER — IPRATROPIUM BROMIDE 0.02 % IN SOLN: 0.5000 mg | RESPIRATORY_TRACT | Status: DC | PRN

## 2015-04-10 MED ORDER — LEVOFLOXACIN IN D5W 750 MG/150ML IV SOLN
750.0000 mg | INTRAVENOUS | Status: DC
  Administered 2015-04-10 – 2015-04-12 (×3): 750 mg via INTRAVENOUS
  Filled 2015-04-10 (×3): qty 150

## 2015-04-10 MED ORDER — IPRATROPIUM BROMIDE 0.02 % IN SOLN
RESPIRATORY_TRACT | Status: AC
  Filled 2015-04-10: qty 2.5

## 2015-04-10 MED ORDER — CEFTRIAXONE SODIUM IN DEXTROSE 20 MG/ML IV SOLN: 1.0000 g | INTRAVENOUS | Status: DC

## 2015-04-10 MED ORDER — METHYLPREDNISOLONE SODIUM SUCC 125 MG IJ SOLR
80.0000 mg | Freq: Three times a day (TID) | INTRAMUSCULAR | Status: DC
  Administered 2015-04-10 – 2015-04-12 (×5): 80 mg via INTRAVENOUS
  Filled 2015-04-10 (×5): qty 2

## 2015-04-10 NOTE — Progress Notes (Signed)
Pharmacy - Brief Note (IV to PO pantoprazole)  The patient is receiving Protonix by the intravenous route.  Based on criteria approved by the Pharmacy and [place], the medication is being converted to the equivalent oral dose form.  These criteria include: -No Active GI bleeding -Able to tolerate diet of full liquids (or better) or tube feeding -Able to tolerate other medications by the oral or enteral route  If you have any questions about this conversion, please contact the Pharmacy Department (phone [phone]).  Thank you.  Doreene Eland, PharmD, BCPS.   Pager: [phone]] 04/10/2015 .1:37 PM

## 2015-04-10 NOTE — Progress Notes (Signed)
[facility] PROGRESS NOTE  Vincent [name] WUX:[id number] DOB: Jun 23, 1961 DOA: 04/07/2015 PCP: Chari Manning, NP  Assessment/Plan: #1 sepsis Secondary to multifocal pneumonia secondary to Streptococcus pneumoniae. Patient still with some significant shortness of breath with some use of accessory muscles of respiration however speaking in full sentences.Urine strep pneumococcus antigen is negative. Urine Legionella antigen is negative. Sputum Gram stain and culture with abundant Streptococcus pneumonia sensitive to ceftriaxone and Levaquin however intermediate resistance to penicillin. Blood cultures with no growth to date. Continue oxygen. Discontinue IV vancomycin IV Zosyn and narrow down coverage to IV Levaquin. Increase Mucinex to 1200 mg twice a day. Add Atrovent nebs scheduled. Continue Xopenex nebulizers. Continue IV steroid taper. Follow.  #2 multifocal pneumonia Noted on CT of the chest. Patient still with some significant shortness of breath and some use of accessory muscles of respiration. Urine strep pneumococcus antigen is negative. Urine Legionella antigen is negative. Sputum Gram stain and culture with abundant Streptococcus pneumonia sensitive to Rocephin and Levaquin with intermediate resistance to penicillin. Continue oxygen. Add Atrovent to scheduled Xopenex nebs. Continue IV steroids. Narrow down antibiotics and discontinue IV vancomycin and IV Zosyn. Place on IV Levaquin. Increase Mucinex to 1200 mg twice daily. Follow.  #3 acute COPD exacerbation Patient with some wheezing on examination have has improved. Patient still with some significant shortness of breath or use of accessory muscles of respiration. Continue oxygen. Narrow down antibiotics to IV Levaquin. Increase Mucinex to 1200 mg twice daily. Add Atrovent nebulizers to Xopenex nebs. Follow. Patient will need to be scheduled for outpatient follow-up with pulmonary on discharge.  #4 acute respiratory distress Secondary to  problems #2 and 3. See problem #2 and 3.  #5 tobacco abuse Tobacco cessation stressed to patient. Continue nicotine patch.  #6 acute blood loss anemia in the setting of epistaxis Stable. No further epistaxis noted. Patient is status post 2 units packed red blood cells and hemoglobin currently stable at 8.1. Follow H&H. Transfusion threshold hemoglobin less than 7.  #7 polysubstance abuse Patient will use of alcohol, tobacco, THC. Patient on Ativan withdrawal protocol. Cessation has been discussed the patient verbalized understanding.  #8 transaminitis Likely alcohol induced. LFTs trending down. Follow.  #9 hypokalemia Repleted.  #10 severe protein calorie malnutrition Nutritional consulted.  #11 prophylaxis SCDs for DVT prophylaxis.   Code Status: Full Family Communication: Updated patient. No family at bedside. Disposition Plan: Home with shortness of breath has improved and patient is back to baseline. Will likely need to be set up for outpatient follow-up with pulmonary. Urine strep pneumococcus antigen is negative. Urine Legionella antigen is negative. Sputum Gram stain and culture   Consultants:  None  Procedures:  CT chest 04/07/2015  Chest x-ray 04/07/2015, 04/09/2015  Antibiotics:  IV Zosyn 04/07/2015>>> 04/10/2015  IV vancomycin 04/07/2015>>>> 04/10/2015  IV Levaquin 04/10/2015  HPI/Subjective: Patient still states shortness of breath with no significant change. Patient denies any chest pain. Patient with cough which is unchanged.  Objective: Filed Vitals:   04/10/15 1800  BP: 142/81  Pulse: 109  Temp: 98.1 F (36.7 C)  Resp: 20    Intake/Output Summary (Last 24 hours) at 04/10/15 2105 Last data filed at 04/10/15 1800  Gross per 24 hour  Intake    780 ml  Output    600 ml  Net    180 ml   Filed Weights   04/08/15 1531 04/09/15 0500 04/10/15 0443  Weight: 61.6 kg (135 lb 12.9 oz) 61.8 kg (136 lb 3.9 oz) 60.7 kg (133  lb 13.1 oz)     Exam:   General:  NAD. Some use of accessory muscles of respiration however speaking in full sentences.  Cardiovascular: Tachycardia  Respiratory: Minimal to mild expiratory wheezing. Some coarse breath sounds. No crackles.  Abdomen: Soft, nontender, nondistended, positive bowel sounds.  Musculoskeletal: No clubbing cyanosis or edema.  Data Reviewed: Basic Metabolic Panel:  Recent Labs Lab 04/07/15 1101 04/07/15 1426 04/07/15 1942 04/08/15 0342 04/09/15 0518 04/10/15 0431  NA 135 137  --  133* 133* 134*  K 4.4 4.1  --  3.4* 4.1 4.2  CL 104 106  --  104 107 104  CO2 21 19  --  21 21* 22  GLUCOSE 94 108*  --  109* 95 141*  BUN 5* 7  --  10 10 13   CREATININE 0.88 0.92  --  1.03 1.09 0.92  CALCIUM 9.6 9.0  --  8.7 8.5* 9.4  MG  --   --  1.9  --   --   --   PHOS  --   --  3.1  --   --   --    Liver Function Tests:  Recent Labs Lab 04/07/15 1101 04/07/15 1426 04/08/15 0342 04/09/15 0518 04/10/15 0431  AST 30 38* 26 23 22   ALT 16 18 16  14* 16*  ALKPHOS 71 67 65 52 51  BILITOT 0.6 0.7 2.2* 1.4* 0.7  PROT 8.7* 8.7* 7.8 7.1 7.5  ALBUMIN 4.1 4.0 3.6 3.1* 3.4*   No results for input(s): LIPASE, AMYLASE in the last 168 hours. No results for input(s): AMMONIA in the last 168 hours. CBC:  Recent Labs Lab 04/07/15 1101 04/07/15 1426 04/08/15 0342 04/09/15 0518 04/10/15 0431  WBC 11.4* 13.1* 10.7* 8.7 10.5  NEUTROABS  --  10.8*  --   --   --   HGB 6.8 Repeated and verified X2.* 6.4* 8.2* 8.0* 8.1*  HCT 23.0 Repeated and verified X2.* 23.1* 28.6* 26.8* 27.9*  MCV 60.6* 62.4* 68.4* 68.4* 68.9*  PLT 286.0 290 252 203 207   Cardiac Enzymes: No results for input(s): CKTOTAL, CKMB, CKMBINDEX, TROPONINI in the last 168 hours. BNP (last 3 results) No results for input(s): BNP in the last 8760 hours.  ProBNP (last 3 results) No results for input(s): PROBNP in the last 8760 hours.  CBG: No results for input(s): GLUCAP in the last 168 hours.  Recent Results  (from the past 240 hour(s))  Culture, Urine     Status: None   Collection Time: 04/07/15  6:04 PM  Result Value Ref Range Status   Specimen Description URINE, CLEAN CATCH  Final   Special Requests NONE  Final   Colony Count NO GROWTH Performed at [facility]   Final   Culture NO GROWTH Performed at [facility]   Final   Report Status 04/08/2015 FINAL  Final  MRSA PCR Screening     Status: None   Collection Time: 04/07/15  6:50 PM  Result Value Ref Range Status   MRSA by PCR NEGATIVE NEGATIVE Final    Comment:        The GeneXpert MRSA Assay (FDA approved for NASAL specimens only), is one component of a comprehensive MRSA colonization surveillance program. It is not intended to diagnose MRSA infection nor to guide or monitor treatment for MRSA infections.   Culture, expectorated sputum-assessment     Status: None   Collection Time: 04/07/15  7:15 PM  Result Value Ref Range Status   Specimen  Description SPUTUM  Final   Special Requests NONE  Final   Sputum evaluation   Final    THIS SPECIMEN IS ACCEPTABLE. RESPIRATORY CULTURE REPORT TO FOLLOW.   Report Status 04/07/2015 FINAL  Final  Culture, respiratory (NON-Expectorated)     Status: None   Collection Time: 04/07/15  7:15 PM  Result Value Ref Range Status   Specimen Description SPUTUM  Final   Special Requests NONE  Final   Gram Stain   Final    FEW WBC PRESENT, PREDOMINANTLY PMN RARE SQUAMOUS EPITHELIAL CELLS PRESENT MODERATE GRAM POSITIVE COCCI IN PAIRS AND CHAINS RARE GRAM POSITIVE RODS Performed at [facility]    Culture   Final    ABUNDANT STREPTOCOCCUS PNEUMONIAE Performed at [facility]    Report Status 04/10/2015 FINAL  Final   Organism ID, Bacteria STREPTOCOCCUS PNEUMONIAE  Final      Susceptibility   Streptococcus pneumoniae - MIC (ETEST)*    CEFTRIAXONE 0.25 SENSITIVE Sensitive     LEVOFLOXACIN 1.0 SENSITIVE Sensitive     PENICILLIN 0.38 INTERMEDIATE Intermediate      * ABUNDANT STREPTOCOCCUS PNEUMONIAE  Culture, blood (x 2)     Status: None (Preliminary result)   Collection Time: 04/07/15  7:42 PM  Result Value Ref Range Status   Specimen Description BLOOD LEFT ARM  Final   Special Requests AEB 4CC  Final   Culture   Final           BLOOD CULTURE RECEIVED NO GROWTH TO DATE CULTURE WILL BE HELD FOR 5 DAYS BEFORE ISSUING A FINAL NEGATIVE REPORT Performed at [facility]    Report Status PENDING  Incomplete  Culture, blood (x 2)     Status: None (Preliminary result)   Collection Time: 04/07/15  7:52 PM  Result Value Ref Range Status   Specimen Description BLOOD LEFT ARM  Final   Special Requests AEB 5CC  Final   Culture   Final           BLOOD CULTURE RECEIVED NO GROWTH TO DATE CULTURE WILL BE HELD FOR 5 DAYS BEFORE ISSUING A FINAL NEGATIVE REPORT Performed at [facility]    Report Status PENDING  Incomplete  Urine culture     Status: None   Collection Time: 04/08/15 10:01 PM  Result Value Ref Range Status   Specimen Description URINE, CLEAN CATCH  Final   Special Requests NONE  Final   Colony Count NO GROWTH Performed at [facility]   Final   Culture NO GROWTH Performed at [facility]   Final   Report Status 04/09/2015 FINAL  Final     Studies: Dg Chest 2 View  04/09/2015   CLINICAL DATA:  Progressive shortness of breath. Productive cough fever and chills. Followup pneumonia.  EXAM: CHEST  2 VIEW  COMPARISON:  04/07/2015  FINDINGS: Pulmonary hyperinflation again seen, consistent with COPD. Previously seen bilateral lower lobe infiltrates are decreased since previous study. No evidence of pleural effusion. No mass or lymphadenopathy identified. Heart size remains normal.  IMPRESSION: Resolving bilateral lower lobe infiltrates since previous study.  COPD.   Electronically Signed   By: Earle Gell M.D.   On: 04/09/2015 11:40    Scheduled Meds: . feeding supplement (ENSURE ENLIVE)  237 mL Oral BID BM  .  guaiFENesin  1,200 mg Oral BID  . [START ON 04/11/2015] ipratropium  0.5 mg Nebulization QID  . levalbuterol  1.25 mg Nebulization QID  . levofloxacin (LEVAQUIN)  IV  750 mg Intravenous Q24H  . lisinopril  20 mg Oral Daily  . methylPREDNISolone (SOLU-MEDROL) injection  80 mg Intravenous 3 times per day  . nicotine  21 mg Transdermal Daily  . pantoprazole  40 mg Oral BID   Continuous Infusions:   Principal Problem:   Sepsis Active Problems:   CAP (community acquired pneumonia)   Acute respiratory distress   Essential hypertension   Acute blood loss anemia   Protein-calorie malnutrition, severe   COPD exacerbation   Tobacco use disorder    Time spent: 37 minutes    Gretta Samons M.D. [facility] Pager [phone]. If 7PM-7AM, please contact night-coverage at [email or web address], password [facility] 04/10/2015, 9:05 PM  LOS: 3 days

## 2015-04-10 NOTE — Progress Notes (Signed)
ANTIBIOTIC CONSULT NOTE - follow-up  Pharmacy Consult for vancomycin and zosyn Indication: pneumonia  No Known Allergies  Patient Measurements: weight 57 kg, height 67 inches Height: 5\' 7"  (170.2 cm) Weight: 133 lb 13.1 oz (60.7 kg) IBW/kg (Calculated) : 66.1 Vital Signs: Temp: 98.3 F (36.8 C) (05/02 1030) Temp Source: Oral (05/02 1030) BP: 120/72 mmHg (05/02 1030) Pulse Rate: 101 (05/02 1030) Intake/Output from previous day: 05/01 0701 - 05/02 0700 In: 1384.2 [P.O.:600; I.V.:334.2; IV Piggyback:450] Out: 900 [Urine:900] Intake/Output from this shift: Total I/O In: 240 [P.O.:240] Out: -   Labs:  Recent Labs  04/08/15 0342 04/09/15 0518 04/10/15 0431  WBC 10.7* 8.7 10.5  HGB 8.2* 8.0* 8.1*  PLT 252 203 207  CREATININE 1.03 1.09 0.92   Estimated Creatinine Clearance: 79.7 mL/min (by C-G formula based on Cr of 0.92). No results for input(s): VANCOTROUGH, VANCOPEAK, VANCORANDOM, GENTTROUGH, GENTPEAK, GENTRANDOM, TOBRATROUGH, TOBRAPEAK, TOBRARND, AMIKACINPEAK, AMIKACINTROU, AMIKACIN in the last 72 hours.   Microbiology: Recent Results (from the past 720 hour(s))  Culture, Urine     Status: None   Collection Time: 04/07/15  6:04 PM  Result Value Ref Range Status   Specimen Description URINE, CLEAN CATCH  Final   Special Requests NONE  Final   Colony Count NO GROWTH Performed at [facility]   Final   Culture NO GROWTH Performed at [facility]   Final   Report Status 04/08/2015 FINAL  Final  MRSA PCR Screening     Status: None   Collection Time: 04/07/15  6:50 PM  Result Value Ref Range Status   MRSA by PCR NEGATIVE NEGATIVE Final    Comment:        The GeneXpert MRSA Assay (FDA approved for NASAL specimens only), is one component of a comprehensive MRSA colonization surveillance program. It is not intended to diagnose MRSA infection nor to guide or monitor treatment for MRSA infections.   Culture, expectorated sputum-assessment      Status: None   Collection Time: 04/07/15  7:15 PM  Result Value Ref Range Status   Specimen Description SPUTUM  Final   Special Requests NONE  Final   Sputum evaluation   Final    THIS SPECIMEN IS ACCEPTABLE. RESPIRATORY CULTURE REPORT TO FOLLOW.   Report Status 04/07/2015 FINAL  Final  Culture, respiratory (NON-Expectorated)     Status: None   Collection Time: 04/07/15  7:15 PM  Result Value Ref Range Status   Specimen Description SPUTUM  Final   Special Requests NONE  Final   Gram Stain   Final    FEW WBC PRESENT, PREDOMINANTLY PMN RARE SQUAMOUS EPITHELIAL CELLS PRESENT MODERATE GRAM POSITIVE COCCI IN PAIRS AND CHAINS RARE GRAM POSITIVE RODS Performed at [facility]    Culture   Final    ABUNDANT STREPTOCOCCUS PNEUMONIAE Performed at [facility]    Report Status 04/10/2015 FINAL  Final   Organism ID, Bacteria STREPTOCOCCUS PNEUMONIAE  Final      Susceptibility   Streptococcus pneumoniae - MIC (ETEST)*    CEFTRIAXONE 0.25 SENSITIVE Sensitive     LEVOFLOXACIN 1.0 SENSITIVE Sensitive     PENICILLIN 0.38 INTERMEDIATE Intermediate     * ABUNDANT STREPTOCOCCUS PNEUMONIAE  Culture, blood (x 2)     Status: None (Preliminary result)   Collection Time: 04/07/15  7:42 PM  Result Value Ref Range Status   Specimen Description BLOOD LEFT ARM  Final   Special Requests AEB 4CC  Final   Culture  Final           BLOOD CULTURE RECEIVED NO GROWTH TO DATE CULTURE WILL BE HELD FOR 5 DAYS BEFORE ISSUING A FINAL NEGATIVE REPORT Performed at [facility]    Report Status PENDING  Incomplete  Culture, blood (x 2)     Status: None (Preliminary result)   Collection Time: 04/07/15  7:52 PM  Result Value Ref Range Status   Specimen Description BLOOD LEFT ARM  Final   Special Requests AEB 5CC  Final   Culture   Final           BLOOD CULTURE RECEIVED NO GROWTH TO DATE CULTURE WILL BE HELD FOR 5 DAYS BEFORE ISSUING A FINAL NEGATIVE REPORT Performed at [facility]    Report Status PENDING  Incomplete  Urine culture     Status: None   Collection Time: 04/08/15 10:01 PM  Result Value Ref Range Status   Specimen Description URINE, CLEAN CATCH  Final   Special Requests NONE  Final   Colony Count NO GROWTH Performed at [facility]   Final   Culture NO GROWTH Performed at [facility]   Final   Report Status 04/09/2015 FINAL  Final    Medical History: Past Medical History  Diagnosis Date  . Glaucoma   . Hypertension     Assessment: Patient's a 54 y.o M who presented to his PCP office earlier today with c/o nosebleeds, cough, and weight loss. Lab work performed at the office showed low hgb and he was subsequently advised to come to the ED for further workup/management. In the ED, patient was found to be febrile and have elevated wbc. To start broad abx for suspected sepsis.  Vancomycin 1gm and zosyn 3.375gm x1 infuse over 30 minutes ordered by MD.  4/29 vanc>>  4/30 zosyn>>   4/29 sputum: abundant Strep pneumo (S to PCN, ceftriaxone, R to levofloxacin) 4/29 blood: ngtd  4/29 urine: ng-final  4/29: Strep pneumo neg/ Legionella neg   Goal of Therapy:  Vancomycin trough level 15-20 mcg/ml  Plan:  Day #3 vanco/zosyn - zozyn 3.375 gm IV q8h (infuse over 4 hours) - vancomycin 750 mg IV q12h - Based on sputum culture results, suggest change current antibiotics to ceftriaxone 1gm IV q24h (hold off checking vancomycin trough as anticipate change in antibiotics)  Doreene Eland, PharmD, BCPS.   Pager: [phone]  04/10/2015,1:26 PM

## 2015-04-11 LAB — COMPREHENSIVE METABOLIC PANEL
ALBUMIN: 3.1 g/dL — AB (ref 3.5–5.0)
ALK PHOS: 43 U/L (ref 38–126)
ALT: 26 U/L (ref 17–63)
AST: 36 U/L (ref 15–41)
Anion gap: 7 (ref 5–15)
BILIRUBIN TOTAL: 0.5 mg/dL (ref 0.3–1.2)
BUN: 13 mg/dL (ref 6–20)
CO2: 22 mmol/L (ref 22–32)
CREATININE: 0.82 mg/dL (ref 0.61–1.24)
Calcium: 9.2 mg/dL (ref 8.9–10.3)
Chloride: 106 mmol/L (ref 101–111)
GFR calc Af Amer: >60 mL/min (ref >60)
Glucose, Bld: 106 mg/dL — ABNORMAL HIGH (ref 70–99)
Potassium: 3.8 mmol/L (ref 3.5–5.1)
Sodium: 135 mmol/L (ref 135–145)
Total Protein: 7.1 g/dL (ref 6.5–8.1)

## 2015-04-11 LAB — CBC
HCT: 27 % — ABNORMAL LOW (ref 39.0–52.0)
HEMATOCRIT: UNDETERMINED % (ref 39.0–52.0)
Hemoglobin: UNDETERMINED g/dL (ref 13.0–17.0)
Hemoglobin: 7.9 g/dL — ABNORMAL LOW (ref 13.0–17.0)
MCH: UNDETERMINED pg (ref 26.0–34.0)
MCH: 20.4 pg — ABNORMAL LOW (ref 26.0–34.0)
MCHC: UNDETERMINED g/dL (ref 30.0–36.0)
MCHC: 29.3 g/dL — ABNORMAL LOW (ref 30.0–36.0)
MCV: UNDETERMINED fL (ref 78.0–100.0)
MCV: 69.8 fL — ABNORMAL LOW (ref 78.0–100.0)
Platelets: UNDETERMINED 10*3/uL (ref 150–400)
Platelets: 165 10*3/uL (ref 150–400)
RBC: UNDETERMINED MIL/uL (ref 4.22–5.81)
RBC: 3.87 MIL/uL — ABNORMAL LOW (ref 4.22–5.81)
RDW: UNDETERMINED % (ref 11.5–15.5)
RDW: 26.4 % — ABNORMAL HIGH (ref 11.5–15.5)
WBC: UNDETERMINED 10*3/uL (ref 4.0–10.5)
WBC: 15.1 10*3/uL — AB (ref 4.0–10.5)

## 2015-04-11 NOTE — Evaluation (Signed)
Physical Therapy One Time Evaluation Patient Details Name: Vincent [name] MRN: [id number] DOB: Aug 29, 1961 Today's Date: 04/11/2015   History of Present Illness  Pt is a 54 year old male with hx of polysubstance abuse and admitted for sepsis secondary to multifocal pneumonia secondary to Streptococcus pneumoniae.  Clinical Impression  Patient evaluated by Physical Therapy with no further acute PT needs identified. All education has been completed and the patient has no further questions. Pt ambulated around unit without difficulty and denies SOB with activity at this time.  SpO2 remained 98-100% on room air during ambulation.  No further follow-up Physical Therapy or equipment needs.  Pt agreeable to ambulate with family during acute stay. PT is signing off. Thank you for this referral.      Follow Up Recommendations No PT follow up    Equipment Recommendations  None recommended by PT    Recommendations for Other Services       Precautions / Restrictions Precautions Precautions: None      Mobility  Bed Mobility Overal bed mobility: Modified Independent                Transfers Overall transfer level: Modified independent                  Ambulation/Gait Ambulation/Gait assistance: Supervision;Modified independent (Device/Increase time) Ambulation Distance (Feet): 400 Feet Assistive device: None       General Gait Details: pt pushed IV pole, denies any symptoms including SOB, SPO2 remained 98-100% on room air during ambulation  Stairs            Wheelchair Mobility    Modified Rankin (Stroke Patients Only)       Balance Overall balance assessment: No apparent balance deficits (not formally assessed)                                           Pertinent Vitals/Pain Pain Assessment: No/denies pain    Home Living Family/patient expects to be discharged to:: Private residence Living Arrangements: Spouse/significant other            Home Layout: One level Home Equipment: None      Prior Function Level of Independence: Independent               Hand Dominance        Extremity/Trunk Assessment               Lower Extremity Assessment: Overall WFL for tasks assessed         Communication   Communication: No difficulties  Cognition Arousal/Alertness: Awake/alert Behavior During Therapy: WFL for tasks assessed/performed Overall Cognitive Status: Within Functional Limits for tasks assessed                      General Comments      Exercises        Assessment/Plan    PT Assessment Patent does not need any further PT services  PT Diagnosis     PT Problem List    PT Treatment Interventions     PT Goals (Current goals can be found in the Care Plan section) Acute Rehab PT Goals PT Goal Formulation: All assessment and education complete, DC therapy    Frequency     Barriers to discharge        Co-evaluation  End of Session   Activity Tolerance: Patient tolerated treatment well Patient left: with call bell/phone within reach;with family/visitor present;in bed           Time: 1051-1100 PT Time Calculation (min) (ACUTE ONLY): 9 min   Charges:   PT Evaluation $Initial PT Evaluation Tier I: 1 Procedure     PT G Codes:        Lavere Shinsky,KATHrine E 04/11/2015, 11:45 AM Carmelia Bake, PT, DPT 04/11/2015 Pager: [phone]

## 2015-04-11 NOTE — Progress Notes (Signed)
Patient ID: Vincent [name], male   DOB: 08-27-1961, 54 y.o.   MRN: [id number]  [facility] PROGRESS NOTE  Vincent [name] [id number]:[id number] DOB: 10-25-1961 DOA: 04/07/2015 PCP: Chari Manning, NP   Brief narrative:    54 yo male, smoker and currently drink pack of beer per week, presented to [facility] ED (from PCP office) for evaluation of one day duration of progressively worsening nose bleeding. Pt explains he has noted progressive weakness over the past month with unintentional weight loss about 5-10 lbs, poor oral intake. Over the past weeks he started to experience progressively worsening dyspnea, initially present with exertion and has progressed to dyspnea at rest in the past 24 hours, associated with productive cough of yellow sputum, subjective fevers, chills, chest tightness worse with coughing spells. Pt denies any known sick contacts or exposures, no similar events in the past. Pt denies any specific abd or urinary concerns, no specific focal neurological symptoms.   In ED, pt noted to be in mild distress, VS notable for T 100.80F, HR 126, RR 30, Blood work notable for Hg 6.8 (2 U PRBC requested by ED doctor). [facility] asked to admit to SDU for further evaluation.   Major events since admission: 5/1 - wheezing with more course breaths sounds, requiring initiating solumedrol and repeat CXR  Assessment/Plan:    Principal Problem:  Sepsis secondary to multilobar PNA - criteria for sepsis met on admission with T 100.4 F, HR 126, RR 30, WBC 13.1, lactic acid 2.1 - sepsis order set in place, pt started on vancomycin and zosyn for presumptive PNA - CXR with no signs of PNA but CT chest confirmed our suspicion, multifocal PNA - Urine strep pneumococcus antigen is negative. Urine Legionella antigen is negative, blood culture negative to date - Sputum Gram stain and culture with abundant Strep pneumonia sensitive to Levaquin and with intermediate resistance to penicillin - IV vancomycin and zosyn stopped  5/2 and pt started on Levaquin  - as pt still with significant exertional dyspnea will continue Steroids IV and possibly start tapering down in next 24 hours based on respiratory status  - continue BD's scheduled and as needed  Acute blood loss anemia in the setting of nose bleeding, microcytic anemia,? IDA - unclear if additional source present  - anemia panel with Iron 48 (which is on low end of normal) - status post transfusion of 2 U PRBC on admission with appropriate increase in post transfusion Hg from 6.4 --> 8.2 --> 7.9 - no further nose bleeds  - continue Protonix BID IV - if Hg < 7, may need additional PRBC transfusion  Acute respiratory failure imposed on COPD  - secondary to PNA imposed on acute COPD  - continue solumedrol with no planned tapering today as pt still with expiratory wheezing and exertional dyspnea  - possible taper in the next 24 hours based on respiratory status  Polysubstance abuse  - alcohol and tobacco, THC - keep on CIWA protocol  - discussed cessation and pt has verbalized understanding  HTN - has known diagnosis but not taking medications  - Added lisinopril for better blood pressure control, blood pressure reasonably stable this morning Transaminitis  - alcohol induced, LFT's trending down  Hypokalemia - supplemented and within normal limits this morning Severe PCM - with recent weight loss of unclear etiology - nutritionist consulted and assistance appreciated, continue to provide nutritional supplements  DVT prophylaxis  - SCD's  Code Status: Full.  Family Communication:  plan of care discussed with the  patient adn wife at bedside  Disposition Plan:  not ready for discharge at this time due continued requirement for IV steroids   IV access:  Peripheral IV  Procedures and diagnostic studies:    Dg Chest 2 View  04/07/2015  Findings concerning for chronic obstructive pulmonary disease. No acute cardiopulmonary abnormality seen.    Ct Chest Wo  Contrast  04/07/2015   Patchy interstitial infiltrates most likely related to multi focal pneumonia. No significant adenopathy.     Medical Consultants:  None   Other Consultants:  None   IAnti-Infectives:   Vancomycin 4/29 --> 5/2 Zosyn 4/29 --> 5/2 Levaquin 5/2 -->  Faye Ramsay, MD  [facility] Pager [phone]  If 7PM-7AM, please contact night-coverage [email or web address] Password [email or web address] 04/11/2015, 7:59 PM   LOS: 4 days   HPI/Subjective: No events overnight. Still with exertional dyspnea and productive cough, more wheezing this AM Objective: Filed Vitals:   04/11/15 0802 04/11/15 1156 04/11/15 1358 04/11/15 1637  BP:   135/76   Pulse:   115   Temp:   98 F (36.7 C)   TempSrc:   Oral   Resp:   22   Height:      Weight:      SpO2: 93% 98% 95% 99%    Intake/Output Summary (Last 24 hours) at 04/11/15 1959 Last data filed at 04/11/15 1400  Gross per 24 hour  Intake    870 ml  Output    400 ml  Net    470 ml    Exam:   General:  Pt is alert, follows commands appropriately, not in acute distress  Cardiovascular: Regular rate and rhythm, S1/S2, no murmurs, no rubs, no gallops  Respiratory: expiratory wheezing with rhonchi at bases  Abdomen: Soft, non tender, non distended, bowel sounds present, no guarding  Extremities: No edema, pulses DP and PT palpable bilaterally  Neuro: Grossly nonfocal  Data Reviewed: Basic Metabolic Panel:  Recent Labs Lab 04/07/15 1426 04/07/15 1942 04/08/15 0342 04/09/15 0518 04/10/15 0431 04/11/15 0609  NA 137  --  133* 133* 134* 135  K 4.1  --  3.4* 4.1 4.2 3.8  CL 106  --  104 107 104 106  CO2 19  --  21 21* 22 22  GLUCOSE 108*  --  109* 95 141* 106*  BUN 7  --  _0 CREATININE 0.92  --  1.03 1.09 0.92 0.82  CALCIUM 9.0  --  8.7 8.5* 9.4 9.2  MG  --  1.9  --   --   --   --   PHOS  --  3.1  --   --   --   --    Liver Function Tests:  Recent Labs Lab 04/07/15 1426 04/08/15 0342 04/09/15 0518 04/10/15 0431  04/11/15 0609  AST 38* _1 36  ALT 18 16 14* 16* 26  ALKPHOS 67 65 52 51 43  BILITOT 0.7 2.2* 1.4* 0.7 0.5  PROT 8.7* 7.8 7.1 7.5 7.1  ALBUMIN 4.0 3.6 3.1* 3.4* 3.1*   CBC:  Recent Labs Lab 04/07/15 1426 04/08/15 0342 04/09/15 0518 04/10/15 0431 04/11/15 0609  WBC 13.1* 10.7* 8.7 10.5 15.1*  NEUTROABS 10.8*  --   --   --   --   HGB 6.4* 8.2* 8.0* 8.1* 7.9*  HCT 23.1* 28.6* 26.8* 27.9* 27.0*  MCV 62.4* 68.4* 68.4* 68.9* 69.8*  PLT 290 252 203 207 165     Recent Results (from the  past 240 hour(s))  MRSA PCR Screening     Status: None   Collection Time: 04/07/15  6:50 PM  Result Value Ref Range Status   MRSA by PCR NEGATIVE NEGATIVE Final  Culture, expectorated sputum-assessment     Status: None   Collection Time: 04/07/15  7:15 PM  Result Value Ref Range Status   Specimen Description SPUTUM  Final   Special Requests NONE  Final   Sputum evaluation   Final    THIS SPECIMEN IS ACCEPTABLE. RESPIRATORY CULTURE REPORT TO FOLLOW.   Report Status 04/07/2015 FINAL  Final     Scheduled Meds: . feeding supplement (ENSURE ENLIVE)  237 mL Oral BID BM  . guaiFENesin  1,200 mg Oral BID  . ipratropium  0.5 mg Nebulization QID  . levalbuterol  1.25 mg Nebulization QID  . levofloxacin (LEVAQUIN) IV  750 mg Intravenous Q24H  . lisinopril  20 mg Oral Daily  . methylPREDNISolone (SOLU-MEDROL) injection  80 mg Intravenous 3 times per day  . nicotine  21 mg Transdermal Daily  . pantoprazole  40 mg Oral BID   Continuous Infusions:

## 2015-04-11 NOTE — Progress Notes (Signed)
Occupational Therapy Evaluation Patient Details Name: Emit Kuenzel MRN: [id number] DOB: 1961-01-25 Today's Date: 04/11/2015    History of Present Illness Pt is a 54 year old male with hx of polysubstance abuse and admitted for sepsis secondary to multifocal pneumonia secondary to Streptococcus pneumoniae.   Clinical Impression   PTA, pt independent with ADL and mobility. Pt close to baseline level of function. Fatigues with increased activity. O2 Sats @ 96-100 during mobility. HR increased to 119. Educated on E conservation. Pt will be safe to D/C home with intermittent S when medically stable.     Follow Up Recommendations  No OT follow up;Supervision - Intermittent    Equipment Recommendations  None recommended by OT    Recommendations for Other Services       Precautions / Restrictions Precautions Precautions: None Restrictions Weight Bearing Restrictions: No      Mobility Bed Mobility Overal bed mobility: Modified Independent                Transfers Overall transfer level: Modified independent                    Balance Overall balance assessment: No apparent balance deficits (not formally assessed)                                          ADL Overall ADL's : At baseline (Requires increased time )                                       General ADL Comments: Fatigues easily. discussed E conservation. discussed recommendation for pt to use showerchair intitially after D/C - pt states he can "get on e".      Vision  Blind R eye              Pertinent Vitals/Pain Pain Assessment: No/denies pain  HR 119 with increased activity     Hand Dominance Right   Extremity/Trunk Assessment Upper Extremity Assessment Upper Extremity Assessment: Overall WFL for tasks assessed   Lower Extremity Assessment Lower Extremity Assessment: Overall WFL for tasks assessed       Communication Communication Communication:  No difficulties   Cognition Arousal/Alertness: Awake/alert Behavior During Therapy: WFL for tasks assessed/performed Overall Cognitive Status: Within Functional Limits for tasks assessed                                        Home Living Family/patient expects to be discharged to:: Private residence Living Arrangements: Spouse/significant other Available Help at Discharge: Available PRN/intermittently Type of Home: House Home Access: Stairs to enter Technical brewer of Steps: 3   Home Layout: One level     Bathroom Shower/Tub: Occupational psychologist: Standard Bathroom Accessibility: No   Home Equipment: None          Prior Functioning/Environment Level of Independence: Independent             OT Diagnosis: Generalized weakness   OT Problem List: Decreased activity tolerance   OT Treatment/Interventions:      OT Goals(Current goals can be found in the care plan section) Acute Rehab OT Goals Patient Stated Goal: to get stronger OT Goal Formulation: All  assessment and education complete, DC therapy  OT Frequency:     Barriers to D/C:            Co-evaluation              End of Session Equipment Utilized During Treatment: Gait belt Nurse Communication: Mobility status  Activity Tolerance: Patient tolerated treatment well Patient left: in chair;with call bell/phone within reach   Time: 1173-5670 OT Time Calculation (min): 15 min Charges:    G-Codes:    Ky Moskowitz,HILLARY Apr 16, 2015, 4:33 PM   [facility], OTR/L  [phone] Apr 16, 2015

## 2015-04-12 DIAGNOSIS — J441 Chronic obstructive pulmonary disease with (acute) exacerbation: Secondary | ICD-10-CM

## 2015-04-12 DIAGNOSIS — J189 Pneumonia, unspecified organism: Secondary | ICD-10-CM

## 2015-04-12 LAB — CBC
HCT: 28.1 % — ABNORMAL LOW (ref 39.0–52.0)
Hemoglobin: 8.2 g/dL — ABNORMAL LOW (ref 13.0–17.0)
MCH: 20.4 pg — AB (ref 26.0–34.0)
MCHC: 29.2 g/dL — ABNORMAL LOW (ref 30.0–36.0)
MCV: 70.1 fL — ABNORMAL LOW (ref 78.0–100.0)
PLATELETS: 156 10*3/uL (ref 150–400)
RBC: 4.01 MIL/uL — ABNORMAL LOW (ref 4.22–5.81)
RDW: 26.8 % — ABNORMAL HIGH (ref 11.5–15.5)
WBC: 10.2 10*3/uL (ref 4.0–10.5)

## 2015-04-12 LAB — BASIC METABOLIC PANEL
Anion gap: 9 (ref 5–15)
BUN: 18 mg/dL (ref 6–20)
CO2: 23 mmol/L (ref 22–32)
CREATININE: 0.82 mg/dL (ref 0.61–1.24)
Calcium: 9.4 mg/dL (ref 8.9–10.3)
Chloride: 105 mmol/L (ref 101–111)
Glucose, Bld: 121 mg/dL — ABNORMAL HIGH (ref 70–99)
Potassium: 4.1 mmol/L (ref 3.5–5.1)
Sodium: 137 mmol/L (ref 135–145)

## 2015-04-12 MED ORDER — PREDNISONE 20 MG PO TABS
40.0000 mg | ORAL_TABLET | Freq: Every day | ORAL | Status: DC
  Administered 2015-04-13: 40 mg via ORAL
  Filled 2015-04-12: qty 2

## 2015-04-12 NOTE — Progress Notes (Signed)
[facility] PROGRESS NOTE  Vincent [name] [id number] DOB: November 04, 1961 DOA: 04/07/2015 PCP: Chari Manning, NP  HPI 54 yo male, smoker and currently drink pack of beer per week, presented to [facility] ED (from PCP office) for evaluation of one day duration of progressively worsening nose bleeding. Pt explains he has noted progressive weakness over the past month with unintentional weight loss about 5-10 lbs, poor oral intake. Over the past weeks he started to experience progressively worsening dyspnea, initially present with exertion and has progressed to dyspnea at rest in the past 24 hours, associated with productive cough of yellow sputum, subjective fevers, chills, chest tightness worse with coughing spells. Pt denies any known sick contacts or exposures, no similar events in the past. Pt denies any specific abd or urinary concerns, no specific focal neurological symptoms.   In ED, pt noted to be in mild distress, VS notable for T 100.47F, HR 126, RR 30, Blood work notable for Hg 6.8 (2 U PRBC requested by ED doctor). [facility] asked to admit to SDU for further evaluation.   Major events since admission: 5/1 - wheezing with more course breaths sounds, requiring initiating solumedrol and repeat CXR  HPI/Subjective: - No events overnight.  - Still with exertional dyspnea  - wheezing has improved   Assessment/Plan Sepsis secondary to multilobar PNA - criteria for sepsis met on admission with T 100.4 F, HR 126, RR 30, WBC 13.1, lactic acid 2.1 - sepsis order set in place, pt started on vancomycin and zosyn for presumptive PNA - CXR with no signs of PNA but CT chest confirmed our suspicion, multifocal PNA - Urine strep pneumococcus antigen is negative. Urine Legionella antigen is negative, blood culture negative to date - Sputum Gram stain and culture with abundant Strep pneumonia sensitive to Levaquin and with intermediate resistance to penicillin - IV vancomycin and zosyn stopped 5/2 and pt  started on Levaquin  - as pt still with significant exertional dyspnea but slowly improving.  - continue BD's scheduled and as needed  - transition to Prednisone today, if stable can go home 5/5 am  Acute blood loss anemia in the setting of nose bleeding, microcytic anemia,? IDA - unclear if additional source present  - anemia panel with Iron 48 (which is on low end of normal) - status post transfusion of 2 U PRBC on admission with appropriate increase in post transfusion Hg from 6.4 --> 8.2 --> 7.9 >> 8.2 - no further nose bleeds  - continue Protonix BID IV - if Hg < 7, may need additional PRBC transfusion   Acute respiratory failure imposed on COPD  - secondary to PNA imposed on acute COPD  - change to prednisone today   Polysubstance abuse  - alcohol and tobacco, THC - keep on CIWA protocol  - discussed cessation and pt has verbalized understanding   HTN - has known diagnosis but not taking medications  - Added lisinopril for better blood pressure control, blood pressure reasonably stable  Transaminitis  - alcohol induced, LFT's trending down   Hypokalemia - supplemented and within normal limits this morning  Severe PCM - with recent weight loss of unclear etiology - nutritionist consulted and assistance appreciated, continue to provide nutritional supplements   DVT prophylaxis  - SCD's   Code Status: Full.  Family Communication:  plan of care discussed with the patient adn wife at bedside  Disposition Plan: likely ready for d/c 5/5  Procedures Dg Chest 2 View  04/07/2015  Findings concerning for chronic  obstructive pulmonary disease. No acute cardiopulmonary abnormality seen.    Ct Chest Wo Contrast  04/07/2015   Patchy interstitial infiltrates most likely related to multi focal pneumonia. No significant adenopathy.     Consultants - none  Antibiotics Vancomycin 4/29 --> 5/2 Zosyn 4/29 --> 5/2 Levaquin 5/2 -->   Objective: Filed Vitals:   04/12/15 0752  04/12/15 1210 04/12/15 1435 04/12/15 1600  BP:   133/77   Pulse:   100   Temp:   97.9 F (36.6 C)   TempSrc:   Oral   Resp:   20   Height:      Weight:      SpO2: 98% 95% 100% 99%    Intake/Output Summary (Last 24 hours) at 04/12/15 1835 Last data filed at 04/12/15 1440  Gross per 24 hour  Intake    630 ml  Output    850 ml  Net   -220 ml   Exam:  General:  Pt is alert, follows commands appropriately, not in acute distress  Cardiovascular: Regular rate and rhythm, S1/S2, no murmurs, no rubs, no gallops  Respiratory: moves air well, overall decreased breath sounds, no wheezing  Abdomen: Soft, non tender, non distended, bowel sounds present, no guarding  Extremities: No edema, pulses DP and PT palpable bilaterally  Neuro: Grossly nonfocal  Data Reviewed: Basic Metabolic Panel:  Recent Labs Lab 04/07/15 1942 04/08/15 0342 04/09/15 0518 04/10/15 0431 04/11/15 0609 04/12/15 0423  NA  --  133* 133* 134* 135 137  K  --  3.4* 4.1 4.2 3.8 4.1  CL  --  104 107 104 106 105  CO2  --  21 21* '22 22 23  ' GLUCOSE  --  109* 95 141* 106* 121*  BUN  --  '10 10 13 13 18  ' CREATININE  --  1.03 1.09 0.92 0.82 0.82  CALCIUM  --  8.7 8.5* 9.4 9.2 9.4  MG 1.9  --   --   --   --   --   PHOS 3.1  --   --   --   --   --    Liver Function Tests:  Recent Labs Lab 04/07/15 1426 04/08/15 0342 04/09/15 0518 04/10/15 0431 04/11/15 0609  AST 38* '26 23 22 ' 36  ALT 18 16 14* 16* 26  ALKPHOS 67 65 52 51 43  BILITOT 0.7 2.2* 1.4* 0.7 0.5  PROT 8.7* 7.8 7.1 7.5 7.1  ALBUMIN 4.0 3.6 3.1* 3.4* 3.1*   CBC:  Recent Labs Lab 04/07/15 1426 04/08/15 0342 04/09/15 0518 04/10/15 0431 04/11/15 0609  WBC 13.1* 10.7* 8.7 10.5 15.1*  NEUTROABS 10.8*  --   --   --   --   HGB 6.4* 8.2* 8.0* 8.1* 7.9*  HCT 23.1* 28.6* 26.8* 27.9* 27.0*  MCV 62.4* 68.4* 68.4* 68.9* 69.8*  PLT 290 252 203 207 165     Recent Results (from the past 240 hour(s))  MRSA PCR Screening     Status: None    Collection Time: 04/07/15  6:50 PM  Result Value Ref Range Status   MRSA by PCR NEGATIVE NEGATIVE Final  Culture, expectorated sputum-assessment     Status: None   Collection Time: 04/07/15  7:15 PM  Result Value Ref Range Status   Specimen Description SPUTUM  Final   Special Requests NONE  Final   Sputum evaluation   Final    THIS SPECIMEN IS ACCEPTABLE. RESPIRATORY CULTURE REPORT TO FOLLOW.   Report Status 04/07/2015 FINAL  Final     Scheduled Meds: . feeding supplement (ENSURE ENLIVE)  237 mL Oral BID BM  . guaiFENesin  1,200 mg Oral BID  . ipratropium  0.5 mg Nebulization QID  . levalbuterol  1.25 mg Nebulization QID  . levofloxacin (LEVAQUIN) IV  750 mg Intravenous Q24H  . lisinopril  20 mg Oral Daily  . nicotine  21 mg Transdermal Daily  . pantoprazole  40 mg Oral BID  . [START ON 04/13/2015] predniSONE  40 mg Oral Q breakfast   Continuous Infusions:     Costin M. Cruzita Lederer, MD [facility] [phone]  If 7PM-7AM, please contact night-coverage [email or web address] Password [facility] 04/12/2015, 6:35 PM

## 2015-04-13 DIAGNOSIS — J80 Acute respiratory distress syndrome: Secondary | ICD-10-CM

## 2015-04-13 DIAGNOSIS — A403 Sepsis due to Streptococcus pneumoniae: Secondary | ICD-10-CM

## 2015-04-13 MED ORDER — ALBUTEROL SULFATE HFA 108 (90 BASE) MCG/ACT IN AERS: 2.0000 | INHALATION_SPRAY | Freq: Four times a day (QID) | RESPIRATORY_TRACT | Status: DC | PRN

## 2015-04-13 MED ORDER — GUAIFENESIN 100 MG/5ML PO SOLN: 200.0000 mg | ORAL | Status: DC | PRN

## 2015-04-13 MED ORDER — LEVOFLOXACIN 750 MG PO TABS: 750.0000 mg | ORAL_TABLET | Freq: Every day | ORAL | Status: DC

## 2015-04-13 MED ORDER — PREDNISONE 20 MG PO TABS: 40.0000 mg | ORAL_TABLET | Freq: Every day | ORAL | Status: DC

## 2015-04-13 MED ORDER — TIOTROPIUM BROMIDE MONOHYDRATE 18 MCG IN CAPS: 18.0000 ug | ORAL_CAPSULE | Freq: Every day | RESPIRATORY_TRACT | Status: DC

## 2015-04-13 MED ORDER — IPRATROPIUM BROMIDE 0.02 % IN SOLN: 0.5000 mg | Freq: Two times a day (BID) | RESPIRATORY_TRACT | Status: DC

## 2015-04-13 MED ORDER — LEVALBUTEROL HCL 1.25 MG/0.5ML IN NEBU
1.2500 mg | INHALATION_SOLUTION | Freq: Two times a day (BID) | RESPIRATORY_TRACT | Status: DC
  Filled 2015-04-13 (×2): qty 0.5

## 2015-04-13 NOTE — Progress Notes (Signed)
Pt discharged home, discharge instruction and prescription given to patient. Pt has no further concerns

## 2015-04-13 NOTE — Progress Notes (Signed)
Nutrition Follow-up  DOCUMENTATION CODES:  Severe malnutrition in context of acute illness/injury  INTERVENTION:  Ensure Enlive (each supplement provides 350kcal and 20 grams of protein), Magic cup (BID)  NUTRITION DIAGNOSIS:  Malnutrition related to other (see comment) (poor appetite) as evidenced by moderate depletion of body fat, severe depletion of muscle mass.  GOAL:  Patient will meet greater than or equal to 90% of their needs  MONITOR:  PO intake, Supplement acceptance, Labs, Weight trends, I & O's  REASON FOR ASSESSMENT:  Consult, Malnutrition Screening Tool Assessment of nutrition requirement/status  ASSESSMENT: Pt seen for follow-up. Per chart review, pt eating 100% at most meals the past few days. Pt reports that he has a good appetite and that he has gained a few pounds since admission; talked with pt about the need for extra kcal and protein for healing. Pt states he likes the Ensure and Magic Cup and asks where he can buy Ensure on d/c. Pt meeting needs. Labs and medications reviewed.  Height:  Ht Readings from Last 1 Encounters:  04/08/15 5\' 7"  (1.702 m)    Weight:  Wt Readings from Last 1 Encounters:  04/13/15 132 lb 8 oz (60.102 kg)    Ideal Body Weight:  67.3 kg  Wt Readings from Last 10 Encounters:  04/13/15 132 lb 8 oz (60.102 kg)  04/07/15 125 lb 12.8 oz (57.063 kg)  10/19/14 133 lb (60.328 kg)  09/21/14 133 lb 6.4 oz (60.51 kg)  08/16/14 132 lb (59.875 kg)  09/06/13 139 lb (63.05 kg)  07/05/13 143 lb 12.8 oz (65.227 kg)    BMI:  Body mass index is 20.75 kg/(m^2).  Estimated Nutritional Needs:  Kcal:  1600-1800  Protein:  85-95g  Fluid:  1.6L/day  Skin:  Reviewed, no issues  Diet Order:  Diet regular Room service appropriate?: Yes; Fluid consistency:: Thin  EDUCATION NEEDS:  No education needs identified at this time   Intake/Output Summary (Last 24 hours) at 04/13/15 1032 Last data filed at 04/13/15 0700  Gross per 24  hour  Intake    600 ml  Output    750 ml  Net   -150 ml    Last BM:  5/3   Jarome Matin, RD, LDN Inpatient Clinical Dietitian Pager # [phone] After hours/weekend pager # [phone]

## 2015-04-13 NOTE — Discharge Summary (Signed)
Physician Discharge Summary  Vincent [name] JQZ:[id number] DOB: [birth year] DOA: 04/07/2015  PCP: Chari Manning, NP  Admit date: 04/07/2015 Discharge date: 04/13/2015  Time spent: > 30 minutes  Recommendations for Outpatient Follow-up:  1. Follow up with PCP in 1-2 weeks 2. Continue Levofloxacin for 3 additional days  3. Continue Prednisone taper  4. Patient started on Spiriva and Albuterol on d/c 5. Please consider Pulmonology referral once acute episode resolves  Discharge Diagnoses:  Principal Problem:   Sepsis Active Problems:   Essential hypertension   Acute blood loss anemia   Protein-calorie malnutrition, severe   CAP (community acquired pneumonia)   COPD exacerbation   Tobacco use disorder   Acute respiratory distress   Symptomatic anemia  Discharge Condition: stable  Diet recommendation: regular  Filed Weights   04/11/15 0458 04/12/15 0500 04/13/15 0619  Weight: 58.7 kg (129 lb 6.6 oz) 58.968 kg (130 lb) 60.102 kg (132 lb 8 oz)    History of present illness:  54 yo male, smoker and currently drink pack of beer per week, presented to [facility] ED (from PCP office) for evaluation of one day duration of progressively worsening nose bleeding. Pt explains he has noted progressive weakness over the past month with unintentional weight loss about 5-10 lbs, poor oral intake. Over the past weeks he started to experience progressively worsening dyspnea, initially present with exertion and has progressed to dyspnea at rest in the past 24 hours, associated with productive cough of yellow sputum, subjective fevers, chills, chest tightness worse with coughing spells. Pt denies any known sick contacts or exposures, no similar events in the past. Pt denies any specific abd or urinary concerns, no specific focal neurological symptoms. In ED, pt noted to be in mild distress, VS notable for T 100.23F, HR 126, RR 30, Blood work notable for Hg 6.8 (2 U PRBC requested by ED doctor). TRH asked to admit  to SDU for further evaluation.   Hospital Course:  Sepsis secondary to multilobar PNA - patient was admitted to the hospital with sepsis due to multilobar pneumonia as evidenced by CT chest, and was initially started on Vancomycin and Zosyn. His sputum stain and culture showed abundant Strep pneumonia sensitive to Levaquin and with intermediate resistance to penicillin, and his antibiotics were narrowed to levofloxacin. He was clinically slow to improve requiring IV steroids for presumed COPD as well, however he was able to be weaned off of oxygen and satting well on room air. Repeat CXR on 5/1 showed improvement in his bilateral lower lobe infiltrated. He will be discharged home with 3 additional days of Levofloxacin to complete a total 8 day course of antibiotics.  Acute blood loss anemia in the setting of nose bleeding, microcytic anemia,? IDA- unclear if additional source present, status post transfusion of 2 U PRBC on admission with appropriate increase in post transfusion Hg from 6.4 --> 8.2 --> 7.9 >> 8.2 >> 8.2. Patient had no further nose bleeds, and will recommend outpatient repeat CBC at his next appointment.  Acute respiratory failure imposed on COPD - secondary to PNA imposed on acute COPD, he was initially on IV steroids which were changed to prednisone on 5/4, continued to improve, will be discharged home on a prednisone taper. May need to see pulmonary or get PFTs once this acute episode resolved.  Polysubstance abuse - alcohol and tobacco, THC, kept on CIWA protocol, discussed cessation and pt has verbalized understanding  HTN- has known diagnosis but not taking medications, added lisinopril for better  blood pressure control, blood pressure reasonably stable Transaminitis - alcohol induced, LFT's trending down  Severe PCM - with recent weight loss of unclear etiology, nutritionist consulted and assistance appreciated, continue to provide nutritional supplements   Procedures:  None     Consultations:  None   Discharge Exam: Filed Vitals:   04/12/15 2131 04/13/15 0526 04/13/15 0619 04/13/15 0849  BP:  133/80    Pulse:  90    Temp:  98.4 F (36.9 C)    TempSrc:  Oral    Resp:  18    Height:      Weight:   60.102 kg (132 lb 8 oz)   SpO2: 100% 100%  100%    General: NAD Cardiovascular: RRR Respiratory: no wheezing, overall decreased breath sounds  Discharge Instructions     Medication List    STOP taking these medications        hydrochlorothiazide 12.5 MG capsule  Commonly known as:  MICROZIDE      TAKE these medications        albuterol 108 (90 BASE) MCG/ACT inhaler  Commonly known as:  PROVENTIL HFA;VENTOLIN HFA  Inhale 2 puffs into the lungs every 6 (six) hours as needed for wheezing or shortness of breath.     amitriptyline 50 MG tablet  Commonly known as:  ELAVIL  Take 1 tablet (50 mg total) by mouth at bedtime.     guaiFENesin 100 MG/5ML Soln  Commonly known as:  ROBITUSSIN  Take 10 mLs (200 mg total) by mouth every 4 (four) hours as needed.     levofloxacin 750 MG tablet  Commonly known as:  LEVAQUIN  Take 1 tablet (750 mg total) by mouth daily.     lisinopril 20 MG tablet  Commonly known as:  PRINIVIL,ZESTRIL  Take 1 tablet (20 mg total) by mouth daily.     nicotine 14 mg/24hr patch  Commonly known as:  NICODERM CQ - dosed in mg/24 hours  Place 1 patch (14 mg total) onto the skin daily.     predniSONE 20 MG tablet  Commonly known as:  DELTASONE  Take 2 tablets (40 mg total) by mouth daily with breakfast. 2 tabs daily for 3 days then 1 tab daily for 3 days then 1/2 tab daily for 4 days than stop.     tiotropium 18 MCG inhalation capsule  Commonly known as:  SPIRIVA HANDIHALER  Place 1 capsule (18 mcg total) into inhaler and inhale daily.           Follow-up Information    Follow up with Chari Manning, NP. Schedule an appointment as soon as possible for a visit in 2 weeks.   Specialty:  Internal Medicine   Why:   Follow up appt after recent hospitalization   Contact information:   [place] [address] [phone]       The results of significant diagnostics from this hospitalization (including imaging, microbiology, ancillary and laboratory) are listed below for reference.    Significant Diagnostic Studies: Dg Chest 2 View  04/09/2015   CLINICAL DATA:  Progressive shortness of breath. Productive cough fever and chills. Followup pneumonia.  EXAM: CHEST  2 VIEW  COMPARISON:  04/07/2015  FINDINGS: Pulmonary hyperinflation again seen, consistent with COPD. Previously seen bilateral lower lobe infiltrates are decreased since previous study. No evidence of pleural effusion. No mass or lymphadenopathy identified. Heart size remains normal.  IMPRESSION: Resolving bilateral lower lobe infiltrates since previous study.  COPD.   Electronically  Signed   By: Earle Gell M.D.   On: 04/09/2015 11:40   Dg Chest 2 View  04/07/2015   CLINICAL DATA:  Cough, shortness of breath.  EXAM: CHEST  2 VIEW  COMPARISON:  December 15, 2012.  FINDINGS: The heart size and mediastinal contours are within normal limits. Both lungs are clear. No pneumothorax or pleural effusion is noted. Hyperexpansion of the lungs is noted suggesting chronic obstructive pulmonary disease. The visualized skeletal structures are unremarkable.  IMPRESSION: Findings concerning for chronic obstructive pulmonary disease. No acute cardiopulmonary abnormality seen.   Electronically Signed   By: Marijo Conception, M.D.   On: 04/07/2015 12:50   Ct Chest Wo Contrast  04/07/2015   CLINICAL DATA:  Sepsis, cough, fever.  History of hypertension.  EXAM: CT CHEST WITHOUT CONTRAST  TECHNIQUE: Multidetector CT imaging of the chest was performed following the standard protocol without IV contrast.  COMPARISON:  Chest x-ray 04/07/2015  FINDINGS: Heart: Heart size is normal. No significant coronary artery calcification. No pericardial effusion.  Vascular  structures: Normal noncontrast appearance of the aorta and pulmonary arteries.  Mediastinum/thyroid: The visualized portion of the thyroid gland has a normal appearance. No mediastinal, hilar, or axillary adenopathy.  Lungs/Airways: There is minimal emphysematous change scratched of there is minimal paraseptal emphysema. There patchy areas of interstitial infiltrate involving the right upper lobe an both lower lobes. Findings favor infectious process. There are no pleural effusions. No suspicious pulmonary nodules.  Upper abdomen: Unremarkable.  Chest wall/osseous structures: Negative.  IMPRESSION: 1. Patchy interstitial infiltrates most likely related to multi focal pneumonia. 2. No significant adenopathy.   Electronically Signed   By: Nolon Nations M.D.   On: 04/07/2015 22:51    Microbiology: Recent Results (from the past 240 hour(s))  Culture, Urine     Status: None   Collection Time: 04/07/15  6:04 PM  Result Value Ref Range Status   Specimen Description URINE, CLEAN CATCH  Final   Special Requests NONE  Final   Colony Count NO GROWTH Performed at [facility]   Final   Culture NO GROWTH Performed at [facility]   Final   Report Status 04/08/2015 FINAL  Final  MRSA PCR Screening     Status: None   Collection Time: 04/07/15  6:50 PM  Result Value Ref Range Status   MRSA by PCR NEGATIVE NEGATIVE Final    Comment:        The GeneXpert MRSA Assay (FDA approved for NASAL specimens only), is one component of a comprehensive MRSA colonization surveillance program. It is not intended to diagnose MRSA infection nor to guide or monitor treatment for MRSA infections.   Culture, expectorated sputum-assessment     Status: None   Collection Time: 04/07/15  7:15 PM  Result Value Ref Range Status   Specimen Description SPUTUM  Final   Special Requests NONE  Final   Sputum evaluation   Final    THIS SPECIMEN IS ACCEPTABLE. RESPIRATORY CULTURE REPORT TO FOLLOW.   Report  Status 04/07/2015 FINAL  Final  Culture, respiratory (NON-Expectorated)     Status: None   Collection Time: 04/07/15  7:15 PM  Result Value Ref Range Status   Specimen Description SPUTUM  Final   Special Requests NONE  Final   Gram Stain   Final    FEW WBC PRESENT, PREDOMINANTLY PMN RARE SQUAMOUS EPITHELIAL CELLS PRESENT MODERATE GRAM POSITIVE COCCI IN PAIRS AND CHAINS RARE GRAM POSITIVE RODS Performed at [facility]  Culture   Final    ABUNDANT STREPTOCOCCUS PNEUMONIAE Performed at [facility]    Report Status 04/10/2015 FINAL  Final   Organism ID, Bacteria STREPTOCOCCUS PNEUMONIAE  Final      Susceptibility   Streptococcus pneumoniae - MIC (ETEST)*    CEFTRIAXONE 0.25 SENSITIVE Sensitive     LEVOFLOXACIN 1.0 SENSITIVE Sensitive     PENICILLIN 0.38 INTERMEDIATE Intermediate     * ABUNDANT STREPTOCOCCUS PNEUMONIAE  Culture, blood (x 2)     Status: None (Preliminary result)   Collection Time: 04/07/15  7:42 PM  Result Value Ref Range Status   Specimen Description BLOOD LEFT ARM  Final   Special Requests AEB 4CC  Final   Culture   Final           BLOOD CULTURE RECEIVED NO GROWTH TO DATE CULTURE WILL BE HELD FOR 5 DAYS BEFORE ISSUING A FINAL NEGATIVE REPORT Performed at [facility]    Report Status PENDING  Incomplete  Culture, blood (x 2)     Status: None (Preliminary result)   Collection Time: 04/07/15  7:52 PM  Result Value Ref Range Status   Specimen Description BLOOD LEFT ARM  Final   Special Requests AEB 5CC  Final   Culture   Final           BLOOD CULTURE RECEIVED NO GROWTH TO DATE CULTURE WILL BE HELD FOR 5 DAYS BEFORE ISSUING A FINAL NEGATIVE REPORT Performed at [facility]    Report Status PENDING  Incomplete  Urine culture     Status: None   Collection Time: 04/08/15 10:01 PM  Result Value Ref Range Status   Specimen Description URINE, CLEAN CATCH  Final   Special Requests NONE  Final   Colony Count NO GROWTH Performed  at [facility]   Final   Culture NO GROWTH Performed at [facility]   Final   Report Status 04/09/2015 FINAL  Final     Labs: Basic Metabolic Panel:  Recent Labs Lab 04/07/15 1101 04/07/15 1426 04/07/15 1942 04/08/15 0342 04/09/15 0518 04/10/15 0431 04/11/15 0609 04/12/15 0423  NA 135 137  --  133* 133* 134* 135 137  K 4.4 4.1  --  3.4* 4.1 4.2 3.8 4.1  CL 104 106  --  104 107 104 106 105  CO2 21 19  --  21 21* 22 22 23   GLUCOSE 94 108*  --  109* 95 141* 106* 121*  BUN 5* 7  --  10 10 13 13 18   CREATININE 0.88 0.92  --  1.03 1.09 0.92 0.82 0.82  CALCIUM 9.6 9.0  --  8.7 8.5* 9.4 9.2 9.4  MG  --   --  1.9  --   --   --   --   --   PHOS  --   --  3.1  --   --   --   --   --    Liver Function Tests:  Recent Labs Lab 04/07/15 1426 04/08/15 0342 04/09/15 0518 04/10/15 0431 04/11/15 0609  AST 38* 26 23 22  36  ALT 18 16 14* 16* 26  ALKPHOS 67 65 52 51 43  BILITOT 0.7 2.2* 1.4* 0.7 0.5  PROT 8.7* 7.8 7.1 7.5 7.1  ALBUMIN 4.0 3.6 3.1* 3.4* 3.1*   No results for input(s): LIPASE, AMYLASE in the last 168 hours. No results for input(s): AMMONIA in the last 168 hours. CBC:  Recent Labs Lab 04/07/15 1426  04/09/15 0518 04/10/15 0431 04/11/15 0441 04/11/15 0609 04/12/15 0423  WBC 13.1*  < > 8.7 10.5 SPECIMEN CONTAMINATED, UNABLE TO PERFORM TEST(S). 15.1* 10.2  NEUTROABS 10.8*  --   --   --   --   --   --   HGB 6.4*  < > 8.0* 8.1* SPECIMEN CONTAMINATED, UNABLE TO PERFORM TEST(S). 7.9* 8.2*  HCT 23.1*  < > 26.8* 27.9* SPECIMEN CONTAMINATED, UNABLE TO PERFORM TEST(S). 27.0* 28.1*  MCV 62.4*  < > 68.4* 68.9* SPECIMEN CONTAMINATED, UNABLE TO PERFORM TEST(S). 69.8* 70.1*  PLT 290  < > 203 207 SPECIMEN CONTAMINATED, UNABLE TO PERFORM TEST(S). 165 156  < > = values in this interval not displayed.  SignedMarzetta Board  [facility] 04/13/2015, 10:42 AM

## 2015-04-13 NOTE — Discharge Instructions (Signed)
[facility] Stay Proper nutrition can help your body recover from illness and injury.   Foods and beverages high in protein, vitamins, and minerals help rebuild muscle loss, promote healing, & reduce fall risk.   In addition to eating healthy foods, a nutrition shake is an easy, delicious way to get the nutrition you need during and after your hospital stay  It is recommended that you continue to drink 2 bottles per day of:       Ensure Enlive for at least 1 month (30 days) after your hospital stay   Tips for adding a nutrition shake into your routine: As allowed, drink one with vitamins or medications instead of water or juice Enjoy one as a tasty mid-morning or afternoon snack Drink cold or make a milkshake out of it Drink one instead of milk with cereal or snacks Use as a coffee creamer   Available at the following grocery stores and pharmacies:           * [place] [phone]            For COUPONS visit: www.ensure.com/join or http://dawson-may.com/   Suggested Substitutions Ensure Plus = Boost Plus = Carnation Breakfast Essentials = Boost Compact Ensure Active Clear = Boost Breeze Glucerna Shake = Boost Glucose Control = Carnation Breakfast Essentials SUGAR FREE    Follow with Vincent Manning, NP in 5-7 days  Please get a complete blood count and chemistry panel checked by your Primary MD at your next visit, and again as instructed by your Primary MD. Please get your medications reviewed and adjusted by your Primary MD.  Please request your Primary MD to go over all Hospital Tests and Procedure/Radiological results at the follow up, please get all Hospital records sent to your Prim MD by signing hospital release before you go home.  If you had Pneumonia of Lung problems at the Hospital: Please  get a 2 view Chest X ray done in 6-8 weeks after hospital discharge or sooner if instructed by your Primary MD.  If you have Congestive Heart Failure: Please call your Cardiologist or Primary MD anytime you have any of the following symptoms:  1) 3 pound weight gain in 24 hours or 5 pounds in 1 week  2) shortness of breath, with or without a dry hacking cough  3) swelling in the hands, feet or stomach  4) if you have to sleep on extra pillows at night in order to breathe  Follow cardiac low salt diet and 1.5 lit/day fluid restriction.  If you have diabetes Accuchecks 4 times/day, Once in AM empty stomach and then before each meal. Log in all results and show them to your primary doctor at your next visit. If any glucose reading is under 80 or above 300 call your primary MD immediately.  If you have Seizure/Convulsions/Epilepsy: Please do not drive, operate heavy machinery, participate in activities at heights or participate in high speed sports until you have seen by Primary MD or a Neurologist and advised to do so again.  If you had Gastrointestinal Bleeding: Please ask your Primary MD to check a complete blood count within one week of discharge or at your next visit. Your  endoscopic/colonoscopic biopsies that are pending at the time of discharge, will also need to followed by your Primary MD.  Get Medicines reviewed and adjusted. Please take all your medications with you for your next visit with your Primary MD  Please request your Primary MD to go over all hospital tests and procedure/radiological results at the follow up, please ask your Primary MD to get all Hospital records sent to his/her office.  If you experience worsening of your admission symptoms, develop shortness of breath, life threatening emergency, suicidal or homicidal thoughts you must seek medical attention immediately by calling 911 or calling your MD immediately  if symptoms less severe.  You must read complete  instructions/literature along with all the possible adverse reactions/side effects for all the Medicines you take and that have been prescribed to you. Take any new Medicines after you have completely understood and accpet all the possible adverse reactions/side effects.   Do not drive or operate heavy machinery when taking Pain medications.   Do not take more than prescribed Pain, Sleep and Anxiety Medications  Special Instructions: If you have smoked or chewed Tobacco  in the last 2 yrs please stop smoking, stop any regular Alcohol  and or any Recreational drug use.  Wear Seat belts while driving.  Please note You were cared for by a hospitalist during your hospital stay. If you have any questions about your discharge medications or the care you received while you were in the hospital after you are discharged, you can call the unit and asked to speak with the hospitalist on call if the hospitalist that took care of you is not available. Once you are discharged, your primary care physician will handle any further medical issues. Please note that NO REFILLS for any discharge medications will be authorized once you are discharged, as it is imperative that you return to your primary care physician (or establish a relationship with a primary care physician if you do not have one) for your aftercare needs so that they can reassess your need for medications and monitor your lab values.  You can reach the hospitalist office at phone [phone] or fax [phone]   If you do not have a primary care physician, you can call [phone] for a physician referral.  Activity: As tolerated with Full fall precautions use walker/cane & assistance as needed  Diet: regular  Disposition Home

## 2015-04-14 LAB — CULTURE, BLOOD (ROUTINE X 2)
Culture: NO GROWTH
Culture: NO GROWTH

## 2015-04-28 ENCOUNTER — Encounter: Payer: Self-pay | Admitting: Internal Medicine

## 2015-04-28 ENCOUNTER — Ambulatory Visit (INDEPENDENT_AMBULATORY_CARE_PROVIDER_SITE_OTHER): Payer: Medicare HMO | Admitting: Internal Medicine

## 2015-04-28 ENCOUNTER — Telehealth: Payer: Self-pay | Admitting: Internal Medicine

## 2015-04-28 ENCOUNTER — Other Ambulatory Visit (INDEPENDENT_AMBULATORY_CARE_PROVIDER_SITE_OTHER): Payer: Medicare HMO

## 2015-04-28 VITALS — BP 124/58 | HR 104 | Temp 97.9°F | Resp 18 | Wt 143.4 lb

## 2015-04-28 DIAGNOSIS — R040 Epistaxis: Secondary | ICD-10-CM

## 2015-04-28 DIAGNOSIS — K625 Hemorrhage of anus and rectum: Secondary | ICD-10-CM

## 2015-04-28 DIAGNOSIS — D62 Acute posthemorrhagic anemia: Secondary | ICD-10-CM | POA: Diagnosis not present

## 2015-04-28 DIAGNOSIS — J189 Pneumonia, unspecified organism: Secondary | ICD-10-CM

## 2015-04-28 DIAGNOSIS — F17200 Nicotine dependence, unspecified, uncomplicated: Secondary | ICD-10-CM

## 2015-04-28 DIAGNOSIS — E43 Unspecified severe protein-calorie malnutrition: Secondary | ICD-10-CM | POA: Diagnosis not present

## 2015-04-28 DIAGNOSIS — Z720 Tobacco use: Secondary | ICD-10-CM

## 2015-04-28 LAB — COMPREHENSIVE METABOLIC PANEL
ALT: 28 U/L (ref 0–53)
AST: 17 U/L (ref 0–37)
Albumin: 3.6 g/dL (ref 3.5–5.2)
Alkaline Phosphatase: 53 U/L (ref 39–117)
BUN: 9 mg/dL (ref 6–23)
CALCIUM: 9.1 mg/dL (ref 8.4–10.5)
CO2: 28 meq/L (ref 19–32)
Chloride: 106 mEq/L (ref 96–112)
Creatinine, Ser: 1.01 mg/dL (ref 0.40–1.50)
GFR: 99.06 mL/min (ref >60.00)
GLUCOSE: 103 mg/dL — AB (ref 70–99)
Potassium: 4.3 mEq/L (ref 3.5–5.1)
SODIUM: 137 meq/L (ref 135–145)
TOTAL PROTEIN: 6.9 g/dL (ref 6.0–8.3)
Total Bilirubin: 0.2 mg/dL (ref 0.2–1.2)

## 2015-04-28 LAB — CBC
HCT: 28.8 % — ABNORMAL LOW (ref 39.0–52.0)
Hemoglobin: 8.9 g/dL — ABNORMAL LOW (ref 13.0–17.0)
MCHC: 30.8 g/dL (ref 30.0–36.0)
Platelets: 387 10*3/uL (ref 150.0–400.0)
RBC: 4.29 Mil/uL (ref 4.22–5.81)
RDW: 30.5 % — ABNORMAL HIGH (ref 11.5–15.5)
WBC: 9 10*3/uL (ref 4.0–10.5)

## 2015-04-28 MED ORDER — ENSURE COMPLETE PO LIQD: 237.0000 mL | ORAL | Status: DC

## 2015-04-28 MED ORDER — MELOXICAM 15 MG PO TABS: 15.0000 mg | ORAL_TABLET | Freq: Every day | ORAL | Status: DC

## 2015-04-28 NOTE — Patient Instructions (Signed)
We will check the blood work today and call you with the results.   You should hear back about the GI doctor and getting the next colon cancer screening.   Next week some day come back for the chest x-ray to make sure the pneumonia is better.   I have sent in the prescription for the ensure. Keep drinking 1 per day for the next 2 weeks and then just start taking a multivitamin every day.  Good job stopping smoking and keep up the good work. The lungs will start healing themselves.   Smoking Cessation Quitting smoking is important to your health and has many advantages. However, it is not always easy to quit since nicotine is a very addictive drug. Oftentimes, people try 3 times or more before being able to quit. This document explains the best ways for you to prepare to quit smoking. Quitting takes hard work and a lot of effort, but you can do it. ADVANTAGES OF QUITTING SMOKING  You will live longer, feel better, and live better.  Your body will feel the impact of quitting smoking almost immediately.  Within 20 minutes, blood pressure decreases. Your pulse returns to its normal level.  After 8 hours, carbon monoxide levels in the blood return to normal. Your oxygen level increases.  After 24 hours, the chance of having a heart attack starts to decrease. Your breath, hair, and body stop smelling like smoke.  After 48 hours, damaged nerve endings begin to recover. Your sense of taste and smell improve.  After 72 hours, the body is virtually free of nicotine. Your bronchial tubes relax and breathing becomes easier.  After 2 to 12 weeks, lungs can hold more air. Exercise becomes easier and circulation improves.  The risk of having a heart attack, stroke, cancer, or lung disease is greatly reduced.  After 1 year, the risk of coronary heart disease is cut in half.  After 5 years, the risk of stroke falls to the same as a nonsmoker.  After 10 years, the risk of lung cancer is cut in half  and the risk of other cancers decreases significantly.  After 15 years, the risk of coronary heart disease drops, usually to the level of a nonsmoker.  If you are pregnant, quitting smoking will improve your chances of having a healthy baby.  The people you live with, especially any children, will be healthier.  You will have extra money to spend on things other than cigarettes. QUESTIONS TO THINK ABOUT BEFORE ATTEMPTING TO QUIT You may want to talk about your answers with your health care provider.  Why do you want to quit?  If you tried to quit in the past, what helped and what did not?  What will be the most difficult situations for you after you quit? How will you plan to handle them?  Who can help you through the tough times? Your family? Friends? A health care provider?  What pleasures do you get from smoking? What ways can you still get pleasure if you quit? Here are some questions to ask your health care provider:  How can you help me to be successful at quitting?  What medicine do you think would be best for me and how should I take it?  What should I do if I need more help?  What is smoking withdrawal like? How can I get information on withdrawal? GET READY  Set a quit date.  Change your environment by getting rid of all cigarettes, ashtrays,  matches, and lighters in your home, car, or work. Do not let people smoke in your home.  Review your past attempts to quit. Think about what worked and what did not. GET SUPPORT AND ENCOURAGEMENT You have a better chance of being successful if you have help. You can get support in many ways.  Tell your family, friends, and coworkers that you are going to quit and need their support. Ask them not to smoke around you.  Get individual, group, or telephone counseling and support. Programs are available at [employer] and health centers. Call your local health department for information about programs in your area.  Spiritual  beliefs and practices may help some smokers quit.  Download a "quit meter" on your computer to keep track of quit statistics, such as how long you have gone without smoking, cigarettes not smoked, and money saved.  Get a self-help book about quitting smoking and staying off tobacco. [place] yourself from urges to smoke. Talk to someone, go for a walk, or occupy your time with a task.  Change your normal routine. Take a different route to work. Drink tea instead of coffee. Eat breakfast in a different place.  Reduce your stress. Take a hot bath, exercise, or read a book.  Plan something enjoyable to do every day. Reward yourself for not smoking.  Explore interactive web-based programs that specialize in helping you quit. GET MEDICINE AND USE IT CORRECTLY Medicines can help you stop smoking and decrease the urge to smoke. Combining medicine with the above behavioral methods and support can greatly increase your chances of successfully quitting smoking.  Nicotine replacement therapy helps deliver nicotine to your body without the negative effects and risks of smoking. Nicotine replacement therapy includes nicotine gum, lozenges, inhalers, nasal sprays, and skin patches. Some may be available over-the-counter and others require a prescription.  Antidepressant medicine helps people abstain from smoking, but how this works is unknown. This medicine is available by prescription.  Nicotinic receptor partial agonist medicine simulates the effect of nicotine in your brain. This medicine is available by prescription. Ask your health care provider for advice about which medicines to use and how to use them based on your health history. Your health care provider will tell you what side effects to look out for if you choose to be on a medicine or therapy. Carefully read the information on the package. Do not use any other product containing nicotine while using a nicotine  replacement product.  RELAPSE OR DIFFICULT SITUATIONS Most relapses occur within the first 3 months after quitting. Do not be discouraged if you start smoking again. Remember, most people try several times before finally quitting. You may have symptoms of withdrawal because your body is used to nicotine. You may crave cigarettes, be irritable, feel very hungry, cough often, get headaches, or have difficulty concentrating. The withdrawal symptoms are only temporary. They are strongest when you first quit, but they will go away within 10-14 days. To reduce the chances of relapse, try to:  Avoid drinking alcohol. Drinking lowers your chances of successfully quitting.  Reduce the amount of caffeine you consume. Once you quit smoking, the amount of caffeine in your body increases and can give you symptoms, such as a rapid heartbeat, sweating, and anxiety.  Avoid smokers because they can make you want to smoke.  Do not let weight gain distract you. Many smokers will gain weight when they quit, usually less than 10 pounds. Eat  a healthy diet and stay active. You can always lose the weight gained after you quit.  Find ways to improve your mood other than smoking. FOR MORE INFORMATION  www.smokefree.gov  Document Released: 11/19/2001 Document Revised: 04/11/2014 Document Reviewed: 03/05/2012 [facility] Patient Information 2015 [place], [place]. This information is not intended to replace advice given to you by your health care provider. Make sure you discuss any questions you have with your health care provider.

## 2015-04-28 NOTE — Assessment & Plan Note (Signed)
None in the last week and only 1 since leaving the hospital.

## 2015-04-28 NOTE — Telephone Encounter (Signed)
Patient called in for his lab results

## 2015-04-28 NOTE — Assessment & Plan Note (Signed)
Ordered CXR to follow up on resolution of CAP to be done next week. Meloxicam for pain in his chest wall muscles and advised that his cough will gradually fade and is normal to worsen after stopping smoking. Completed full course of antibiotics.

## 2015-04-28 NOTE — Assessment & Plan Note (Signed)
Only one nosebleed since leaving the hospital. Still needs colonoscopy to verify no GI source of the blood loss and weight loss. Recalls distant colonoscopy and cannot recall why he got then. Referral to GI. Recheck CBC to ensure no change in blood counts.

## 2015-04-28 NOTE — Progress Notes (Signed)
   Subjective:    Patient ID: Vincent [name], male    DOB: May 20, 1961, 54 y.o.   MRN: [id number]  HPI The patient is a 54 YO man who is coming in for hospital follow up (sent in for anemia and pneumonia, treated with transfusion and antibiotics). He is feeling much better and not having the dizziness and SOB that he was having. Only one small nosebleed since leaving the hospital (likely cause of the blood loss). Denies congestion or SOB. Some cough still which is hurting his chest. He has stopped smoking since the hospital with the patches. Drinking ensure and has gained weight since leaving the hospital. Some constipation new since leaving the hospital and has tried magnesium drink and this did not work well. Some bleeding from hemorrhoids on the toilet paper. Has had colonoscopy but long time ago (he said 90s he thinks).   Review of Systems  Constitutional: Positive for activity change and fatigue. Negative for appetite change.       Less than before, 60% better  HENT: Negative for dental problem, drooling, nosebleeds, postnasal drip, rhinorrhea, sinus pressure and sore throat.   Respiratory: Positive for cough. Negative for chest tightness and wheezing.   Cardiovascular: Negative for chest pain, palpitations and leg swelling.  Gastrointestinal: Positive for constipation. Negative for nausea, abdominal pain, diarrhea and abdominal distention.  Musculoskeletal: Negative.   Skin: Negative.   Neurological: Negative.       Objective:   Physical Exam  Constitutional: He is oriented to person, place, and time. He appears well-developed.  Skinny and mild distress  HENT:  Head: Normocephalic and atraumatic.  Eyes: EOM are normal.  Neck: Normal range of motion.  Cardiovascular: Regular rhythm.   Fast rate  Pulmonary/Chest: Effort normal.  improved  Abdominal: Soft.  Neurological: He is alert and oriented to person, place, and time. Coordination normal.  Skin: Skin is warm and dry. No rash  noted.   Filed Vitals:   04/28/15 0932  BP: 124/58  Pulse: 104  Temp: 97.9 F (36.6 C)  TempSrc: Oral  Resp: 18  Weight: 143 lb 6.4 oz (65.046 kg)  SpO2: 99%      Assessment & Plan:

## 2015-04-28 NOTE — Progress Notes (Signed)
Pre visit review using our clinic review tool, if applicable. No additional management support is needed unless otherwise documented below in the visit note. 

## 2015-04-28 NOTE — Assessment & Plan Note (Addendum)
He has quit since leaving the hospital and congratulated him on that success! Reminded him to stay away and reminded about the risks and harms of cigarette smoke.

## 2015-04-28 NOTE — Assessment & Plan Note (Signed)
Has gained 15 pounds since last visit with the ensure. Will continue 1 per day for next 2 weeks when multivitamin daily. Encouraged to continue with good food intake as he is doing.

## 2015-05-01 ENCOUNTER — Ambulatory Visit (INDEPENDENT_AMBULATORY_CARE_PROVIDER_SITE_OTHER)
Admission: RE | Admit: 2015-05-01 | Discharge: 2015-05-01 | Disposition: A | Discharge status: Home / Self Care | Payer: Medicare HMO | Source: Ambulatory Visit | Attending: Internal Medicine | Admitting: Internal Medicine

## 2015-05-01 DIAGNOSIS — J189 Pneumonia, unspecified organism: Secondary | ICD-10-CM | POA: Diagnosis not present

## 2015-05-12 ENCOUNTER — Encounter: Payer: Self-pay | Admitting: Internal Medicine

## 2015-07-07 ENCOUNTER — Encounter: Payer: Self-pay | Admitting: *Deleted

## 2015-07-13 ENCOUNTER — Other Ambulatory Visit (INDEPENDENT_AMBULATORY_CARE_PROVIDER_SITE_OTHER): Payer: Medicare HMO

## 2015-07-13 ENCOUNTER — Ambulatory Visit (INDEPENDENT_AMBULATORY_CARE_PROVIDER_SITE_OTHER): Payer: Medicare HMO | Admitting: Internal Medicine

## 2015-07-13 ENCOUNTER — Encounter: Payer: Self-pay | Admitting: Internal Medicine

## 2015-07-13 VITALS — BP 142/80 | HR 80 | Ht 66.0 in | Wt 143.0 lb

## 2015-07-13 DIAGNOSIS — D509 Iron deficiency anemia, unspecified: Secondary | ICD-10-CM

## 2015-07-13 DIAGNOSIS — Z8481 Family history of carrier of genetic disease: Secondary | ICD-10-CM

## 2015-07-13 DIAGNOSIS — F17201 Nicotine dependence, unspecified, in remission: Secondary | ICD-10-CM

## 2015-07-13 DIAGNOSIS — K625 Hemorrhage of anus and rectum: Secondary | ICD-10-CM | POA: Diagnosis not present

## 2015-07-13 DIAGNOSIS — Z832 Family history of diseases of the blood and blood-forming organs and certain disorders involving the immune mechanism: Secondary | ICD-10-CM

## 2015-07-13 DIAGNOSIS — R040 Epistaxis: Secondary | ICD-10-CM

## 2015-07-13 DIAGNOSIS — R634 Abnormal weight loss: Secondary | ICD-10-CM

## 2015-07-13 LAB — IBC PANEL
IRON: 28 ug/dL — AB (ref 42–165)
Saturation Ratios: 5.6 % — ABNORMAL LOW (ref 20.0–50.0)
TRANSFERRIN: 360 mg/dL (ref 212.0–360.0)

## 2015-07-13 LAB — APTT: aPTT: 29.7 s (ref 23.4–32.7)

## 2015-07-13 LAB — CBC WITH DIFFERENTIAL/PLATELET
BASOS PCT: 8 % — AB (ref 0.0–3.0)
Eosinophils Relative: 4 % (ref 0.0–5.0)
HEMATOCRIT: 35.4 % — AB (ref 39.0–52.0)
HEMOGLOBIN: 11.1 g/dL — AB (ref 13.0–17.0)
Lymphocytes Relative: 27 % (ref 12.0–46.0)
MCHC: 31.3 g/dL (ref 30.0–36.0)
MONOS PCT: 9 % (ref 3.0–12.0)
Neutrophils Relative %: 52 % (ref 43.0–77.0)
PLATELETS: 245 10*3/uL (ref 150.0–400.0)
RBC: 5.37 Mil/uL (ref 4.22–5.81)
RDW: 19.9 % — ABNORMAL HIGH (ref 11.5–15.5)
WBC: 4.9 10*3/uL (ref 4.0–10.5)

## 2015-07-13 LAB — COMPREHENSIVE METABOLIC PANEL
ALT: 16 U/L (ref 0–53)
AST: 23 U/L (ref 0–37)
Albumin: 4.2 g/dL (ref 3.5–5.2)
Alkaline Phosphatase: 69 U/L (ref 39–117)
BILIRUBIN TOTAL: 0.4 mg/dL (ref 0.2–1.2)
BUN: 8 mg/dL (ref 6–23)
CO2: 26 mEq/L (ref 19–32)
Calcium: 9.5 mg/dL (ref 8.4–10.5)
Chloride: 108 mEq/L (ref 96–112)
Creatinine, Ser: 1.06 mg/dL (ref 0.40–1.50)
GFR: 93.62 mL/min (ref >60.00)
Glucose, Bld: 76 mg/dL (ref 70–99)
Potassium: 4.2 mEq/L (ref 3.5–5.1)
SODIUM: 142 meq/L (ref 135–145)
Total Protein: 8 g/dL (ref 6.0–8.3)

## 2015-07-13 LAB — PROTIME-INR
INR: 1 ratio (ref 0.8–1.0)
PROTHROMBIN TIME: 11.6 s (ref 9.6–13.1)

## 2015-07-13 LAB — FERRITIN: FERRITIN: 6.7 ng/mL — AB (ref 22.0–322.0)

## 2015-07-13 MED ORDER — NA SULFATE-K SULFATE-MG SULF 17.5-3.13-1.6 GM/177ML PO SOLN: ORAL | Status: DC

## 2015-07-13 NOTE — Patient Instructions (Signed)
You have been scheduled for an endoscopy and colonoscopy. Please follow the written instructions given to you at your visit today. Please pick up your prep supplies at the pharmacy within the next 1-3 days. If you use inhalers (even only as needed), please bring them with you on the day of your procedure. Your physician has requested that you go to www.startemmi.com and enter the access code given to you at your visit today. This web site gives a general overview about your procedure. However, you should still follow specific instructions given to you by our office regarding your preparation for the procedure.  Your physician has requested that you go to the basement for the following lab work before leaving today: CBC, CMP, IBC, Ferritin, Hemoglobin electrophoresis, INR, PTT

## 2015-07-13 NOTE — Progress Notes (Signed)
Patient ID: Vincent [name], male   DOB: Mar 22, 1961, 54 y.o.   MRN: [id number] HPI:  Vincent [name] is a 54 year old male with history of microcytic anemia, malnutrition, COPD, hypertension who is seen in consultation at the request of Dr. Doug Sou to evaluate anemia and rectal bleeding. He's here today with his significant other. He reports that he's been having issues with rectal bleeding which is occurring most days with bowel movement. He notes this to be painless in nature. Blood can be bright red but also darker red and mixed with stool. He denies diarrhea or constipation. Denies abdominal pain. He has had issues with weight loss recently has been able to gain 15 pounds back with protein shakes, such as ensure. He's had issues with epistaxis most recently yesterday. He denies gingival bleeding or hematuria. Denies easy bruising. He denies nausea or vomiting. Reports appetite has been improving. Denies dysphagia or odynophagia. He was previously smoking tobacco but stopped recently after being told he has COPD. He recalls a very remote colonoscopy done greater than 10 years ago for unclear reason. No family history of GI tract malignancy or IBD.  He required blood transfusion recently with 2 units of packed red cells. This was in May 2016. Prior to this he was expressing fatigue which improved after transfusion.  His mother did have sickle cell disease.   Past Medical History  Diagnosis Date  . Glaucoma   . Hypertension   . Tobacco use disorder 04/10/2015  . Anemia   . Severe protein-calorie malnutrition   . CAP (community acquired pneumonia)   . COPD (chronic obstructive pulmonary disease)     Past Surgical History  Procedure Laterality Date  . Plate in neck    . Right shoulder arthroscopy      Outpatient Prescriptions Prior to Visit  Medication Sig Dispense Refill  . albuterol (PROVENTIL HFA;VENTOLIN HFA) 108 (90 BASE) MCG/ACT inhaler Inhale 2 puffs into the lungs every 6 (six) hours as  needed for wheezing or shortness of breath. 1 Inhaler 2  . amitriptyline (ELAVIL) 50 MG tablet Take 1 tablet (50 mg total) by mouth at bedtime. 30 tablet 3  . feeding supplement, ENSURE COMPLETE, (ENSURE COMPLETE) LIQD Take 237 mLs by mouth daily. 14 Bottle 0  . meloxicam (MOBIC) 15 MG tablet Take 1 tablet (15 mg total) by mouth daily. 30 tablet 0  . nicotine (NICODERM CQ - DOSED IN MG/24 HOURS) 14 mg/24hr patch Place 1 patch (14 mg total) onto the skin daily. 21 patch 0  . tiotropium (SPIRIVA HANDIHALER) 18 MCG inhalation capsule Place 1 capsule (18 mcg total) into inhaler and inhale daily. 30 capsule 12  . lisinopril (PRINIVIL,ZESTRIL) 20 MG tablet Take 1 tablet (20 mg total) by mouth daily. (Patient not taking: Reported on 04/07/2015) 90 tablet 2   No facility-administered medications prior to visit.    No Known Allergies  Family History  Problem Relation Age of Onset  . Heart disease Father   . Sickle cell anemia Mother     deceased  . Diabetes Sister     History  Substance Use Topics  . Smoking status: Former Smoker -- 0.50 packs/day for 40 years    Types: Cigarettes    Quit date: 03/10/2015  . Smokeless tobacco: Never Used  . Alcohol Use: No     Comment: occasionally     ROS: As per history of present illness, otherwise negative  BP 142/80 mmHg  Pulse 80  Ht 5\' 6"  (1.676 m)  Wt 143  lb (64.864 kg)  BMI 23.09 kg/m2 Constitutional: Well-developed and well-nourished. No distress. HEENT: Normocephalic and atraumatic. Oropharynx is clear and moist. No oropharyngeal exudate. Conjunctivae are normal.  No scleral icterus. Neck: Neck supple. Trachea midline. Cardiovascular: Normal rate, regular rhythm and intact distal pulses. No M/R/G Pulmonary/chest: Effort normal and breath sounds normal. No wheezing, rales or rhonchi. Abdominal: Soft, nontender, nondistended. Bowel sounds active throughout.  Extremities: no clubbing, cyanosis, or edema Lymphadenopathy: No cervical  adenopathy noted. Neurological: Alert and oriented to person place and time. Skin: Skin is warm and dry. No rashes noted. Psychiatric: Normal mood and affect. Behavior is normal.  RELEVANT LABS AND IMAGING: CBC    Component Value Date/Time   WBC 9.0 04/28/2015 1145   RBC 4.29 04/28/2015 1145   RBC 3.89* 04/07/2015 1942   HGB 8.9 Repeated and verified X2.* 04/28/2015 1145   HCT 28.8* 04/28/2015 1145   PLT 387.0 04/28/2015 1145   MCV 67.2 Repeated and verified X2.* 04/28/2015 1145   MCH 20.4* 04/12/2015 0423   MCHC 30.8 04/28/2015 1145   RDW 30.5* 04/28/2015 1145   LYMPHSABS 1.2 04/07/2015 1426   MONOABS 1.0 04/07/2015 1426   EOSABS 0.0 04/07/2015 1426   BASOSABS 0.1 04/07/2015 1426    CMP     Component Value Date/Time   NA 137 04/28/2015 1145   K 4.3 04/28/2015 1145   CL 106 04/28/2015 1145   CO2 28 04/28/2015 1145   GLUCOSE 103* 04/28/2015 1145   BUN 9 04/28/2015 1145   CREATININE 1.01 04/28/2015 1145   CREATININE 1.12 07/05/2013 1113   CALCIUM 9.1 04/28/2015 1145   PROT 6.9 04/28/2015 1145   ALBUMIN 3.6 04/28/2015 1145   AST 17 04/28/2015 1145   ALT 28 04/28/2015 1145   ALKPHOS 53 04/28/2015 1145   BILITOT 0.2 04/28/2015 1145   GFRNONAA >60 04/12/2015 0423   GFRNONAA 75 07/05/2013 1113   GFRAA >60 04/12/2015 0423   GFRAA 87 07/05/2013 1113    ASSESSMENT/PLAN:  54 year old male with history of microcytic anemia, malnutrition, COPD, hypertension who is seen in consultation at the request of Dr. Doug Sou to evaluate anemia and rectal bleeding.  1. Rectal bleeding -- colonoscopy recommended to evaluate rectal bleeding also severe microcytic anemia. We discussed the risks, benefits and alternatives and he is agreeable to proceed.  2. Microcytic anemia -- profound and requiring transfusion. Check iron studies today. May need iron replacement. Check hemoglobinopathy panel to exclude sickle cell. Upper endoscopy recommended along with colonoscopy to evaluate malnutrition,  recent weight loss and also microcytic anemia. Procedure discussed including the risks and benefits and he is agreeable to proceed  3. Tobacco abuse -- now in remission using nicotine patch with success  4. Bleeding, nose and rectal -- raises the question of bleeding diathesis. Check coagulation profile today. May need hematology referral  CBC, CMP, IBC, ferritin, hemoglobin electrophoresis and coags    KD:[id number] A Ina, [place], [place] [id number]

## 2015-07-14 ENCOUNTER — Other Ambulatory Visit: Payer: Self-pay

## 2015-07-14 DIAGNOSIS — D509 Iron deficiency anemia, unspecified: Secondary | ICD-10-CM

## 2015-07-17 LAB — HEMOGLOBINOPATHY EVALUATION
Hemoglobin Other: 0 %
Hgb A2 Quant: 4.6 % — ABNORMAL HIGH (ref 2.2–3.2)
Hgb A: 94.9 % — ABNORMAL LOW (ref 96.8–97.8)
Hgb F Quant: 0.5 % (ref 0.0–2.0)
Hgb S Quant: 0 %

## 2015-07-19 ENCOUNTER — Telehealth: Payer: Self-pay | Admitting: Oncology

## 2015-07-19 ENCOUNTER — Other Ambulatory Visit: Payer: Self-pay

## 2015-07-19 DIAGNOSIS — D509 Iron deficiency anemia, unspecified: Secondary | ICD-10-CM

## 2015-07-19 NOTE — Telephone Encounter (Signed)
new patient appt-s/w patient and gave np appt for 08/24 @ 11 w/Dr. Julien Nordmann Referring Dr. Zenovia Jarred Dx-Microcytic Anemia

## 2015-07-21 ENCOUNTER — Telehealth: Payer: Self-pay | Admitting: Internal Medicine

## 2015-07-21 NOTE — Telephone Encounter (Signed)
Patient states he left forms to be completed.  I do not see an FYI in system.  Patient states he needs help around the house with his care.  He is requesting these forms to be completed as soon as possible because his wife was at home with him but she is going back to work.  Do either of you have these forms?  Please call patient back in regards.

## 2015-07-24 NOTE — Telephone Encounter (Signed)
An FYI was placed. I called and left vm that an appt is needed.

## 2015-08-02 ENCOUNTER — Encounter: Payer: Self-pay | Admitting: Internal Medicine

## 2015-08-02 ENCOUNTER — Other Ambulatory Visit: Payer: Medicare HMO

## 2015-08-02 ENCOUNTER — Other Ambulatory Visit: Payer: Self-pay | Admitting: Internal Medicine

## 2015-08-02 ENCOUNTER — Ambulatory Visit (HOSPITAL_BASED_OUTPATIENT_CLINIC_OR_DEPARTMENT_OTHER): Payer: Medicare HMO

## 2015-08-02 ENCOUNTER — Ambulatory Visit (HOSPITAL_BASED_OUTPATIENT_CLINIC_OR_DEPARTMENT_OTHER): Payer: Medicare HMO | Admitting: Internal Medicine

## 2015-08-02 ENCOUNTER — Ambulatory Visit: Payer: Medicare HMO

## 2015-08-02 ENCOUNTER — Telehealth: Payer: Self-pay | Admitting: Internal Medicine

## 2015-08-02 VITALS — BP 157/90 | HR 86 | Temp 98.9°F | Resp 17 | Ht 66.0 in | Wt 143.7 lb

## 2015-08-02 DIAGNOSIS — D500 Iron deficiency anemia secondary to blood loss (chronic): Secondary | ICD-10-CM

## 2015-08-02 DIAGNOSIS — D563 Thalassemia minor: Secondary | ICD-10-CM

## 2015-08-02 DIAGNOSIS — D539 Nutritional anemia, unspecified: Secondary | ICD-10-CM

## 2015-08-02 DIAGNOSIS — K922 Gastrointestinal hemorrhage, unspecified: Secondary | ICD-10-CM | POA: Diagnosis not present

## 2015-08-02 HISTORY — DX: Thalassemia minor: D56.3

## 2015-08-02 HISTORY — DX: Iron deficiency anemia secondary to blood loss (chronic): D50.0

## 2015-08-02 LAB — CBC & DIFF AND RETIC
BASO%: 1.2 % (ref 0.0–2.0)
Basophils Absolute: 0.1 10*3/uL (ref 0.0–0.1)
EOS%: 1.5 % (ref 0.0–7.0)
Eosinophils Absolute: 0.1 10*3/uL (ref 0.0–0.5)
HCT: 38.8 % (ref 38.4–49.9)
HGB: 12 g/dL — ABNORMAL LOW (ref 13.0–17.1)
Immature Retic Fract: 31.5 % — ABNORMAL HIGH (ref 3.00–10.60)
LYMPH#: 1.5 10*3/uL (ref 0.9–3.3)
LYMPH%: 22.6 % (ref 14.0–49.0)
MCH: 21.4 pg — ABNORMAL LOW (ref 27.2–33.4)
MCHC: 30.9 g/dL — AB (ref 32.0–36.0)
MCV: 69.2 fL — AB (ref 79.3–98.0)
MONO#: 0.9 10*3/uL (ref 0.1–0.9)
MONO%: 13.7 % (ref 0.0–14.0)
NEUT%: 61 % (ref 39.0–75.0)
NEUTROS ABS: 4 10*3/uL (ref 1.5–6.5)
PLATELETS: 160 10*3/uL (ref 140–400)
RBC: 5.61 10*6/uL (ref 4.20–5.82)
RDW: 20.6 % — ABNORMAL HIGH (ref 11.0–14.6)
RETIC CT ABS: 98.74 10*3/uL — AB (ref 34.80–93.90)
Retic %: 1.76 % (ref 0.80–1.80)
WBC: 6.6 10*3/uL (ref 4.0–10.3)

## 2015-08-02 LAB — IRON AND TIBC CHCC
%SAT: 8 % — ABNORMAL LOW (ref 20–55)
Iron: 34 ug/dL — ABNORMAL LOW (ref 42–163)
TIBC: 419 ug/dL — ABNORMAL HIGH (ref 202–409)
UIBC: 385 ug/dL — ABNORMAL HIGH (ref 117–376)

## 2015-08-02 LAB — COMPREHENSIVE METABOLIC PANEL (CC13)
ALBUMIN: 4.1 g/dL (ref 3.5–5.0)
ALK PHOS: 77 U/L (ref 40–150)
ALT: 14 U/L (ref 0–55)
AST: 17 U/L (ref 5–34)
Anion Gap: 9 mEq/L (ref 3–11)
BILIRUBIN TOTAL: 0.52 mg/dL (ref 0.20–1.20)
BUN: 7.5 mg/dL (ref 7.0–26.0)
CALCIUM: 9.7 mg/dL (ref 8.4–10.4)
CO2: 23 mEq/L (ref 22–29)
CREATININE: 1.2 mg/dL (ref 0.7–1.3)
Chloride: 107 mEq/L (ref 98–109)
EGFR: 81 mL/min/{1.73_m2} — ABNORMAL LOW (ref >90)
Glucose: 88 mg/dl (ref 70–140)
Potassium: 4.6 mEq/L (ref 3.5–5.1)
Sodium: 139 mEq/L (ref 136–145)
Total Protein: 7.9 g/dL (ref 6.4–8.3)

## 2015-08-02 LAB — LACTATE DEHYDROGENASE (CC13): LDH: 139 U/L (ref 125–245)

## 2015-08-02 LAB — FERRITIN CHCC: Ferritin: 34 ng/ml (ref 22–316)

## 2015-08-02 NOTE — Progress Notes (Signed)
[facility] [phone]   [phone]  CONSULT NOTE  REFERRING PHYSICIAN: Dr. Ulice Dash Pyrtle  REASON FOR CONSULTATION:  54 years old African-American male with persistent iron deficiency anemia  HPI Vincent [name] is a 54 y.o. male was past medical history significant for hypertension, COPD, pneumonia, malnutrition as well as thalassemia minor and family history of sickle cell disease. The patient was seen by his primary care physician complaining of rectal bleeding that was going on and off for several weeks. It was not hemorrhoidal bleed. He was referred to Dr. Hilarie Fredrickson for evaluation and he is considered for repeat colonoscopy in October 2016. Blood work performed recently showed persistent iron deficiency anemia. He was referred to me today for evaluation and consideration of intravenous iron infusion. He was started by Dr. Hilarie Fredrickson a week ago on ferrous sulfate 325 mg by mouth daily. He continues to complain of fatigue and weakness as well as occasional dizzy spells. He was recently discharged from the hospital after treatment for pneumonia and COPD. He denied having any other bleeding issues except for occasional minor nosebleed. His last colonoscopy was in 2005 and the patient had a few polyps removed at that time. His family history is consistent for a mother with sickle cell disease. The patient had hemoglobin electrophoresis that showed elevated hemoglobin A2 to 4.6% consistent with thalassemia minor.  His last ferritin was 6.7, serum iron was 28 and iron saturation was 5.6%. The patient is here today for evaluation and management of his condition. He has occasional chest pain with mild cough with no hemoptysis. He lost around 20 pounds over the last 6 months. He has mild nausea with no vomiting and also has occasional headache especially in the morning. Family history significant for father with heart disease, mother with sickle cell disease and sister with  diabetes mellitus. The patient is married and has 2 children. He was accompanied by his wife, Vincent [name]. He is currently on disability for broken cervical spines. He has a history of smoking half pack per day for around 40 years quit in April 2016. He drinks alcohol occasionally and no history of drug abuse. HPI  Past Medical History  Diagnosis Date  . Glaucoma   . Hypertension   . Tobacco use disorder 04/10/2015  . Anemia   . Severe protein-calorie malnutrition   . CAP (community acquired pneumonia)   . COPD (chronic obstructive pulmonary disease)     Past Surgical History  Procedure Laterality Date  . Plate in neck    . Right shoulder arthroscopy      Family History  Problem Relation Age of Onset  . Heart disease Father   . Sickle cell anemia Mother     deceased  . Diabetes Sister     Social History Social History  Substance Use Topics  . Smoking status: Former Smoker -- 0.50 packs/day for 40 years    Types: Cigarettes    Quit date: 03/10/2015  . Smokeless tobacco: Never Used  . Alcohol Use: No     Comment: occasionally     No Known Allergies  Current Outpatient Prescriptions  Medication Sig Dispense Refill  . albuterol (PROVENTIL HFA;VENTOLIN HFA) 108 (90 BASE) MCG/ACT inhaler Inhale 2 puffs into the lungs every 6 (six) hours as needed for wheezing or shortness of breath. 1 Inhaler 2  . feeding supplement, ENSURE COMPLETE, (ENSURE COMPLETE) LIQD Take 237 mLs by mouth daily. 14 Bottle 0  . ferrous sulfate 325 (65 FE) MG  tablet Take 325 mg by mouth daily with breakfast.    . ibuprofen (ADVIL,MOTRIN) 200 MG tablet Take 200 mg by mouth every 6 (six) hours as needed.    . nicotine (NICODERM CQ - DOSED IN MG/24 HOURS) 14 mg/24hr patch Place 1 patch (14 mg total) onto the skin daily. 21 patch 0  . tiotropium (SPIRIVA HANDIHALER) 18 MCG inhalation capsule Place 1 capsule (18 mcg total) into inhaler and inhale daily. 30 capsule 12  . meloxicam (MOBIC) 15 MG tablet Take 1 tablet  (15 mg total) by mouth daily. (Patient not taking: Reported on 08/02/2015) 30 tablet 0  . Na Sulfate-K Sulfate-Mg Sulf SOLN Suprep-Use as directed 354 mL 0   No current facility-administered medications for this visit.    Review of Systems  Constitutional: positive for fatigue and weight loss Eyes: negative Ears, nose, mouth, throat, and face: negative Respiratory: positive for cough and pleurisy/chest pain Cardiovascular: negative Gastrointestinal: negative Genitourinary:negative Integument/breast: negative Hematologic/lymphatic: negative Musculoskeletal:negative Neurological: positive for dizziness Behavioral/Psych: negative Endocrine: negative Allergic/Immunologic: negative  Physical Exam  NLG:XQJJH, healthy, no distress, well nourished and well developed SKIN: skin color, texture, turgor are normal, no rashes or significant lesions HEAD: Normocephalic, No masses, lesions, tenderness or abnormalities EYES: normal, PERRLA, Conjunctiva are pink and non-injected EARS: External ears normal, Canals clear OROPHARYNX:no exudate, no erythema and lips, buccal mucosa, and tongue normal  NECK: supple, no adenopathy, no JVD LYMPH:  no palpable lymphadenopathy, no hepatosplenomegaly LUNGS: clear to auscultation , and palpation HEART: regular rate & rhythm, no murmurs and no gallops ABDOMEN:abdomen soft, non-tender, normal bowel sounds and no masses or organomegaly BACK: Back symmetric, no curvature., No CVA tenderness EXTREMITIES:no joint deformities, effusion, or inflammation, no edema, no skin discoloration  NEURO: alert & oriented x 3 with fluent speech, no focal motor/sensory deficits  PERFORMANCE STATUS: ECOG 1  LABORATORY DATA: Lab Results  Component Value Date   WBC 6.6 08/02/2015   HGB 12.0* 08/02/2015   HCT 38.8 08/02/2015   MCV 69.2* 08/02/2015   PLT 160 08/02/2015      Chemistry      Component Value Date/Time   NA 139 08/02/2015 1227   NA 142 07/13/2015 1018    K 4.6 08/02/2015 1227   K 4.2 07/13/2015 1018   CL 108 07/13/2015 1018   CO2 23 08/02/2015 1227   CO2 26 07/13/2015 1018   BUN 7.5 08/02/2015 1227   BUN 8 07/13/2015 1018   CREATININE 1.2 08/02/2015 1227   CREATININE 1.06 07/13/2015 1018   CREATININE 1.12 07/05/2013 1113      Component Value Date/Time   CALCIUM 9.5 07/13/2015 1018   ALKPHOS 69 07/13/2015 1018   AST 23 07/13/2015 1018   ALT 16 07/13/2015 1018   BILITOT 0.4 07/13/2015 1018       RADIOGRAPHIC STUDIES: No results found.  ASSESSMENT: This is a very pleasant 54 years old African-American male with persistent iron deficiency anemia secondary to gastrointestinal blood loss in addition to thalassemia minor.   PLAN: I had a lengthy discussion with the patient and his wife today about his current condition and treatment options. I strongly recommended for the patient to proceed with the colonoscopy by Dr. Hilarie Fredrickson but he may need to schedule this procedure little bit earlier than October because of his persistent bleeding. I also several studies today for evaluation of his anemia including repeat CBC, comprehensive metabolic panel, LDH, iron study, ferritin, serum folate, serum vitamin B 12 level, serum protein electrophoresis, serum erythropoietin as  well as transferrin receptor and blood heavy metals.  I also discussed with the patient proceeding with Feraheme infusion 510 mg IV weekly 2 doses. First dose expected 08/09/2015. He will also continue with the oral iron tablets for now. He will come back for follow-up visit in 2 months for reevaluation after repeating CBC and iron study. The patient was advised to call immediately if he has any concerning symptoms in the interval. The patient voices understanding of current disease status and treatment options and is in agreement with the current care plan.  All questions were answered. The patient knows to call the clinic with any problems, questions or concerns. We can  certainly see the patient much sooner if necessary.  Thank you so much for allowing me to participate in the care of Inetta Fermo. I will continue to follow up the patient with you and assist in his care.  I spent 40 minutes counseling the patient face to face. The total time spent in the appointment was 60 minutes.  Disclaimer: This note was dictated with voice recognition software. Similar sounding words can inadvertently be transcribed and may not be corrected upon review.   Mackey Varricchio K. August 02, 2015, 12:07 PM

## 2015-08-02 NOTE — Progress Notes (Signed)
Checked in pt for blood concern. Pt has my card if needed for financial assistance or concerns. Pt has 2 insurances.

## 2015-08-02 NOTE — Telephone Encounter (Signed)
Gave and printed appt sched and avs for pt for Aug SEpt adn OCT

## 2015-08-03 ENCOUNTER — Encounter: Payer: Self-pay | Admitting: Internal Medicine

## 2015-08-03 ENCOUNTER — Ambulatory Visit (INDEPENDENT_AMBULATORY_CARE_PROVIDER_SITE_OTHER): Payer: Medicare HMO | Admitting: Internal Medicine

## 2015-08-03 VITALS — BP 120/76 | HR 97 | Temp 98.7°F | Resp 18 | Ht 67.0 in | Wt 142.8 lb

## 2015-08-03 DIAGNOSIS — E43 Unspecified severe protein-calorie malnutrition: Secondary | ICD-10-CM | POA: Diagnosis not present

## 2015-08-03 DIAGNOSIS — D509 Iron deficiency anemia, unspecified: Secondary | ICD-10-CM

## 2015-08-03 DIAGNOSIS — R040 Epistaxis: Secondary | ICD-10-CM

## 2015-08-03 DIAGNOSIS — Z720 Tobacco use: Secondary | ICD-10-CM

## 2015-08-03 DIAGNOSIS — M1990 Unspecified osteoarthritis, unspecified site: Secondary | ICD-10-CM

## 2015-08-03 DIAGNOSIS — D539 Nutritional anemia, unspecified: Secondary | ICD-10-CM | POA: Diagnosis not present

## 2015-08-03 DIAGNOSIS — F17200 Nicotine dependence, unspecified, uncomplicated: Secondary | ICD-10-CM

## 2015-08-03 MED ORDER — CELECOXIB 200 MG PO CAPS: 200.0000 mg | ORAL_CAPSULE | Freq: Two times a day (BID) | ORAL | Status: DC

## 2015-08-03 NOTE — Assessment & Plan Note (Signed)
Has not tried anything for the arthritis. Will try celebrex for the pain to see if he can become more mobile. History not consistent with RA. Advised not to take other NSAIDS with the celebrex but can try tylenol (he has not tried). Ordered Pt/Ot to help him be more active. He is able to do all ADLs.

## 2015-08-03 NOTE — Assessment & Plan Note (Signed)
Congratulated him on continued cessation and encouraged him to continue with staying away from smoking.

## 2015-08-03 NOTE — Progress Notes (Signed)
Pre visit review using our clinic review tool, if applicable. No additional management support is needed unless otherwise documented below in the visit note. 

## 2015-08-03 NOTE — Assessment & Plan Note (Signed)
Has only had 1 episode in the last month and CBC stable with Hg 12 yesterday.

## 2015-08-03 NOTE — Assessment & Plan Note (Signed)
This problem is resolved and most recent albumin 4.1 off all supplements.

## 2015-08-03 NOTE — Patient Instructions (Signed)
We are sending in celebrex for the arthritis. This is a medicine you take twice a day with food to help with the arthritis.  We are also getting home health to come to the home and do physical therapy and occupational therapy to help you do better at home. If they think you need more help we will fill out the PCS form but I don't think you would qualify right now since you have not done any physical therapy. You should be able to work with physical therapy and get stronger and be able to do most of the things you used to do.   Your nutrition level is back to normal and your weight is good. Start trying to do some walking every day for about 15 minutes and gradually build up your endurance to 30 minutes a day.  Keep up the good work with not smoking.

## 2015-08-03 NOTE — Progress Notes (Signed)
   Subjective:    Patient ID: Vincent [name], male    DOB: [birth year], 54 y.o.   MRN: [id number]  HPI The patient is a 54 YO man coming in requesting personal care services. He has been struggling with iron deficiency anemia and is scheduled for colonoscopy to determine if there is a cause. Also had been struggling with nosebleeds but only 1 in the last 3 months. Off nutritional supplements and eating well. Not getting around well due to arthritis. Has not tried anything for the arthritis except ibuprofen over the counter which only helps a little. This keeps him from being active. He was walking with his family and stopping to rest but has not been doing that lately. Able to do all ADLs by himself but is worried about being home alone since his spouse is going back to work soon. Has not done any physical therapy. Denies morning stiffness, more pain in his feet and legs. Also pain in his fingers due to prior neck injury.   Review of Systems  Constitutional: Positive for activity change and fatigue. Negative for appetite change.       Able to do ADLs  HENT: Negative for dental problem, drooling, nosebleeds, postnasal drip, rhinorrhea, sinus pressure and sore throat.   Respiratory: Positive for shortness of breath. Negative for cough, chest tightness and wheezing.        With exertion  Cardiovascular: Negative for chest pain, palpitations and leg swelling.  Gastrointestinal: Negative for nausea, abdominal pain, diarrhea, constipation and abdominal distention.  Musculoskeletal: Positive for arthralgias and neck pain. Negative for back pain.      Objective:   Physical Exam  Constitutional: He is oriented to person, place, and time. He appears well-developed and well-nourished. No distress.  HENT:  Head: Normocephalic and atraumatic.  Eyes: EOM are normal.  Neck: Normal range of motion.  Cardiovascular: Normal rate and regular rhythm.   Pulmonary/Chest: Effort normal and breath sounds normal.    Abdominal: Soft.  Neurological: He is alert and oriented to person, place, and time. Coordination normal.  Skin: Skin is warm and dry. No rash noted.   Filed Vitals:   08/03/15 0928  BP: 120/76  Pulse: 97  Temp: 98.7 F (37.1 C)  TempSrc: Oral  Resp: 18  Height: 5\' 7"  (1.702 m)  Weight: 142 lb 12.8 oz (64.774 kg)  SpO2: 99%      Assessment & Plan:

## 2015-08-03 NOTE — Assessment & Plan Note (Signed)
Hematology is planning for IV iron, blood counts steadily improving and nutrition level is good. Likely his colonoscopy can wait until October. Ordering PT and OT to help him improve in his home. Doubt that he truly needs PCS services at this time.

## 2015-08-04 ENCOUNTER — Telehealth: Payer: Self-pay | Admitting: Internal Medicine

## 2015-08-04 DIAGNOSIS — M549 Dorsalgia, unspecified: Secondary | ICD-10-CM

## 2015-08-04 NOTE — Telephone Encounter (Signed)
Carrie from [facility] is requesting verbal orders for physical therapy for 1 x for 1 week, then 2 x for 2 wks. She would also like to talk with you about pts pain in joints, specially in the toes and hands and maybe get a referral to a rheumatologist. He's having a hard time getting around and would like to put an order in for a 4 wheel walker and fax to [facility]. She's also thinking it would be a good idea for a personal aid services since wife will be going back to work.  Can you please call her at [phone]

## 2015-08-06 LAB — HEAVY METALS, BLOOD
Arsenic: <3 mcg/L (ref <23)
Lead: 3 ug/dL (ref <10)

## 2015-08-06 LAB — SPEP & IFE WITH QIG
ABNORMAL PROTEIN BAND1: 0.5 g/dL
ALBUMIN ELP: 4.1 g/dL (ref 3.8–4.8)
ALPHA-1-GLOBULIN: 0.3 g/dL (ref 0.2–0.3)
ALPHA-2-GLOBULIN: 0.7 g/dL (ref 0.5–0.9)
BETA 2: 0.3 g/dL (ref 0.2–0.5)
Beta Globulin: 0.5 g/dL (ref 0.4–0.6)
GAMMA GLOBULIN: 1.9 g/dL — AB (ref 0.8–1.7)
IgA: 241 mg/dL (ref 68–379)
IgG (Immunoglobin G), Serum: 1860 mg/dL — ABNORMAL HIGH (ref 650–1600)
IgM, Serum: 114 mg/dL (ref 41–251)
Total Protein, Serum Electrophoresis: 7.8 g/dL (ref 6.1–8.1)

## 2015-08-06 LAB — FOLATE: FOLATE: 9.6 ng/mL

## 2015-08-06 LAB — HAPTOGLOBIN: HAPTOGLOBIN: 160 mg/dL (ref 43–212)

## 2015-08-06 LAB — ERYTHROPOIETIN: Erythropoietin: 27.3 m[IU]/mL — ABNORMAL HIGH (ref 2.6–18.5)

## 2015-08-06 LAB — VITAMIN B12: VITAMIN B 12: 401 pg/mL (ref 211–911)

## 2015-08-07 NOTE — Telephone Encounter (Signed)
Carrie from home health wants an order for a 4 wheel walker and also wants to know if you have thought about sending the patient to a rheumatologist. She thinks his pain could be gout or arthritis. She said we could fax the order to [facility] if you approve it. I already gave her the verbal orders for PT.

## 2015-08-07 NOTE — Addendum Note (Signed)
Addended by: Vertell Novak A on: 08/07/2015 04:17 PM   Modules accepted: Orders

## 2015-08-07 NOTE — Telephone Encounter (Signed)
Rx printed. Will not order PCS services until he try treatment to get better.

## 2015-08-07 NOTE — Telephone Encounter (Signed)
Fine for therapy and will print rx for 4 wheeled walked (find out if he wants seat). Has he tried the celebrex we just prescribed for the pain in his joints? Likely would need to try that before rheumatology is reasonable and we can check him for gout or other forms of arthritis.

## 2015-08-07 NOTE — Telephone Encounter (Signed)
Patient would like the walker with the seat. Also, he has not tried the celebrex. I told him to try that first, before we send him to rheumatology. Also, I explained that we can check him during his next visit for arthritis and gout.

## 2015-08-09 ENCOUNTER — Telehealth: Payer: Self-pay | Admitting: Internal Medicine

## 2015-08-09 ENCOUNTER — Ambulatory Visit (HOSPITAL_BASED_OUTPATIENT_CLINIC_OR_DEPARTMENT_OTHER): Payer: Medicare HMO

## 2015-08-09 VITALS — BP 136/78 | HR 85 | Temp 98.4°F | Resp 18

## 2015-08-09 DIAGNOSIS — D500 Iron deficiency anemia secondary to blood loss (chronic): Secondary | ICD-10-CM

## 2015-08-09 DIAGNOSIS — D539 Nutritional anemia, unspecified: Secondary | ICD-10-CM

## 2015-08-09 DIAGNOSIS — K922 Gastrointestinal hemorrhage, unspecified: Secondary | ICD-10-CM

## 2015-08-09 MED ORDER — SODIUM CHLORIDE 0.9 % IV SOLN
Freq: Once | INTRAVENOUS | Status: AC
  Administered 2015-08-09: 09:00:00 via INTRAVENOUS

## 2015-08-09 MED ORDER — SODIUM CHLORIDE 0.9 % IV SOLN
510.0000 mg | Freq: Once | INTRAVENOUS | Status: AC
  Administered 2015-08-09: 510 mg via INTRAVENOUS
  Filled 2015-08-09: qty 17

## 2015-08-09 NOTE — Patient Instructions (Signed)

## 2015-08-09 NOTE — Telephone Encounter (Signed)
Vincent [name] from [facility] stated he did pt evaluation yesterday He wants to do OT 1 x wk for 3 wks ADL [employer] equipment training [employer] in pain control  He needs an order for shower chair with back and arm rests  Don's number [phone]

## 2015-08-09 NOTE — Telephone Encounter (Signed)
Left message approving OT and also for Don to call me back with a fax number to fax the orders to.

## 2015-08-10 ENCOUNTER — Telehealth: Payer: Self-pay | Admitting: *Deleted

## 2015-08-10 NOTE — Telephone Encounter (Signed)
Receive call pt states his physical therapist told him to contact his md due to elevated BP. Think he need to be back on his BP medicine. Today is was 159/80, and yesterday it was 159/82...Vincent [name]

## 2015-08-10 NOTE — Telephone Encounter (Signed)
Notified pt with md response.../lmb 

## 2015-08-10 NOTE — Telephone Encounter (Signed)
Have him take at rest 3 times per week and average. Will not change at this time.

## 2015-08-11 DIAGNOSIS — D649 Anemia, unspecified: Secondary | ICD-10-CM

## 2015-08-16 ENCOUNTER — Ambulatory Visit (INDEPENDENT_AMBULATORY_CARE_PROVIDER_SITE_OTHER): Payer: Medicare HMO | Admitting: Internal Medicine

## 2015-08-16 ENCOUNTER — Encounter: Payer: Self-pay | Admitting: Internal Medicine

## 2015-08-16 ENCOUNTER — Ambulatory Visit (HOSPITAL_BASED_OUTPATIENT_CLINIC_OR_DEPARTMENT_OTHER): Payer: Medicare HMO

## 2015-08-16 ENCOUNTER — Telehealth: Payer: Self-pay | Admitting: Internal Medicine

## 2015-08-16 VITALS — BP 158/100 | HR 113 | Wt 139.0 lb

## 2015-08-16 VITALS — BP 141/91 | HR 98 | Temp 98.7°F | Resp 18

## 2015-08-16 DIAGNOSIS — D500 Iron deficiency anemia secondary to blood loss (chronic): Secondary | ICD-10-CM

## 2015-08-16 DIAGNOSIS — I10 Essential (primary) hypertension: Secondary | ICD-10-CM

## 2015-08-16 DIAGNOSIS — K922 Gastrointestinal hemorrhage, unspecified: Secondary | ICD-10-CM | POA: Diagnosis not present

## 2015-08-16 DIAGNOSIS — T887XXA Unspecified adverse effect of drug or medicament, initial encounter: Secondary | ICD-10-CM

## 2015-08-16 DIAGNOSIS — T50905A Adverse effect of unspecified drugs, medicaments and biological substances, initial encounter: Secondary | ICD-10-CM

## 2015-08-16 DIAGNOSIS — D539 Nutritional anemia, unspecified: Secondary | ICD-10-CM

## 2015-08-16 MED ORDER — SODIUM CHLORIDE 0.9 % IV SOLN
Freq: Once | INTRAVENOUS | Status: AC
  Administered 2015-08-16: 09:00:00 via INTRAVENOUS

## 2015-08-16 MED ORDER — SODIUM CHLORIDE 0.9 % IV SOLN
510.0000 mg | Freq: Once | INTRAVENOUS | Status: AC
  Administered 2015-08-16: 510 mg via INTRAVENOUS
  Filled 2015-08-16: qty 17

## 2015-08-16 MED ORDER — ONDANSETRON HCL 4 MG PO TABS: 4.0000 mg | ORAL_TABLET | Freq: Three times a day (TID) | ORAL | Status: DC | PRN

## 2015-08-16 MED ORDER — DIPHENHYDRAMINE HCL 25 MG PO TABS: 25.0000 mg | ORAL_TABLET | Freq: Four times a day (QID) | ORAL | Status: DC | PRN

## 2015-08-16 NOTE — Assessment & Plan Note (Addendum)
9/16 The patient had an iron infusion (Ferumoxytol) this morning and left about a half hour later and was fine. Now he is home and he is breaking out in sweat, nauseated and dizzy when standing. His BP is 160/86 and HR is between 90-96". It was his second and last iron infusion.  Likely side effects w/Ferumoxytol IV. GI f/u in October

## 2015-08-16 NOTE — Patient Instructions (Signed)

## 2015-08-16 NOTE — Progress Notes (Signed)
Subjective:  Patient ID: Vincent [name], male    DOB: May 26, 1961  Age: 54 y.o. MRN: [id number]  CC: Dizziness and Nausea   HPI Vincent [name] presents for "Vincent [name] from [facility] called stating the patient had an iron infusion (Ferumoxytol) this morning and left about a half hour later and was fine. Now he is home and he is breaking out in sweat, nauseated and dizzy when standing His BP is 160/86 and HR is between 90-96". It was his second and last iron infusion. His back was itching too...  Outpatient Prescriptions Prior to Visit  Medication Sig Dispense Refill  . albuterol (PROVENTIL HFA;VENTOLIN HFA) 108 (90 BASE) MCG/ACT inhaler Inhale 2 puffs into the lungs every 6 (six) hours as needed for wheezing or shortness of breath. 1 Inhaler 2  . celecoxib (CELEBREX) 200 MG capsule Take 1 capsule (200 mg total) by mouth 2 (two) times daily. 60 capsule 2  . ferrous sulfate 325 (65 FE) MG tablet Take 325 mg by mouth daily with breakfast.    . nicotine (NICODERM CQ - DOSED IN MG/24 HOURS) 14 mg/24hr patch Place 1 patch (14 mg total) onto the skin daily. 21 patch 0  . tiotropium (SPIRIVA HANDIHALER) 18 MCG inhalation capsule Place 1 capsule (18 mcg total) into inhaler and inhale daily. 30 capsule 12   No facility-administered medications prior to visit.    ROS Review of Systems  Constitutional: Positive for fatigue. Negative for appetite change and unexpected weight change.  HENT: Negative for congestion, nosebleeds, sneezing, sore throat and trouble swallowing.   Eyes: Negative for itching and visual disturbance.  Respiratory: Negative for cough.   Cardiovascular: Positive for palpitations. Negative for chest pain and leg swelling.  Gastrointestinal: Positive for nausea and vomiting. Negative for diarrhea, blood in stool and abdominal distention.  Genitourinary: Negative for frequency and hematuria.  Musculoskeletal: Negative for back pain, joint swelling, gait problem and neck pain.  Skin:  Positive for color change. Negative for rash.  Neurological: Positive for weakness and light-headedness. Negative for dizziness, tremors and speech difficulty.  Psychiatric/Behavioral: Negative for sleep disturbance, dysphoric mood and agitation. The patient is not nervous/anxious.     Objective:  BP 158/100 mmHg  Pulse 113  Wt 139 lb (63.05 kg)  SpO2 97%  BP Readings from Last 3 Encounters:  08/16/15 158/100  08/16/15 141/91  08/09/15 136/78    Wt Readings from Last 3 Encounters:  08/16/15 139 lb (63.05 kg)  08/03/15 142 lb 12.8 oz (64.774 kg)  08/02/15 143 lb 11.2 oz (65.182 kg)    Physical Exam  Constitutional: He is oriented to person, place, and time. He appears well-developed. No distress.  NAD  HENT:  Mouth/Throat: Oropharynx is clear and moist.  Eyes: Conjunctivae are normal. Pupils are equal, round, and reactive to light.  Neck: Normal range of motion. No JVD present. No thyromegaly present.  Cardiovascular: Normal rate, regular rhythm, normal heart sounds and intact distal pulses.  Exam reveals no gallop and no friction rub.   No murmur heard. Pulmonary/Chest: Effort normal and breath sounds normal. No respiratory distress. He has no wheezes. He has no rales. He exhibits no tenderness.  Abdominal: Soft. Bowel sounds are normal. He exhibits no distension and no mass. There is no tenderness. There is no rebound and no guarding.  Musculoskeletal: Normal range of motion. He exhibits no edema or tenderness.  Lymphadenopathy:    He has no cervical adenopathy.  Neurological: He is alert and oriented to person, place, and time.  He has normal reflexes. No cranial nerve deficit. He exhibits normal muscle tone. He displays a negative Romberg sign. Coordination and gait normal.  Skin: Skin is warm and dry. No rash noted.  Psychiatric: He has a normal mood and affect. His behavior is normal. Judgment and thought content normal.    Lab Results  Component Value Date   WBC 6.6  08/02/2015   HGB 12.0* 08/02/2015   HCT 38.8 08/02/2015   PLT 160 08/02/2015   GLUCOSE 88 08/02/2015   CHOL 147 07/05/2013   TRIG 68 07/05/2013   HDL 48 07/05/2013   LDLCALC 85 07/05/2013   ALT 14 08/02/2015   AST 17 08/02/2015   NA 139 08/02/2015   K 4.6 08/02/2015   CL 108 07/13/2015   CREATININE 1.2 08/02/2015   BUN 7.5 08/02/2015   CO2 23 08/02/2015   TSH 1.920 04/07/2015   INR 1.0 07/13/2015    Dg Chest 2 View  05/01/2015   CLINICAL DATA:  Follow-up of pneumonia, asymptomatic, history of COPD and previous tobacco use.  EXAM: CHEST  2 VIEW  COMPARISON:  PA and lateral chest of Apr 09, 2015 and May 07, 2015, as well as a chest CT scan of April 07, 2015.  FINDINGS: The lungs are mildly hyperinflated and clear. The heart and pulmonary vascularity are normal. The mediastinum is normal in width. The bony thorax is unremarkable.  IMPRESSION: COPD. There is no pneumonia nor other acute cardiopulmonary abnormality.   Electronically Signed   By: David  Martinique M.D.   On: 05/01/2015 11:49    Assessment & Plan:   Vincent [name] was seen today for dizziness and nausea.  Diagnoses and all orders for this visit:  Iron deficiency anemia due to chronic blood loss  Medication side effects, initial encounter  Other orders -     ondansetron (ZOFRAN) 4 MG tablet; Take 1 tablet (4 mg total) by mouth every 8 (eight) hours as needed for nausea or vomiting. -     diphenhydrAMINE (BENADRYL) 25 MG tablet; Take 1 tablet (25 mg total) by mouth every 6 (six) hours as needed for itching or allergies. -     methylPREDNISolone acetate (DEPO-MEDROL) injection 80 mg; Inject 1 mL (80 mg total) into the muscle once. -     ondansetron (ZOFRAN) injection 4 mg; Inject 2 mLs (4 mg total) into the muscle once.  I am having Vincent [name] start on ondansetron and diphenhydrAMINE. I am also having him maintain his nicotine, tiotropium, albuterol, ferrous sulfate, celecoxib, and SUPREP BOWEL PREP.  Meds ordered this encounter    Medications  . SUPREP BOWEL PREP SOLN    Sig:   . ondansetron (ZOFRAN) 4 MG tablet    Sig: Take 1 tablet (4 mg total) by mouth every 8 (eight) hours as needed for nausea or vomiting.    Dispense:  20 tablet    Refill:  0  . diphenhydrAMINE (BENADRYL) 25 MG tablet    Sig: Take 1 tablet (25 mg total) by mouth every 6 (six) hours as needed for itching or allergies.    Dispense:  60 tablet    Refill:  0     Follow-up: Return in about 2 weeks (around 08/30/2015).  Walker Kehr, MD

## 2015-08-16 NOTE — Telephone Encounter (Signed)
Patient is here and is being seen by Plotnikov.

## 2015-08-16 NOTE — Assessment & Plan Note (Signed)
Likely side effects w/Ferumoxytol IV. This was his final infusion Depomedrol 80 mg IM Zofran 4 mg IM Rx for Zofran and Benadryl po

## 2015-08-16 NOTE — Telephone Encounter (Signed)
Carrie from [facility] called stating the patient had an iron infusion this morning and left about a half hour later and was fine. Now he is home and he is breaking out in sweat, nauseated and dizzy when standing His BP is 160/86 and HR is between 90-96 She had me make an appointment for today with Dr Alain Marion. Her number is [phone]

## 2015-08-16 NOTE — Progress Notes (Signed)
Pre visit review using our clinic review tool, if applicable. No additional management support is needed unless otherwise documented below in the visit note. 

## 2015-08-17 MED ORDER — ONDANSETRON HCL 4 MG/2ML IJ SOLN
4.0000 mg | Freq: Once | INTRAMUSCULAR | Status: AC
  Administered 2015-08-16: 4 mg via INTRAMUSCULAR

## 2015-08-17 MED ORDER — METHYLPREDNISOLONE ACETATE 80 MG/ML IJ SUSP
80.0000 mg | Freq: Once | INTRAMUSCULAR | Status: AC
  Administered 2015-08-16: 80 mg via INTRAMUSCULAR

## 2015-08-17 NOTE — Assessment & Plan Note (Signed)
On Rx 

## 2015-08-21 ENCOUNTER — Telehealth: Payer: Self-pay | Admitting: Internal Medicine

## 2015-08-21 DIAGNOSIS — M542 Cervicalgia: Secondary | ICD-10-CM

## 2015-08-21 NOTE — Telephone Encounter (Signed)
How soon would you like him to come in?

## 2015-08-21 NOTE — Telephone Encounter (Signed)
Printed and signed, please fax. Needs visit sooner than February.

## 2015-08-21 NOTE — Telephone Encounter (Signed)
Patient needs order for shower chair with arm rests.  Please fax to [facility] [phone]

## 2015-08-22 NOTE — Telephone Encounter (Signed)
Before the end of the year, if his arthritis is better it can wait awhile, if no improvement with the celebrex then he should be back sooner.

## 2015-08-22 NOTE — Telephone Encounter (Signed)
Called pt no answer LMOM with md response.../lmb 

## 2015-09-11 ENCOUNTER — Ambulatory Visit (AMBULATORY_SURGERY_CENTER): Payer: Medicare HMO | Admitting: Internal Medicine

## 2015-09-11 ENCOUNTER — Encounter: Payer: Self-pay | Admitting: Internal Medicine

## 2015-09-11 VITALS — BP 159/97 | HR 95 | Temp 98.8°F | Resp 18 | Ht 66.0 in | Wt 143.0 lb

## 2015-09-11 DIAGNOSIS — B9681 Helicobacter pylori [H. pylori] as the cause of diseases classified elsewhere: Secondary | ICD-10-CM | POA: Diagnosis not present

## 2015-09-11 DIAGNOSIS — D509 Iron deficiency anemia, unspecified: Secondary | ICD-10-CM

## 2015-09-11 DIAGNOSIS — R634 Abnormal weight loss: Secondary | ICD-10-CM

## 2015-09-11 DIAGNOSIS — D125 Benign neoplasm of sigmoid colon: Secondary | ICD-10-CM | POA: Diagnosis not present

## 2015-09-11 DIAGNOSIS — D123 Benign neoplasm of transverse colon: Secondary | ICD-10-CM | POA: Diagnosis not present

## 2015-09-11 DIAGNOSIS — K625 Hemorrhage of anus and rectum: Secondary | ICD-10-CM

## 2015-09-11 DIAGNOSIS — K635 Polyp of colon: Secondary | ICD-10-CM | POA: Diagnosis not present

## 2015-09-11 DIAGNOSIS — J449 Chronic obstructive pulmonary disease, unspecified: Secondary | ICD-10-CM | POA: Diagnosis not present

## 2015-09-11 DIAGNOSIS — K2950 Unspecified chronic gastritis without bleeding: Secondary | ICD-10-CM | POA: Diagnosis not present

## 2015-09-11 DIAGNOSIS — I10 Essential (primary) hypertension: Secondary | ICD-10-CM | POA: Diagnosis not present

## 2015-09-11 MED ORDER — PANTOPRAZOLE SODIUM 40 MG PO TBEC: 40.0000 mg | DELAYED_RELEASE_TABLET | Freq: Every day | ORAL | Status: DC

## 2015-09-11 MED ORDER — SODIUM CHLORIDE 0.9 % IV SOLN: 500.0000 mL | INTRAVENOUS | Status: DC

## 2015-09-11 NOTE — Op Note (Signed)
[place]  [address]. [place], [address]   ENDOSCOPY PROCEDURE REPORT  PATIENT: [name], Vincent  MR#: [id number] BIRTHDATE: December 17, 1960 , 22  yrs. old GENDER: male ENDOSCOPIST: Jerene Bears, MD REFERRED BY:  Vertell Novak, M.D. PROCEDURE DATE:  09/11/2015 PROCEDURE:  EGD, diagnostic and EGD w/ biopsy ASA CLASS:     Class III INDICATIONS:  iron deficiency anemia and rectal bleeding, malnutrition. MEDICATIONS: Monitored anesthesia care, Propofol 150 mg IV, and Lidocaine 120 mg IV TOPICAL ANESTHETIC: none  DESCRIPTION OF PROCEDURE: After the risks benefits and alternatives of the procedure were thoroughly explained, informed consent was obtained.  The LB [id number] endoscope was introduced through the mouth and advanced to the second portion of the duodenum , Without limitations.  The instrument was slowly withdrawn as the mucosa was fully examined.   ESOPHAGUS: The mucosa of the esophagus appeared normal.  STOMACH: A small hiatal hernia was noted.   Severe acute gastritis (inflammation) was found in the cardia, gastric fundus, and gastric body.  There was adherent blood present.  Cold forcep biopsies were taken at the gastric body, antrum and angularis to evaluate for h. pylori.   No polyps or tumors seen.  DUODENUM: The duodenal mucosa showed no abnormalities in the bulb and 2nd part of the duodenum.  Cold forcep biopsies were taken in the second portion.  Retroflexed views revealed as previously described.     The scope was then withdrawn from the patient and the procedure completed.  COMPLICATIONS: There were no immediate complications.    ENDOSCOPIC IMPRESSION: 1.   The mucosa of the esophagus appeared normal 2.   Small hiatal hernia 3.   Acute gastritis (inflammation) was found in the cardia, gastric fundus, and gastric body 4.   The duodenal mucosa showed no abnormalities in the bulb and 2nd part of the duodenum cold forcep biopsies  were taken in the second portion  RECOMMENDATIONS: 1.  Await biopsy results 2.  Avoid NSAIDs 3.  Begin pantoprazole 40 mg daily 4.  Proceed with a Colonoscopy.  eSigned:  Jerene Bears, MD 09/11/2015 2:51 PM    CC: the patient, Dr. Doug Sou, Dr. Julien Nordmann  PATIENT NAME:  Vincent, [name] MR#: [id number]

## 2015-09-11 NOTE — Progress Notes (Signed)
Report to PACU, RN, vss, BBS= Clear.  

## 2015-09-11 NOTE — Progress Notes (Signed)
No problems noted in the recovery room. maw 

## 2015-09-11 NOTE — Op Note (Signed)
[place]  [address]. [place], [address]   COLONOSCOPY PROCEDURE REPORT  PATIENT: [name], Vincent [name]  MR#: [id number] BIRTHDATE: [birth year] , 60  yrs. old GENDER: male ENDOSCOPIST: Jerene Bears, MD REFERRED ZH:[id number] Doug Sou, M.D. PROCEDURE DATE:  09/11/2015 PROCEDURE:   Colonoscopy, diagnostic and Colonoscopy with cold biopsy polypectomy First Screening Colonoscopy - Avg.  risk and is 50 yrs.  old or older - No.  Prior Negative Screening - Now for repeat screening. N/A  History of Adenoma - Now for follow-up colonoscopy & has been > or = to 3 yrs.  N/A  Polyps removed today? Yes ASA CLASS:   Class III INDICATIONS:rectal bleeding and iron deficiency anemia. MEDICATIONS: Monitored anesthesia care, this was the total dose used for all procedures at this session, and Propofol 340 mg IV  DESCRIPTION OF PROCEDURE:   After the risks benefits and alternatives of the procedure were thoroughly explained, informed consent was obtained.  The digital rectal exam revealed several skin tags.   The LB [id number]  endoscope was introduced through the anus and advanced to the cecum, which was identified by both the appendix and ileocecal valve. No adverse events experienced.   The quality of the prep was (Suprep was used) fair. The instrument was then slowly withdrawn as the colon was fully examined. Estimated blood loss is zero unless otherwise noted in this procedure report.   COLON FINDINGS: 2 mm non-bleeding angiodysplastic lesion was found in the ascending colon.   Three sessile polyps ranging between 3-52mm in size were found in the transverse colon and sigmoid colon. Polypectomies were performed with cold forceps.  The resection was complete, the polyp tissue was completely retrieved and sent to histology.  Retroflexed views revealed internal Grade III hemorrhoids. The time to cecum = 3.0 Withdrawal time = 14.7   The scope was withdrawn and the procedure  completed. COMPLICATIONS: There were no immediate complications.  ENDOSCOPIC IMPRESSION: 1.   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   Three sessile polyps ranging between 3-23mm in size were found in the transverse colon and sigmoid colon; polypectomies were performed with cold forceps 3.   Moderate to large internal hemorrhoids  RECOMMENDATIONS: 1.  Await pathology results 2.  If rectal bleeding continues to be problematic would consider nonsurgical hemorrhoidal banding for bleeding internal hemorrhoids 3.  Timing of repeat colonoscopy will be determined by pathology findings. 4.  You will receive a letter within 1-2 weeks with the results of your biopsy as well as final recommendations.  Please call my office if you have not received a letter after 3 weeks.  eSigned:  Jerene Bears, MD 09/11/2015 2:59 PM cc:  the patient, Dr. Doug Sou, Dr. Julien Nordmann

## 2015-09-11 NOTE — Progress Notes (Signed)
Patient's primary care MD is Vertell Novak. Patient did not bring his inhalers with him today. Patient completed his prep, stating stools are not brownish liquid and is translucent.

## 2015-09-11 NOTE — Patient Instructions (Addendum)
YOU HAD AN ENDOSCOPIC PROCEDURE TODAY AT THE [facility]:   Refer to the procedure report that was given to you for any specific questions about what was found during the examination.  If the procedure report does not answer your questions, please call your gastroenterologist to clarify.  If you requested that your care partner not be given the details of your procedure findings, then the procedure report has been included in a sealed envelope for you to review at your convenience later.  YOU SHOULD EXPECT: Some feelings of bloating in the abdomen. Passage of more gas than usual.  Walking can help get rid of the air that was put into your GI tract during the procedure and reduce the bloating. If you had a lower endoscopy (such as a colonoscopy or flexible sigmoidoscopy) you may notice spotting of blood in your stool or on the toilet paper. If you underwent a bowel prep for your procedure, you may not have a normal bowel movement for a few days.  Please Note:  You might notice some irritation and congestion in your nose or some drainage.  This is from the oxygen used during your procedure.  There is no need for concern and it should clear up in a day or so.  SYMPTOMS TO REPORT IMMEDIATELY:   Following lower endoscopy (colonoscopy or flexible sigmoidoscopy):  Excessive amounts of blood in the stool  Significant tenderness or worsening of abdominal pains  Swelling of the abdomen that is new, acute  Fever of 100F or higher   Following upper endoscopy (EGD)  Vomiting of blood or coffee ground material  New chest pain or pain under the shoulder blades  Painful or persistently difficult swallowing  New shortness of breath  Fever of 100F or higher  Black, tarry-looking stools  For urgent or emergent issues, a gastroenterologist can be reached at any hour by calling [phone].   DIET: Your first meal following the procedure should be a small meal and then it is ok to progress to  your normal diet. Heavy or fried foods are harder to digest and may make you feel nauseous or bloated.  Likewise, meals heavy in dairy and vegetables can increase bloating.  Drink plenty of fluids but you should avoid alcoholic beverages for 24 hours.  ACTIVITY:  You should plan to take it easy for the rest of today and you should NOT DRIVE or use heavy machinery until tomorrow (because of the sedation medicines used during the test).    FOLLOW UP: Our staff will call the number listed on your records the next business day following your procedure to check on you and address any questions or concerns that you may have regarding the information given to you following your procedure. If we do not reach you, we will leave a message.  However, if you are feeling well and you are not experiencing any problems, there is no need to return our call.  We will assume that you have returned to your regular daily activities without incident.  If any biopsies were taken you will be contacted by phone or by letter within the next 1-3 weeks.  Please call us at [phone] if you have not heard about the biopsies in 3 weeks.    SIGNATURES/CONFIDENTIALITY: You and/or your care partner have signed paperwork which will be entered into your electronic medical record.  These signatures attest to the fact that that the information above on your After Visit Summary has been reviewed   and is understood.  Full responsibility of the confidentiality of this discharge information lies with you and/or your care-partner.   Handouts were given to your care partner on  Hiatal hernia,gastrtis, polyps, hemorrhoids, a high fiber diet with liberal fluid intake, and [facility] system info pamphlet. AVOID NSAIDs medications.  Begin PANTOPRAZOLE 40 mg each day 20 - 30 minutes before breakfast daily. You may resume your current medications today. Await biopsy results. Please call if any questions or concerns.

## 2015-09-11 NOTE — Progress Notes (Signed)
Called to room to assist during endoscopic procedure.  Patient ID and intended procedure confirmed with present staff. Received instructions for my participation in the procedure from the performing physician.  

## 2015-09-12 ENCOUNTER — Telehealth: Payer: Self-pay | Admitting: *Deleted

## 2015-09-12 NOTE — Telephone Encounter (Signed)
  Follow up Call-  Call back number 09/11/2015  Post procedure Call Back phone  # [phone]  Permission to leave phone message Yes     Patient questions:  Do you have a fever, pain , or abdominal swelling? No. Pain Score  0 *  Have you tolerated food without any problems? Yes.    Have you been able to return to your normal activities? Yes.    Do you have any questions about your discharge instructions: Diet   No. Medications  No. Follow up visit  No.  Do you have questions or concerns about your Care? No.  Actions: * If pain score is 4 or above: No action needed, pain <4.

## 2015-09-14 ENCOUNTER — Other Ambulatory Visit: Payer: Self-pay | Admitting: *Deleted

## 2015-09-14 ENCOUNTER — Encounter: Payer: Self-pay | Admitting: Internal Medicine

## 2015-09-14 DIAGNOSIS — D509 Iron deficiency anemia, unspecified: Secondary | ICD-10-CM

## 2015-09-14 DIAGNOSIS — K2970 Gastritis, unspecified, without bleeding: Secondary | ICD-10-CM

## 2015-09-14 MED ORDER — PANTOPRAZOLE SODIUM 40 MG PO TBEC: DELAYED_RELEASE_TABLET | ORAL | Status: DC

## 2015-09-14 MED ORDER — BIS SUBCIT-METRONID-TETRACYC 140-125-125 MG PO CAPS: 3.0000 | ORAL_CAPSULE | Freq: Three times a day (TID) | ORAL | Status: DC

## 2015-09-15 ENCOUNTER — Telehealth: Payer: Self-pay | Admitting: Internal Medicine

## 2015-09-15 MED ORDER — BIS SUBCIT-METRONID-TETRACYC 140-125-125 MG PO CAPS: 3.0000 | ORAL_CAPSULE | Freq: Three times a day (TID) | ORAL | Status: DC

## 2015-09-15 NOTE — Telephone Encounter (Signed)
Patient states that Pylera was $800 at the pharmacy. I have advised that I will place a 10 day course of Pylera at the front desk for pick up. He verbalizes understanding.

## 2015-09-18 ENCOUNTER — Telehealth: Payer: Self-pay | Admitting: Internal Medicine

## 2015-09-18 NOTE — Telephone Encounter (Signed)
Patient states that he was told by [facility] that a personal care assistant waxs needed for his home. We have not gotten any notification on this based on the notes. Please investigate and follow up ./

## 2015-09-27 ENCOUNTER — Telehealth: Payer: Self-pay | Admitting: Internal Medicine

## 2015-09-27 ENCOUNTER — Ambulatory Visit (HOSPITAL_BASED_OUTPATIENT_CLINIC_OR_DEPARTMENT_OTHER): Payer: Medicare HMO

## 2015-09-27 ENCOUNTER — Encounter: Payer: Self-pay | Admitting: Internal Medicine

## 2015-09-27 ENCOUNTER — Ambulatory Visit (HOSPITAL_BASED_OUTPATIENT_CLINIC_OR_DEPARTMENT_OTHER): Payer: Medicare HMO | Admitting: Internal Medicine

## 2015-09-27 VITALS — BP 163/78 | HR 71 | Temp 98.2°F | Resp 18 | Ht 66.0 in | Wt 142.5 lb

## 2015-09-27 DIAGNOSIS — D500 Iron deficiency anemia secondary to blood loss (chronic): Secondary | ICD-10-CM

## 2015-09-27 DIAGNOSIS — D563 Thalassemia minor: Secondary | ICD-10-CM

## 2015-09-27 DIAGNOSIS — D539 Nutritional anemia, unspecified: Secondary | ICD-10-CM

## 2015-09-27 LAB — CBC WITH DIFFERENTIAL/PLATELET
BASO%: 1.1 % (ref 0.0–2.0)
Basophils Absolute: 0.1 10*3/uL (ref 0.0–0.1)
EOS%: 1.6 % (ref 0.0–7.0)
Eosinophils Absolute: 0.1 10*3/uL (ref 0.0–0.5)
HEMATOCRIT: 39.7 % (ref 38.4–49.9)
HEMOGLOBIN: 12.8 g/dL — AB (ref 13.0–17.1)
LYMPH#: 1.2 10*3/uL (ref 0.9–3.3)
LYMPH%: 21.8 % (ref 14.0–49.0)
MCH: 24.4 pg — AB (ref 27.2–33.4)
MCHC: 32.2 g/dL (ref 32.0–36.0)
MCV: 75.6 fL — ABNORMAL LOW (ref 79.3–98.0)
MONO#: 0.7 10*3/uL (ref 0.1–0.9)
MONO%: 12.6 % (ref 0.0–14.0)
NEUT%: 62.9 % (ref 39.0–75.0)
NEUTROS ABS: 3.4 10*3/uL (ref 1.5–6.5)
Platelets: 134 10*3/uL — ABNORMAL LOW (ref 140–400)
RBC: 5.25 10*6/uL (ref 4.20–5.82)
RDW: 19 % — AB (ref 11.0–14.6)
WBC: 5.5 10*3/uL (ref 4.0–10.3)
nRBC: 0 % (ref 0–0)

## 2015-09-27 LAB — FERRITIN CHCC: FERRITIN: 124 ng/mL (ref 22–316)

## 2015-09-27 LAB — IRON AND TIBC CHCC
%SAT: 58 % — AB (ref 20–55)
IRON: 128 ug/dL (ref 42–163)
TIBC: 223 ug/dL (ref 202–409)
UIBC: 94 ug/dL — ABNORMAL LOW (ref 117–376)

## 2015-09-27 NOTE — Progress Notes (Signed)
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  PRIOR THERAPY: Feraheme 510 mg IV 2 doses last dose was given on 08/16/2015.  CURRENT THERAPY: None  INTERVAL HISTORY: Vincent [name] 54 y.o. male returns to the clinic today for follow-up visit accompanied by his wife. The patient is feeling much better after receiving Feraheme infusion. He denied having any significant fatigue or weakness. He has no weight loss or night sweats. He has no chest pain, shortness breath, cough or hemoptysis. He underwent upper endoscopy and colonoscopy by Dr. Hilarie Fredrickson and it showed gastritis and H. pylori in addition to small colon polyps and hemorrhoids. He is currently undergoing treatment for H. pylori. He had repeat CBC, iron study and ferritin performed earlier today and he is here for evaluation and discussion of his lab results.   MEDICAL HISTORY: Past Medical History  Diagnosis Date  . Glaucoma   . Hypertension   . Tobacco use disorder 04/10/2015  . Anemia   . Severe protein-calorie malnutrition ([place])   . CAP (community acquired pneumonia)   . COPD (chronic obstructive pulmonary disease) ([place])   . Blood transfusion without reported diagnosis     ALLERGIES:  has No Known Allergies.  MEDICATIONS:  Current Outpatient Prescriptions  Medication Sig Dispense Refill  . albuterol (PROVENTIL HFA;VENTOLIN HFA) 108 (90 BASE) MCG/ACT inhaler Inhale 2 puffs into the lungs every 6 (six) hours as needed for wheezing or shortness of breath. 1 Inhaler 2  . bismuth-metronidazole-tetracycline (PYLERA) 140-125-125 MG capsule Take 3 capsules by mouth 4 (four) times daily -  before meals and at bedtime. 120 capsule 0  . celecoxib (CELEBREX) 200 MG capsule Take 1 capsule (200 mg total) by mouth  2 (two) times daily. 60 capsule 2  . diphenhydrAMINE (BENADRYL) 25 MG tablet Take 1 tablet (25 mg total) by mouth every 6 (six) hours as needed for itching or allergies. 60 tablet 0  . ferrous sulfate 325 (65 FE) MG tablet Take 325 mg by mouth daily with breakfast.    . nicotine (NICODERM CQ - DOSED IN MG/24 HOURS) 14 mg/24hr patch Place 1 patch (14 mg total) onto the skin daily. 21 patch 0  . ondansetron (ZOFRAN) 4 MG tablet Take 1 tablet (4 mg total) by mouth every 8 (eight) hours as needed for nausea or vomiting. 20 tablet 0  . pantoprazole (PROTONIX) 40 MG tablet Take one po BID x 10 days then daily 90 tablet 3  . tiotropium (SPIRIVA HANDIHALER) 18 MCG inhalation capsule Place 1 capsule (18 mcg total) into inhaler and inhale daily. 30 capsule 12   No current facility-administered medications for this visit.    SURGICAL HISTORY:  Past Surgical History  Procedure Laterality Date  . Plate in neck    . Right shoulder arthroscopy      REVIEW OF SYSTEMS:  A comprehensive review of systems was negative.   PHYSICAL EXAMINATION: General appearance: alert, cooperative and no distress Head: Normocephalic, without obvious abnormality, atraumatic Neck: no adenopathy, no JVD, supple, symmetrical, trachea midline and thyroid not enlarged, symmetric, no tenderness/mass/nodules Lymph nodes: Cervical, supraclavicular, and axillary nodes normal. Resp: clear to auscultation bilaterally Back: symmetric, no curvature. ROM normal. No CVA tenderness. Cardio: regular rate and rhythm, S1, S2 normal, no murmur, click, rub or gallop GI: soft, non-tender;  bowel sounds normal; no masses,  no organomegaly Extremities: extremities normal, atraumatic, no cyanosis or edema  ECOG PERFORMANCE STATUS: 0 - Asymptomatic  Blood pressure 163/78, pulse 71, temperature 98.2 F (36.8 C), temperature source Oral, resp. rate 18, height 5\' 6"  (1.676 m), weight 142 lb 8 oz (64.638 kg), SpO2 100 %.  LABORATORY DATA: Lab  Results  Component Value Date   WBC 5.5 09/27/2015   HGB 12.8* 09/27/2015   HCT 39.7 09/27/2015   MCV 75.6* 09/27/2015   PLT 134* 09/27/2015      Chemistry      Component Value Date/Time   NA 139 08/02/2015 1227   NA 142 07/13/2015 1018   K 4.6 08/02/2015 1227   K 4.2 07/13/2015 1018   CL 108 07/13/2015 1018   CO2 23 08/02/2015 1227   CO2 26 07/13/2015 1018   BUN 7.5 08/02/2015 1227   BUN 8 07/13/2015 1018   CREATININE 1.2 08/02/2015 1227   CREATININE 1.06 07/13/2015 1018   CREATININE 1.12 07/05/2013 1113      Component Value Date/Time   CALCIUM 9.7 08/02/2015 1227   CALCIUM 9.5 07/13/2015 1018   ALKPHOS 77 08/02/2015 1227   ALKPHOS 69 07/13/2015 1018   AST 17 08/02/2015 1227   AST 23 07/13/2015 1018   ALT 14 08/02/2015 1227   ALT 16 07/13/2015 1018   BILITOT 0.52 08/02/2015 1227   BILITOT 0.4 07/13/2015 1018       RADIOGRAPHIC STUDIES: No results found.  ASSESSMENT AND PLAN: This is a very pleasant 54 years old African-American male with iron deficiency anemia secondary to GI blood loss in addition to thalassemia minor. He is status post Feraheme infusion with significant improvement in his hemoglobin and hematocrit. His iron study and ferritin are still pending. I recommended for the patient to continue his current treatment for the H. pylori for now. I will see him back for follow-up visit in 3 months for reevaluation after repeating CBC, iron study and ferritin. The patient was advised to call immediately if he has any concerning symptoms in the interval. The patient voices understanding of current disease status and treatment options and is in agreement with the current care plan.  All questions were answered. The patient knows to call the clinic with any problems, questions or concerns. We can certainly see the patient much sooner if necessary.  Disclaimer: This note was dictated with voice recognition software. Similar sounding words can inadvertently be  transcribed and may not be corrected upon review.

## 2015-09-27 NOTE — Telephone Encounter (Signed)
per pof to sch pt appt-gave pt copy of avs °

## 2015-11-10 ENCOUNTER — Telehealth: Payer: Self-pay

## 2015-11-10 NOTE — Telephone Encounter (Signed)
-----   Message from Hulan Saas, RN sent at 09/14/2015  3:31 PM EDT ----- Call and remind patient due for H. Pylori stool study for J[facility]. Lab in [facility].

## 2015-11-10 NOTE — Telephone Encounter (Signed)
Pt aware and knows to hold his protonix for 2 weeks.

## 2015-12-25 ENCOUNTER — Other Ambulatory Visit (HOSPITAL_BASED_OUTPATIENT_CLINIC_OR_DEPARTMENT_OTHER): Payer: Medicare HMO

## 2015-12-25 DIAGNOSIS — D563 Thalassemia minor: Secondary | ICD-10-CM

## 2015-12-25 DIAGNOSIS — D500 Iron deficiency anemia secondary to blood loss (chronic): Secondary | ICD-10-CM

## 2015-12-25 LAB — IRON AND TIBC
%SAT: 56 % — ABNORMAL HIGH (ref 20–55)
Iron: 137 ug/dL (ref 42–163)
TIBC: 246 ug/dL (ref 202–409)
UIBC: 109 ug/dL — ABNORMAL LOW (ref 117–376)

## 2015-12-25 LAB — CBC WITH DIFFERENTIAL/PLATELET
BASO%: 4.3 % — ABNORMAL HIGH (ref 0.0–2.0)
BASOS ABS: 0.1 10*3/uL (ref 0.0–0.1)
EOS%: 3.1 % (ref 0.0–7.0)
Eosinophils Absolute: 0.1 10*3/uL (ref 0.0–0.5)
HEMATOCRIT: 41 % (ref 38.4–49.9)
HGB: 12.8 g/dL — ABNORMAL LOW (ref 13.0–17.1)
LYMPH#: 1.1 10*3/uL (ref 0.9–3.3)
LYMPH%: 33.8 % (ref 14.0–49.0)
MCH: 24.2 pg — AB (ref 27.2–33.4)
MCHC: 31.2 g/dL — AB (ref 32.0–36.0)
MCV: 77.4 fL — AB (ref 79.3–98.0)
MONO#: 0.5 10*3/uL (ref 0.1–0.9)
MONO%: 15.2 % — ABNORMAL HIGH (ref 0.0–14.0)
NEUT#: 1.4 10*3/uL — ABNORMAL LOW (ref 1.5–6.5)
NEUT%: 43.6 % (ref 39.0–75.0)
PLATELETS: 133 10*3/uL — AB (ref 140–400)
RBC: 5.29 10*6/uL (ref 4.20–5.82)
RDW: 15.1 % — ABNORMAL HIGH (ref 11.0–14.6)
WBC: 3.1 10*3/uL — ABNORMAL LOW (ref 4.0–10.3)

## 2015-12-25 LAB — FERRITIN: Ferritin: 96 ng/ml (ref 22–316)

## 2015-12-28 ENCOUNTER — Encounter: Payer: Self-pay | Admitting: Internal Medicine

## 2015-12-28 ENCOUNTER — Ambulatory Visit (HOSPITAL_BASED_OUTPATIENT_CLINIC_OR_DEPARTMENT_OTHER): Payer: Medicare HMO | Admitting: Internal Medicine

## 2015-12-28 ENCOUNTER — Telehealth: Payer: Self-pay | Admitting: Internal Medicine

## 2015-12-28 VITALS — BP 161/83 | HR 85 | Temp 98.6°F | Resp 18 | Ht 66.0 in | Wt 138.3 lb

## 2015-12-28 DIAGNOSIS — D500 Iron deficiency anemia secondary to blood loss (chronic): Secondary | ICD-10-CM

## 2015-12-28 DIAGNOSIS — D563 Thalassemia minor: Secondary | ICD-10-CM | POA: Diagnosis not present

## 2015-12-28 NOTE — Telephone Encounter (Signed)
Appointments made and avs pritned for pateint °

## 2015-12-28 NOTE — Progress Notes (Signed)
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  PRIOR THERAPY: Feraheme 510 mg IV 2 doses last dose was given on 08/16/2015.  CURRENT THERAPY: None  INTERVAL HISTORY: Vincent [name] 55 y.o. male returns to the clinic today for follow-up visit accompanied by his wife. The patient is still feeling good with no specific complaints. He denied having any significant fatigue or weakness. He has no weight loss or night sweats. He has no chest pain, shortness breath, cough or hemoptysis. He underwent treatment for H. pylori by Dr. Hilarie Fredrickson. He had repeat CBC, iron study and ferritin performed earlier today and he is here for evaluation and discussion of his lab results.   MEDICAL HISTORY: Past Medical History  Diagnosis Date  . Glaucoma   . Hypertension   . Tobacco use disorder 04/10/2015  . Anemia   . Severe protein-calorie malnutrition ([place])   . CAP (community acquired pneumonia)   . COPD (chronic obstructive pulmonary disease) ([place])   . Blood transfusion without reported diagnosis     ALLERGIES:  has No Known Allergies.  MEDICATIONS:  Current Outpatient Prescriptions  Medication Sig Dispense Refill  . albuterol (PROVENTIL HFA;VENTOLIN HFA) 108 (90 BASE) MCG/ACT inhaler Inhale 2 puffs into the lungs every 6 (six) hours as needed for wheezing or shortness of breath. 1 Inhaler 2  . bismuth-metronidazole-tetracycline (PYLERA) 140-125-125 MG capsule Take 3 capsules by mouth 4 (four) times daily -  before meals and at bedtime. 120 capsule 0  . celecoxib (CELEBREX) 200 MG capsule Take 1 capsule (200 mg total) by mouth 2 (two) times daily. (Patient not taking: Reported on 09/27/2015) 60 capsule 2  . diphenhydrAMINE (BENADRYL) 25 MG tablet Take 1 tablet (25 mg total) by  mouth every 6 (six) hours as needed for itching or allergies. (Patient not taking: Reported on 09/27/2015) 60 tablet 0  . ferrous sulfate 325 (65 FE) MG tablet Take 325 mg by mouth daily with breakfast.    . nicotine (NICODERM CQ - DOSED IN MG/24 HOURS) 14 mg/24hr patch Place 1 patch (14 mg total) onto the skin daily. 21 patch 0  . ondansetron (ZOFRAN) 4 MG tablet Take 1 tablet (4 mg total) by mouth every 8 (eight) hours as needed for nausea or vomiting. (Patient not taking: Reported on 09/27/2015) 20 tablet 0  . pantoprazole (PROTONIX) 40 MG tablet Take one po BID x 10 days then daily 90 tablet 3  . tiotropium (SPIRIVA HANDIHALER) 18 MCG inhalation capsule Place 1 capsule (18 mcg total) into inhaler and inhale daily. 30 capsule 12   No current facility-administered medications for this visit.    SURGICAL HISTORY:  Past Surgical History  Procedure Laterality Date  . Plate in neck    . Right shoulder arthroscopy      REVIEW OF SYSTEMS:  A comprehensive review of systems was negative.   PHYSICAL EXAMINATION: General appearance: alert, cooperative and no distress Head: Normocephalic, without obvious abnormality, atraumatic Neck: no adenopathy, no JVD, supple, symmetrical, trachea midline and thyroid not enlarged, symmetric, no tenderness/mass/nodules Lymph nodes: Cervical, supraclavicular, and axillary nodes normal. Resp: clear to auscultation bilaterally Back: symmetric, no curvature. ROM normal. No CVA tenderness. Cardio: regular rate and rhythm, S1, S2 normal, no murmur, click, rub or gallop GI: soft, non-tender; bowel sounds normal; no masses,  no organomegaly Extremities: extremities normal, atraumatic, no cyanosis or edema  ECOG PERFORMANCE STATUS: 0 - Asymptomatic  Blood pressure 161/83, pulse 85, temperature 98.6 F (37 C), temperature source Oral, resp. rate 18, height 5\' 6"  (1.676 m), weight 138 lb 4.8 oz (62.732 kg), SpO2 100 %.  LABORATORY DATA: Lab Results  Component  Value Date   WBC 3.1* 12/25/2015   HGB 12.8* 12/25/2015   HCT 41.0 12/25/2015   MCV 77.4* 12/25/2015   PLT 133* 12/25/2015      Chemistry      Component Value Date/Time   NA 139 08/02/2015 1227   NA 142 07/13/2015 1018   K 4.6 08/02/2015 1227   K 4.2 07/13/2015 1018   CL 108 07/13/2015 1018   CO2 23 08/02/2015 1227   CO2 26 07/13/2015 1018   BUN 7.5 08/02/2015 1227   BUN 8 07/13/2015 1018   CREATININE 1.2 08/02/2015 1227   CREATININE 1.06 07/13/2015 1018   CREATININE 1.12 07/05/2013 1113      Component Value Date/Time   CALCIUM 9.7 08/02/2015 1227   CALCIUM 9.5 07/13/2015 1018   ALKPHOS 77 08/02/2015 1227   ALKPHOS 69 07/13/2015 1018   AST 17 08/02/2015 1227   AST 23 07/13/2015 1018   ALT 14 08/02/2015 1227   ALT 16 07/13/2015 1018   BILITOT 0.52 08/02/2015 1227   BILITOT 0.4 07/13/2015 1018       RADIOGRAPHIC STUDIES: No results found.  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 He is status post Feraheme infusion with significant improvement in his hemoglobin and hematocrit. His CBC showed mild anemia with hemoglobin of 12.8. Iron study is unremarkable. I recommended for the patient to continue his current treatment for the H. pylori for now. I will see him back for follow-up visit in 3 months for reevaluation after repeating CBC, iron study and ferritin. The patient was advised to call immediately if he has any concerning symptoms in the interval. The patient voices understanding of current disease status and treatment options and is in agreement with the current care plan. All questions were answered. The patient knows to call the clinic with any problems, questions or concerns. We can certainly see the patient much sooner if necessary.  Disclaimer: This note was dictated with voice recognition software. Similar sounding words can  inadvertently be transcribed and may not be corrected upon review.

## 2015-12-29 ENCOUNTER — Encounter (HOSPITAL_COMMUNITY): Payer: Self-pay | Admitting: Emergency Medicine

## 2015-12-29 ENCOUNTER — Emergency Department (HOSPITAL_COMMUNITY)
Admission: EM | Admit: 2015-12-29 | Discharge: 2015-12-29 | Disposition: A | Discharge status: Home / Self Care | Payer: Medicare HMO | Attending: Emergency Medicine | Admitting: Emergency Medicine

## 2015-12-29 DIAGNOSIS — I10 Essential (primary) hypertension: Secondary | ICD-10-CM | POA: Insufficient documentation

## 2015-12-29 DIAGNOSIS — Z8701 Personal history of pneumonia (recurrent): Secondary | ICD-10-CM | POA: Diagnosis not present

## 2015-12-29 DIAGNOSIS — E611 Iron deficiency: Secondary | ICD-10-CM | POA: Insufficient documentation

## 2015-12-29 DIAGNOSIS — Z79899 Other long term (current) drug therapy: Secondary | ICD-10-CM | POA: Diagnosis not present

## 2015-12-29 DIAGNOSIS — Z87891 Personal history of nicotine dependence: Secondary | ICD-10-CM | POA: Diagnosis not present

## 2015-12-29 DIAGNOSIS — D649 Anemia, unspecified: Secondary | ICD-10-CM | POA: Diagnosis not present

## 2015-12-29 DIAGNOSIS — J449 Chronic obstructive pulmonary disease, unspecified: Secondary | ICD-10-CM | POA: Insufficient documentation

## 2015-12-29 DIAGNOSIS — Z8669 Personal history of other diseases of the nervous system and sense organs: Secondary | ICD-10-CM | POA: Diagnosis not present

## 2015-12-29 DIAGNOSIS — R040 Epistaxis: Secondary | ICD-10-CM | POA: Diagnosis not present

## 2015-12-29 DIAGNOSIS — Z792 Long term (current) use of antibiotics: Secondary | ICD-10-CM | POA: Diagnosis not present

## 2015-12-29 DIAGNOSIS — Z8639 Personal history of other endocrine, nutritional and metabolic disease: Secondary | ICD-10-CM | POA: Insufficient documentation

## 2015-12-29 DIAGNOSIS — Z87898 Personal history of other specified conditions: Secondary | ICD-10-CM

## 2015-12-29 LAB — I-STAT CHEM 8, ED
CHLORIDE: 109 mmol/L (ref 101–111)
Calcium, Ion: 1.13 mmol/L (ref 1.12–1.23)
Creatinine, Ser: 1.3 mg/dL — ABNORMAL HIGH (ref 0.61–1.24)
Glucose, Bld: 96 mg/dL (ref 65–99)
HCT: 45 % (ref 39.0–52.0)
Hemoglobin: 15.3 g/dL (ref 13.0–17.0)
POTASSIUM: 4.1 mmol/L (ref 3.5–5.1)
SODIUM: 143 mmol/L (ref 135–145)
TCO2: 21 mmol/L (ref 0–100)

## 2015-12-29 MED ORDER — SALINE SPRAY 0.65 % NA SOLN: 1.0000 | NASAL | Status: DC | PRN

## 2015-12-29 NOTE — ED Notes (Signed)
Patient nose bleeding just started bleeding today. Patient is not on any blood thinners. Nose is not currently bleeding. Patient is complaining of congestion.

## 2015-12-29 NOTE — ED Provider Notes (Signed)
CSN: [id number]     Arrival date & time 12/29/15  2110 History  By signing my name below, I, Emmanuella Mensah, attest that this documentation has been prepared under the direction and in the presence of Montine Circle, PA-C. Electronically Signed: Judithann Sauger, ED Scribe. 12/29/2015. 9:43 PM.     Chief Complaint  Patient presents with  . Epistaxis   The history is provided by the patient. No language interpreter was used.    HPI Comments: Vincent [name] is a 55 y.o. male with a hx of HTN, COPD, iron deficiency, and anemia who presents to the Emergency Department complaining of two resolved episodes of epistaxis of his left nostril with large blood clots that lasted for approx. 15 minutes each onset today. He reports associated non-productive cough and fatigue. He explains that his nose started to randomly bleed but he had rhinorrhea earlier this week. He denies currently being on any blood thinners. He states that his PCP at the [facility] informed him that his WBC is low. No fever, numbness, or dizziness.         Past Medical History  Diagnosis Date  . Glaucoma   . Hypertension   . Tobacco use disorder 04/10/2015  . Anemia   . Severe protein-calorie malnutrition ([place])   . CAP (community acquired pneumonia)   . COPD (chronic obstructive pulmonary disease) ([place])   . Blood transfusion without reported diagnosis    Past Surgical History  Procedure Laterality Date  . Plate in neck    . Right shoulder arthroscopy     Family History  Problem Relation Age of Onset  . Heart disease Father   . Sickle cell anemia Mother     deceased  . Diabetes Sister    Social History  Substance Use Topics  . Smoking status: Former Smoker -- 0.50 packs/day for 40 years    Types: Cigarettes    Quit date: 03/10/2015  . Smokeless tobacco: Never Used  . Alcohol Use: No     Comment: occasionally     Review of Systems  Constitutional: Negative for fever.  HENT: Positive for nosebleeds.    Neurological: Negative for dizziness and numbness.      Allergies  Review of patient's allergies indicates no known allergies.  Home Medications   Prior to Admission medications   Medication Sig Start Date End Date Taking? Authorizing Provider  albuterol (PROVENTIL HFA;VENTOLIN HFA) 108 (90 BASE) MCG/ACT inhaler Inhale 2 puffs into the lungs every 6 (six) hours as needed for wheezing or shortness of breath. 04/13/15   Costin Karlyne Greenspan, MD  bismuth-metronidazole-tetracycline (PYLERA) [phone] MG capsule Take 3 capsules by mouth 4 (four) times daily -  before meals and at bedtime. 09/15/15   Jerene Bears, MD  celecoxib (CELEBREX) 200 MG capsule Take 1 capsule (200 mg total) by mouth 2 (two) times daily. Patient not taking: Reported on 09/27/2015 08/03/15   [name], MD  diphenhydrAMINE (BENADRYL) 25 MG tablet Take 1 tablet (25 mg total) by mouth every 6 (six) hours as needed for itching or allergies. Patient not taking: Reported on 09/27/2015 08/16/15   Aleksei Plotnikov V, MD  ferrous sulfate 325 (65 FE) MG tablet Take 325 mg by mouth daily with breakfast.    Historical Provider, MD  nicotine (NICODERM CQ - DOSED IN MG/24 HOURS) 14 mg/24hr patch Place 1 patch (14 mg total) onto the skin daily. 08/30/14   Lance Bosch, NP  ondansetron (ZOFRAN) 4 MG tablet Take 1 tablet (  4 mg total) by mouth every 8 (eight) hours as needed for nausea or vomiting. Patient not taking: Reported on 09/27/2015 08/16/15   Cassandria Anger, MD  pantoprazole (PROTONIX) 40 MG tablet Take one po BID x 10 days then daily 09/14/15   Jerene Bears, MD  tiotropium (SPIRIVA HANDIHALER) 18 MCG inhalation capsule Place 1 capsule (18 mcg total) into inhaler and inhale daily. 04/13/15   Costin Karlyne Greenspan, MD   BP 142/89 mmHg  Pulse 98  Temp(Src) 98.4 F (36.9 C) (Oral)  Resp 14  Ht 5\' 7"  (1.702 m)  Wt 138 lb (62.596 kg)  BMI 21.61 kg/m2  SpO2 98% Physical Exam Physical Exam  Constitutional: Pt appears  well-developed and well-nourished. No distress.  Awake, alert, nontoxic appearance  HENT:  Head: Normocephalic and atraumatic.  Nose: bilateral nares are patent, but with friable tissue Mouth/Throat: Oropharynx is clear and moist. No oropharyngeal exudate.  Eyes: Conjunctivae are normal. No scleral icterus.  Neck: Normal range of motion. Neck supple.  Cardiovascular: Normal rate, regular rhythm and intact distal pulses.   Pulmonary/Chest: Effort normal and breath sounds normal. No respiratory distress. Pt has no wheezes.  Equal chest expansion  CTAB Abdominal: Soft. Bowel sounds are normal. Pt exhibits no mass. There is no tenderness. There is no rebound and no guarding.  Musculoskeletal: Normal range of motion. Pt exhibits no edema.  Neurological: Pt is alert.  Speech is clear and goal oriented Moves extremities without ataxia  Skin: Skin is warm and dry. Pt is not diaphoretic.  Psychiatric: Pt has a normal mood and affect.  Nursing note and vitals reviewed.   ED Course  Procedures (including critical care time) DIAGNOSTIC STUDIES: Oxygen Saturation is 98% on RA, normal by my interpretation.    COORDINATION OF CARE: 9:51 PM- Pt advised of plan for treatment and pt agrees. Pt will receive lab work for further evaluation of blood count. Pt advised to use nasal saline sprays.     Montine Circle, PA-C has personally reviewed and evaluated these lab results as part of his medical decision-making.  MDM   Final diagnoses:  Hx of epistaxis    Patient with history of anemia secondary to iron deficiency, chronic GI bleed, and thalessemia minor.  Had 2 nose bleeds today.  Will check H/H.  No bleeding at this time.  Patient with need to start nasal saline.  Bleeding likely due to weather changes, states he has been outside a lot lately.  Filed Vitals:   12/29/15 2129  BP: 142/89  Pulse: 98  Temp: 98.4 F (36.9 C)  Resp: 14    VSS.  H/H is 15.3/45.0.  Recommend close follow-up  with PCP.   I personally performed the services described in this documentation, which was scribed in my presence. The recorded information has been reviewed and is accurate.     Montine Circle, PA-C 12/29/15 [place], MD 12/29/15 [phone]

## 2015-12-29 NOTE — ED Notes (Signed)
Patient c/o two episodes of epistaxis today lasting approx 49min each and stopping spontaneously.  Patient states last episode resolved approx 1hr ago.  No active bleeding at this time. Denies taking blood thinners. No other symptoms noted.

## 2015-12-29 NOTE — Discharge Instructions (Signed)

## 2016-01-01 ENCOUNTER — Telehealth: Payer: Self-pay | Admitting: Geriatric Medicine

## 2016-01-01 NOTE — Telephone Encounter (Signed)
I called patient to ask him if he wanted me to mail some forms back to him that Dr. Sharlet Salina filled out. While on the phone he told me he had a pretty bad nosebleed. He was wondering if he needed another blood transfusion. I told him he would need an office visit to determine that, or if he felt bad he would need to go to the emergency room. He said he would call back if he wanted to schedule an office visit.

## 2016-01-09 ENCOUNTER — Other Ambulatory Visit: Payer: Medicare HMO

## 2016-01-15 ENCOUNTER — Telehealth: Payer: Self-pay | Admitting: Internal Medicine

## 2016-01-15 NOTE — Telephone Encounter (Signed)
Received ICD transition paperwork from [facility]. Per dr Sharlet Salina, patient needs appt. Called patient left message advising. Giving back to amy to hold in the meantime.

## 2016-01-30 ENCOUNTER — Ambulatory Visit: Payer: Medicare HMO | Admitting: Internal Medicine

## 2016-02-02 ENCOUNTER — Other Ambulatory Visit: Payer: Medicare HMO

## 2016-02-02 ENCOUNTER — Ambulatory Visit: Payer: Medicare HMO | Admitting: Internal Medicine

## 2016-02-02 DIAGNOSIS — K2970 Gastritis, unspecified, without bleeding: Secondary | ICD-10-CM | POA: Diagnosis not present

## 2016-02-04 LAB — H. PYLORI ANTIGEN, STOOL: H PYLORI AG STL: NEGATIVE

## 2016-02-15 ENCOUNTER — Ambulatory Visit (INDEPENDENT_AMBULATORY_CARE_PROVIDER_SITE_OTHER): Payer: Medicare HMO | Admitting: Internal Medicine

## 2016-02-15 ENCOUNTER — Other Ambulatory Visit (INDEPENDENT_AMBULATORY_CARE_PROVIDER_SITE_OTHER): Payer: Medicare HMO

## 2016-02-15 ENCOUNTER — Encounter: Payer: Self-pay | Admitting: Internal Medicine

## 2016-02-15 VITALS — BP 146/82 | HR 85 | Temp 98.1°F | Resp 14 | Ht 67.0 in | Wt 132.1 lb

## 2016-02-15 DIAGNOSIS — IMO0001 Reserved for inherently not codable concepts without codable children: Secondary | ICD-10-CM

## 2016-02-15 DIAGNOSIS — M609 Myositis, unspecified: Secondary | ICD-10-CM

## 2016-02-15 DIAGNOSIS — M1990 Unspecified osteoarthritis, unspecified site: Secondary | ICD-10-CM

## 2016-02-15 DIAGNOSIS — M791 Myalgia: Secondary | ICD-10-CM

## 2016-02-15 DIAGNOSIS — R040 Epistaxis: Secondary | ICD-10-CM

## 2016-02-15 DIAGNOSIS — Z87891 Personal history of nicotine dependence: Secondary | ICD-10-CM

## 2016-02-15 DIAGNOSIS — I10 Essential (primary) hypertension: Secondary | ICD-10-CM | POA: Diagnosis not present

## 2016-02-15 LAB — CK: CK TOTAL: 104 U/L (ref 7–232)

## 2016-02-15 LAB — RHEUMATOID FACTOR: Rhuematoid fact SerPl-aCnc: <10 IU/mL (ref <=14)

## 2016-02-15 NOTE — Patient Instructions (Signed)
We will check the labs today and call you back with the results.   Try vaseline twice a day about a pea sized amount apply to the rim of your nostrils to help moisturize the area.

## 2016-02-15 NOTE — Progress Notes (Signed)
Pre visit review using our clinic review tool, if applicable. No additional management support is needed unless otherwise documented below in the visit note. 

## 2016-02-15 NOTE — Progress Notes (Signed)
   Subjective:    Patient ID: Vincent [name], male    DOB: 10-11-1961, 55 y.o.   MRN: [id number]  HPI The patient is a 55 YO man coming in for follow up of several medical problems. He is still having some nosebleeds about 1 time per week. Sometimes minimal and others take 5 minutes to stop. Denies lightheadedness or fatigue. He denies picking but does blow his nose frequently. Has dry skin in his nose he feels.  Next concern is his pain in his feet, using wheeled walker to get around and doing better overall. Wants to know why he is having the pain. Has never really gotten this checked. Tried the celebrex but cannot remember if it helped. Overall the pain is less now. Not taking anything for it. Was working with PT and this is why he is more functional now.   Review of Systems  Constitutional: Positive for activity change. Negative for appetite change and fatigue.       Able to do ADLs  HENT: Positive for nosebleeds. Negative for dental problem, drooling, postnasal drip, rhinorrhea, sinus pressure and sore throat.   Respiratory: Negative for cough, chest tightness, shortness of breath and wheezing.        With exertion  Cardiovascular: Negative for chest pain, palpitations and leg swelling.  Gastrointestinal: Negative for nausea, abdominal pain, diarrhea, constipation and abdominal distention.  Musculoskeletal: Positive for myalgias, arthralgias and neck pain. Negative for back pain.  Skin: Negative.   Neurological: Negative.       Objective:   Physical Exam  Constitutional: He is oriented to person, place, and time. He appears well-developed and well-nourished. No distress.  HENT:  Head: Normocephalic and atraumatic.  No overt blood vessels present in the nose, dry skin and irritated appearing.   Eyes: EOM are normal.  Neck: Normal range of motion.  Cardiovascular: Normal rate and regular rhythm.   Pulmonary/Chest: Effort normal and breath sounds normal. No respiratory distress. He has  no wheezes.  Abdominal: Soft. He exhibits no distension. There is no tenderness.  Neurological: He is alert and oriented to person, place, and time. Coordination normal.  Skin: Skin is warm and dry. No rash noted.   Filed Vitals:   02/15/16 0846  BP: 146/82  Pulse: 85  Temp: 98.1 F (36.7 C)  TempSrc: Oral  Resp: 14  Height: 5\' 7"  (1.702 m)  Weight: 132 lb 1.9 oz (59.929 kg)  SpO2: 98%      Assessment & Plan:

## 2016-02-16 LAB — ANA: Anti Nuclear Antibody(ANA): NEGATIVE

## 2016-02-16 NOTE — Assessment & Plan Note (Signed)
Congratulated him on the smoking cessation and encouraged continued cessation.

## 2016-02-16 NOTE — Assessment & Plan Note (Signed)
BP at goal off meds for now but will continue to monitor.

## 2016-02-16 NOTE — Assessment & Plan Note (Signed)
No visible vessels on exam today. Offered referral to ENT but he declines today. Will use vaseline BID to help with moisture and reminded not to pick or blowing frequently.

## 2016-02-16 NOTE — Assessment & Plan Note (Signed)
He is not taking anything for this right now but previously had tried celebrex. Cannot remember if this helped or not. Never called the office back with results. Using wheeled walker today. Checking ANA, RF for autoimmune arthritis.

## 2016-03-21 ENCOUNTER — Other Ambulatory Visit: Payer: Medicare HMO

## 2016-03-21 ENCOUNTER — Telehealth: Payer: Self-pay | Admitting: Internal Medicine

## 2016-03-21 NOTE — Telephone Encounter (Signed)
returned call and s.w. pt and cx appt...he will call back to r/s

## 2016-03-28 ENCOUNTER — Ambulatory Visit: Payer: Medicare HMO | Admitting: Internal Medicine

## 2016-04-22 IMAGING — CT CT CHEST W/O CM
2 of 4 series · 15 of 36 positions shown, 18 images · non-contrast
Comparison: Chest x-ray 04/07/2015

CLINICAL DATA: Sepsis, cough, fever.  History of hypertension.

EXAM:
CT CHEST WITHOUT CONTRAST
TECHNIQUE: Multidetector CT imaging of the chest was performed following the
standard protocol without IV contrast.

[Series 2: chest w/o st · axial · non-contrast · 0.68mm/px · z∈[+932,+1218]mm · 12 of 69 slices shown, 15 images]
[im 6/69  mediastinal]
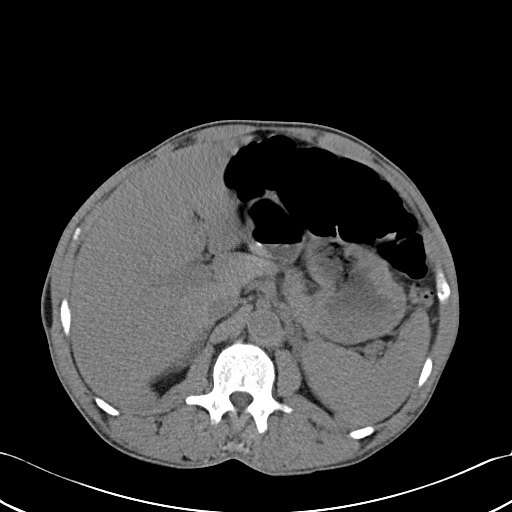
[im 6/69  lung]
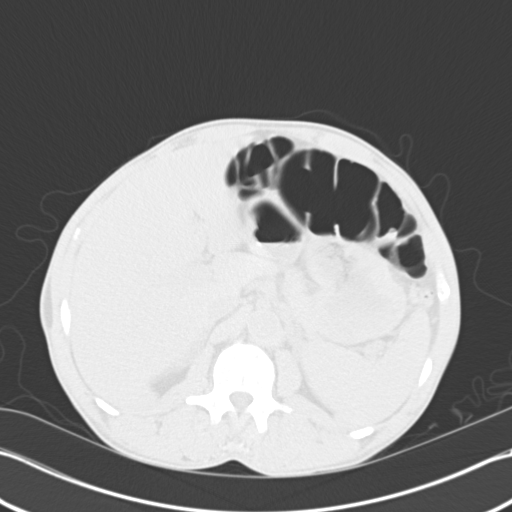
[im 11/69  lung]
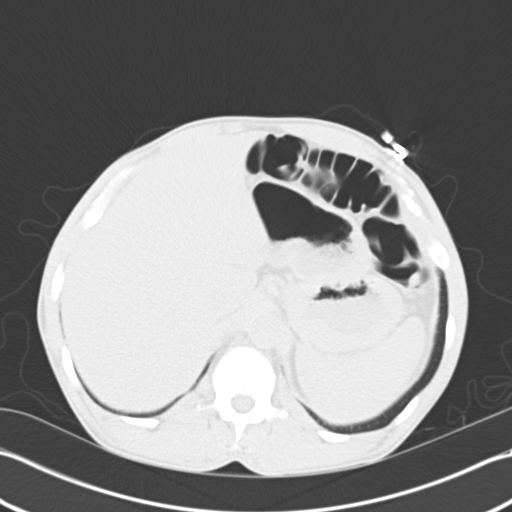
[im 16/69  lung]
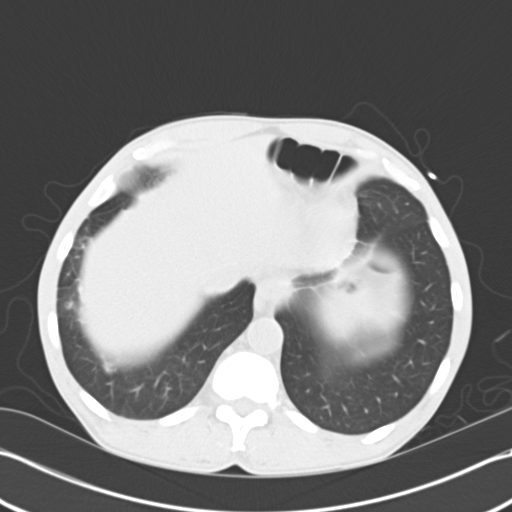
[im 21/69  lung]
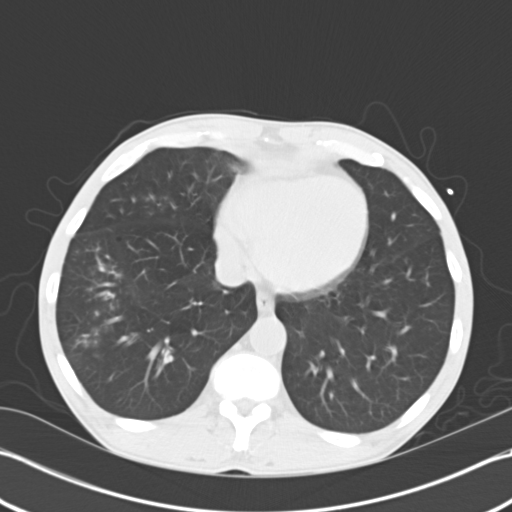
[im 27/69  mediastinal]
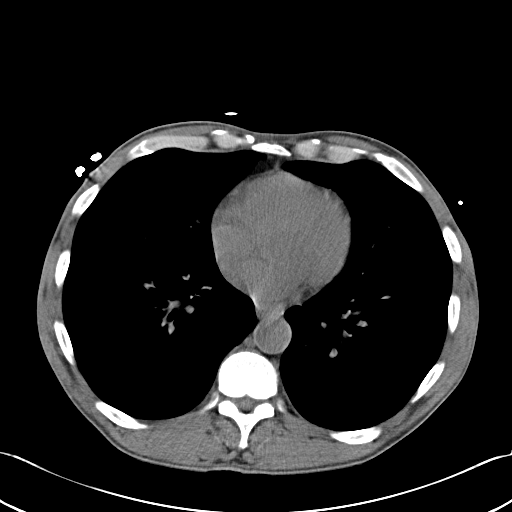
[im 27/69  lung]
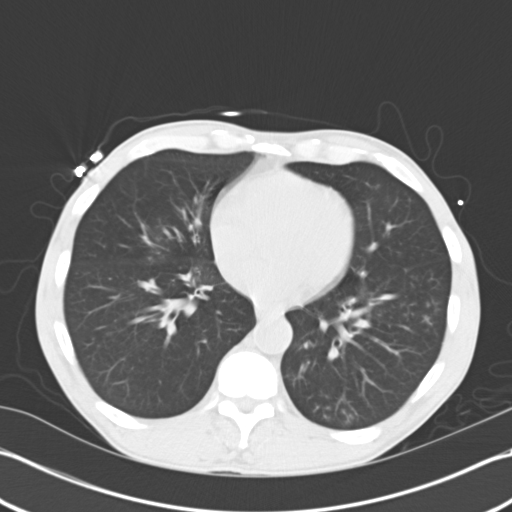
[im 32/69  lung]
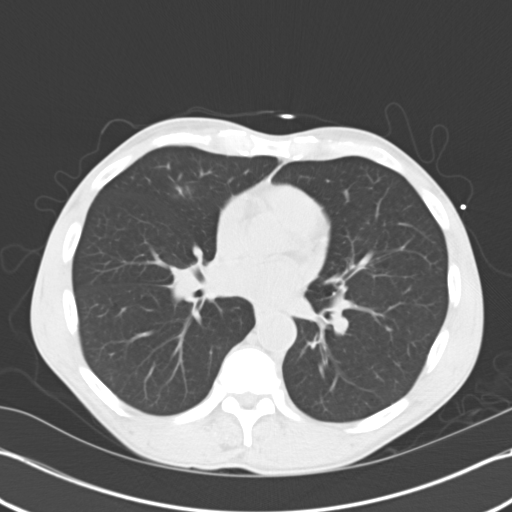
[im 37/69  lung]
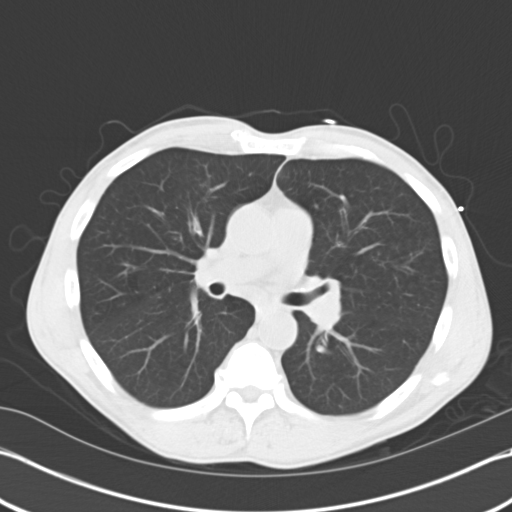
[im 42/69  lung]
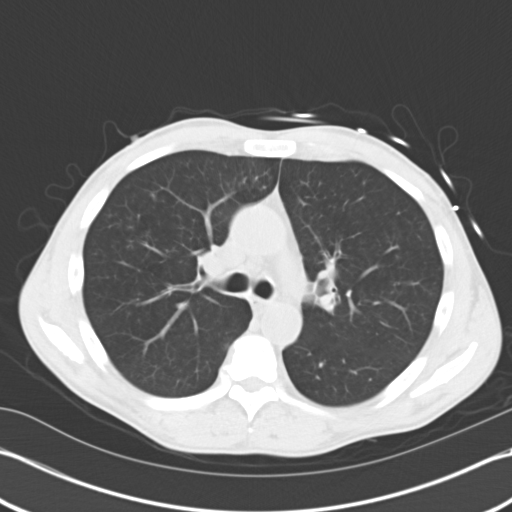
[im 48/69  mediastinal]
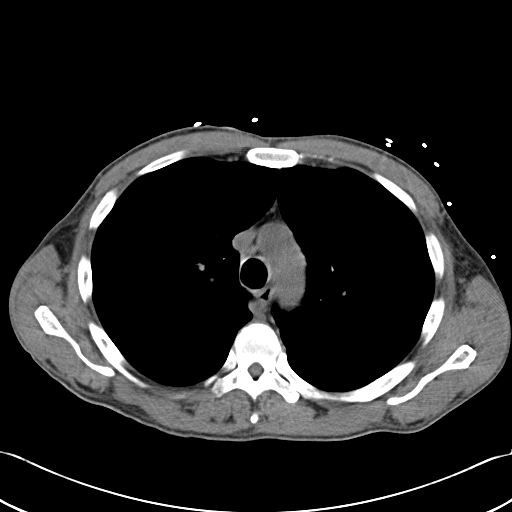
[im 48/69  lung]
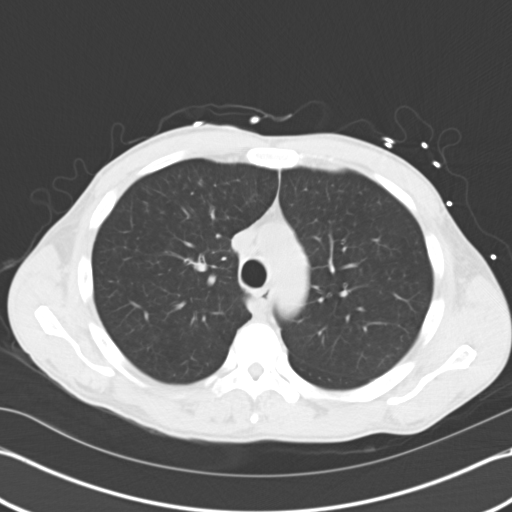
[im 53/69  lung]
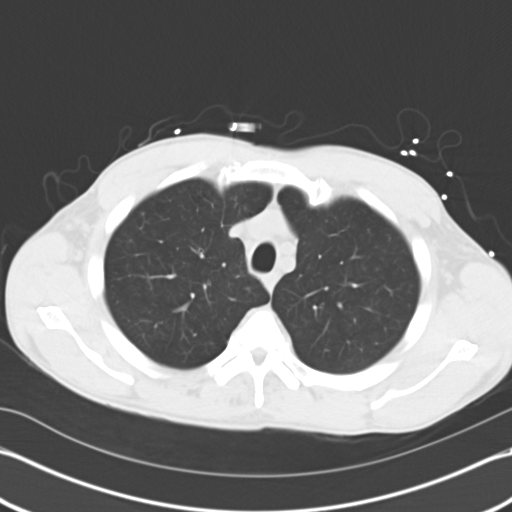
[im 58/69  lung]
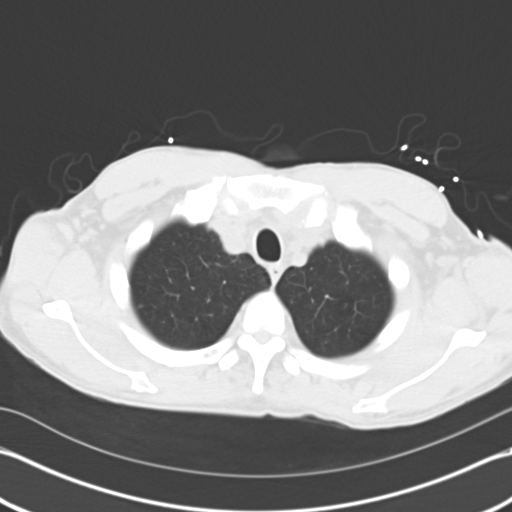
[im 63/69  lung]
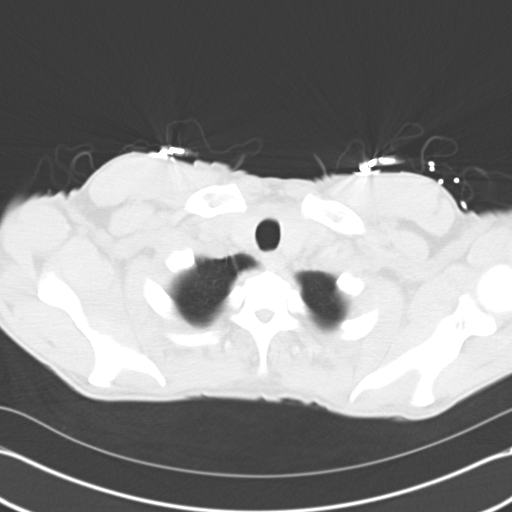

[Series 602: coronal · coronal · 0.68mm/px · 3 of 108 slices shown]
[im 22/108  lung]
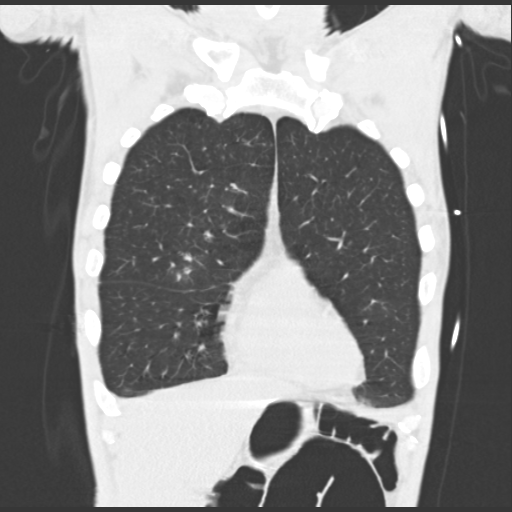
[im 43/108  lung]
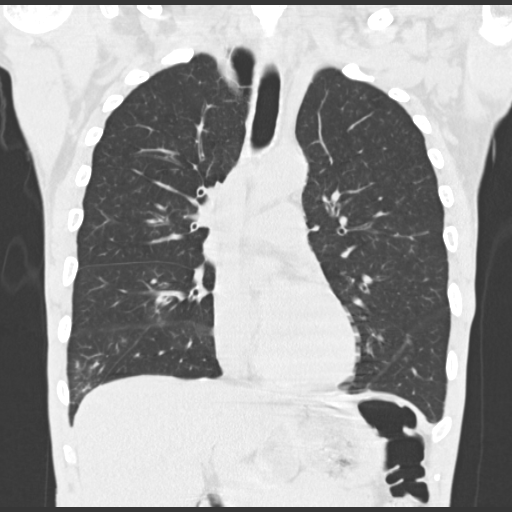
[im 65/108  lung]
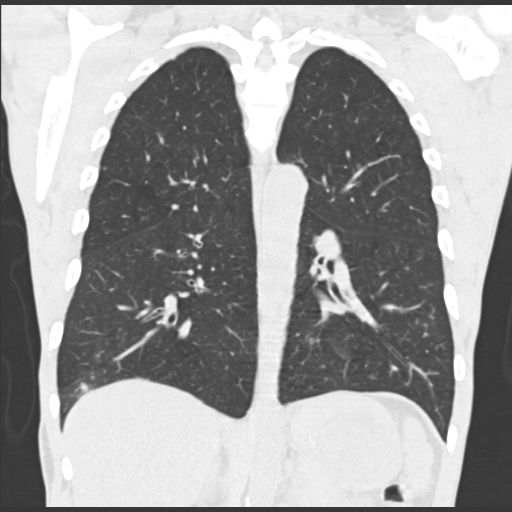

[15 of 36 positions shown; findings below may reference images not displayed]

FINDINGS: Heart: Heart size is normal. No significant coronary artery
calcification. No pericardial effusion.

Vascular structures: Normal noncontrast appearance of the aorta and
pulmonary arteries.

Mediastinum/thyroid: The visualized portion of the thyroid gland has
a normal appearance. No mediastinal, hilar, or axillary adenopathy.

Lungs/Airways: There is minimal emphysematous change scratched of
there is minimal paraseptal emphysema. There patchy areas of
interstitial infiltrate involving the right upper lobe an both lower
lobes. Findings favor infectious process. There are no pleural
effusions. No suspicious pulmonary nodules.

Upper abdomen: Unremarkable.

Chest wall/osseous structures: Negative.
IMPRESSION: 1. Patchy interstitial infiltrates most likely related to multi
focal pneumonia.
2. No significant adenopathy.

## 2016-04-22 IMAGING — CR DG CHEST 2V
3 series · 3 of 3 positions shown · non-contrast
Comparison: December 15, 2012.

CLINICAL DATA: Cough, shortness of breath.

EXAM:
CHEST  2 VIEW

[view not recorded (1 of 3)]
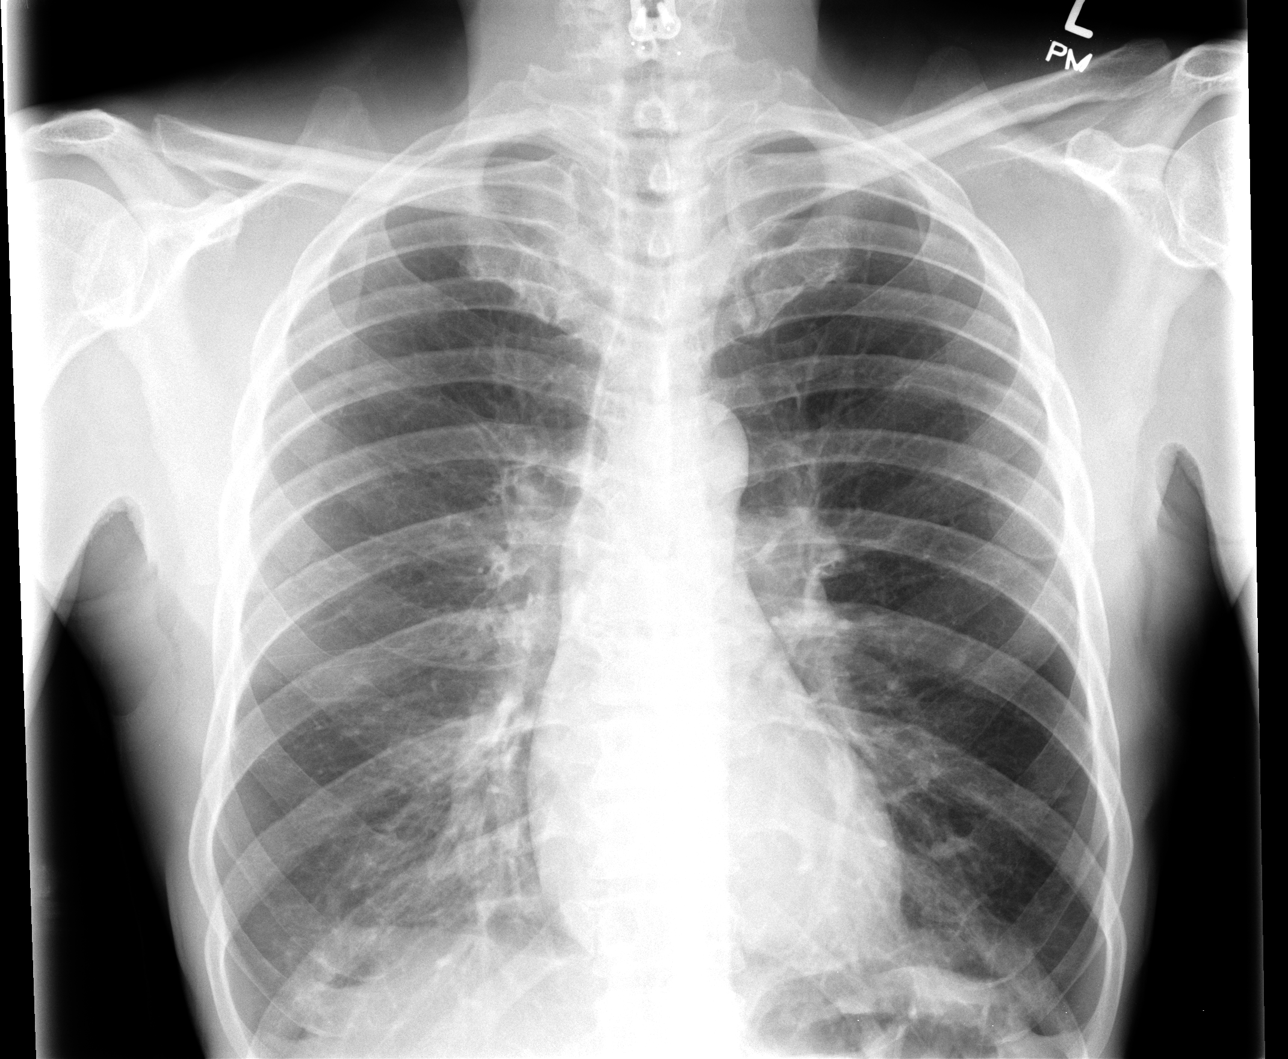

[view not recorded (2 of 3)]
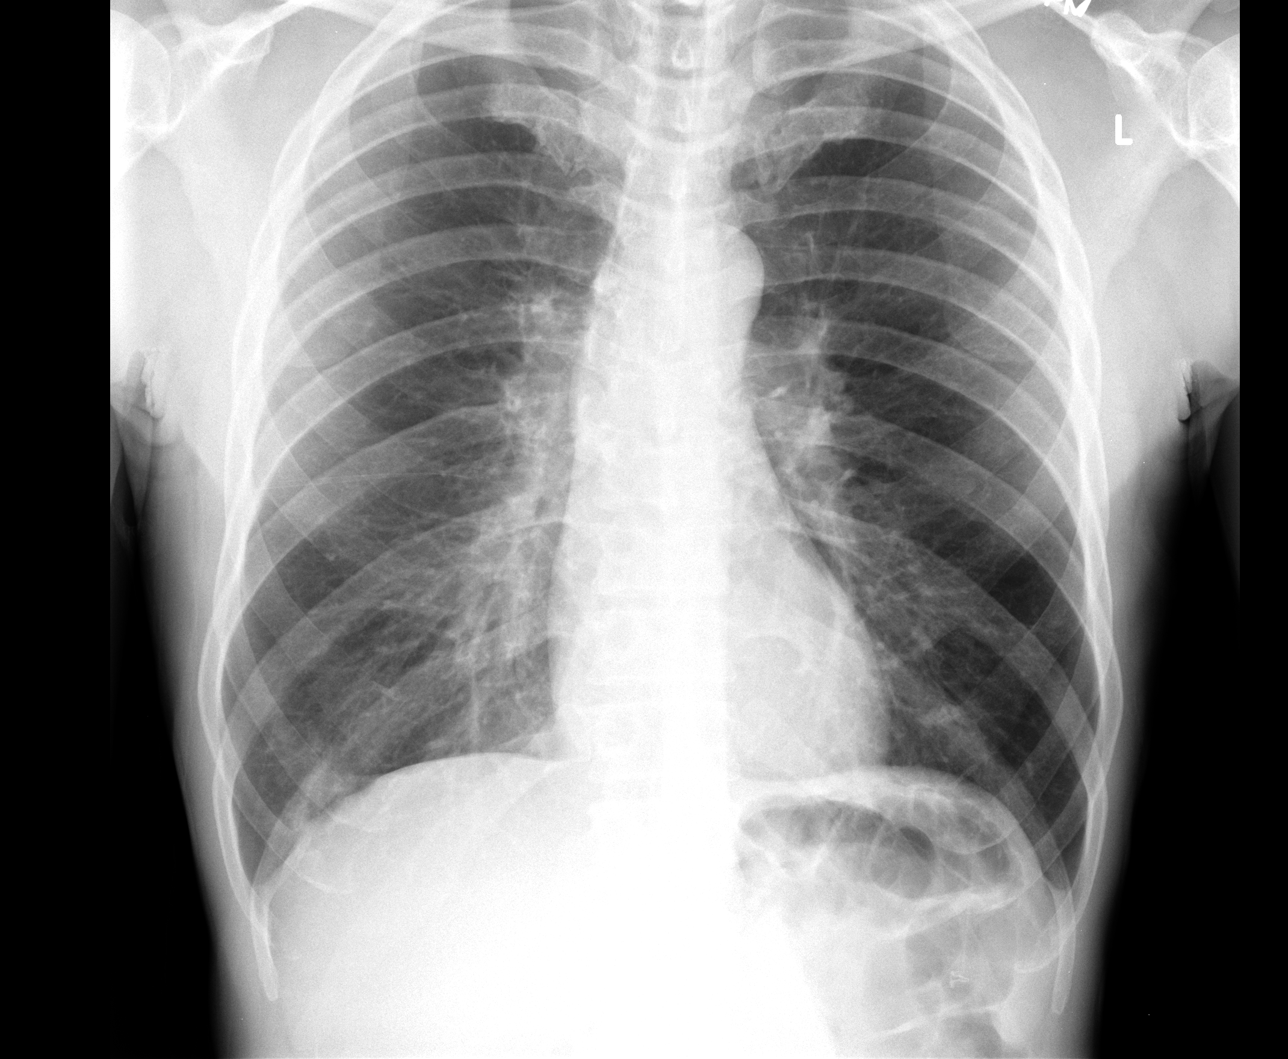

[view not recorded (3 of 3)]
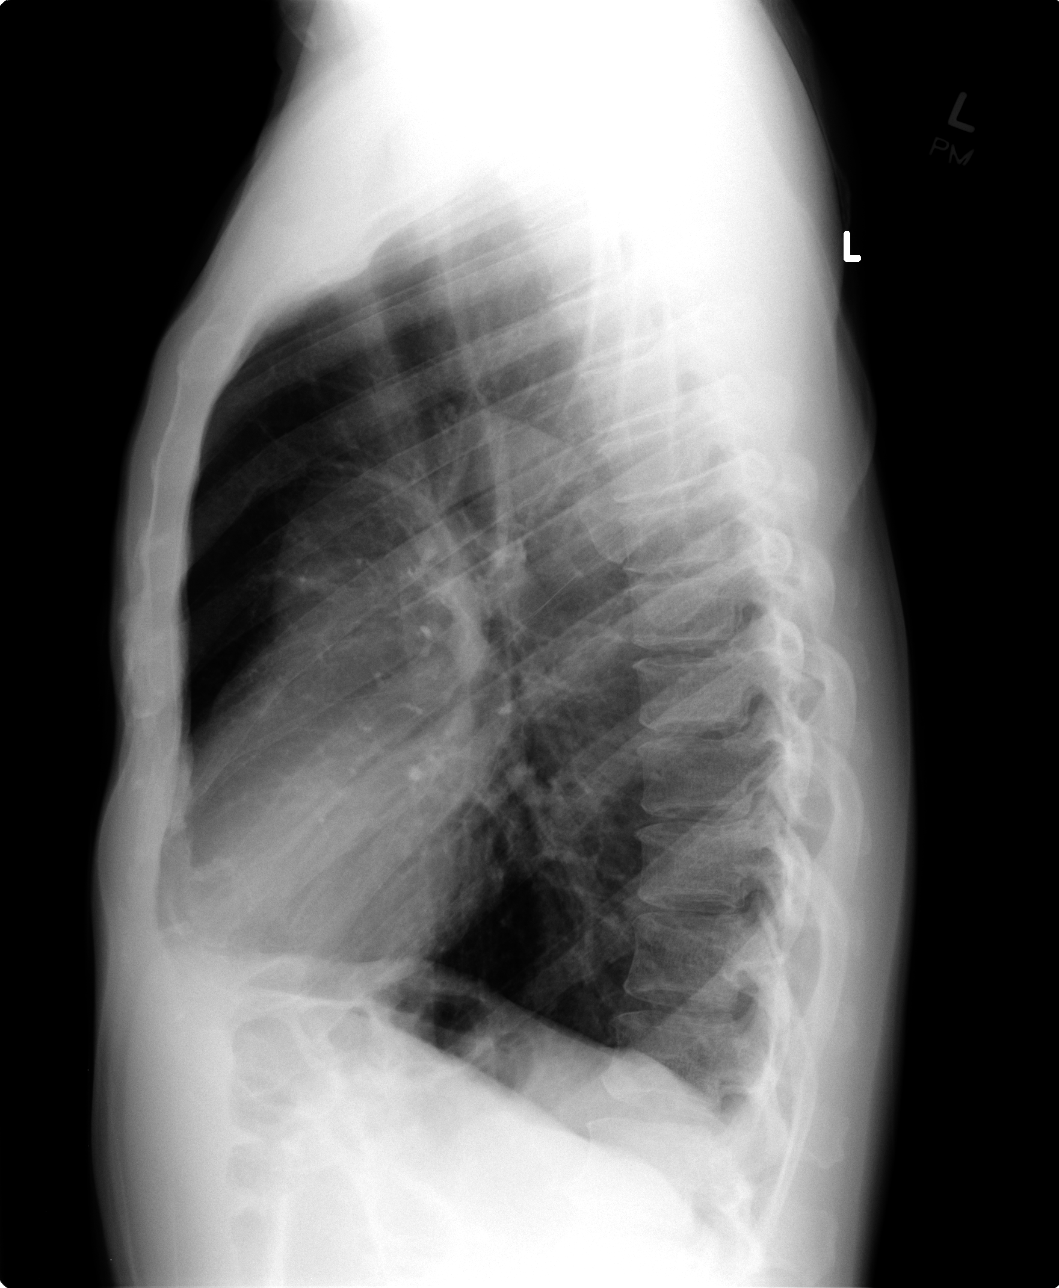

[3 of 3 positions shown; findings below may reference images not displayed]

FINDINGS: The heart size and mediastinal contours are within normal limits.
Both lungs are clear. No pneumothorax or pleural effusion is noted.
Hyperexpansion of the lungs is noted suggesting chronic obstructive
pulmonary disease. The visualized skeletal structures are
unremarkable.
IMPRESSION: Findings concerning for chronic obstructive pulmonary disease. No
acute cardiopulmonary abnormality seen.

## 2016-04-24 IMAGING — CR DG CHEST 2V
2 series · 2 of 2 positions shown · non-contrast
Comparison: 04/07/2015

CLINICAL DATA: Progressive shortness of breath. Productive cough
fever and chills. Followup pneumonia.

EXAM:
CHEST  2 VIEW

[w chest pa]
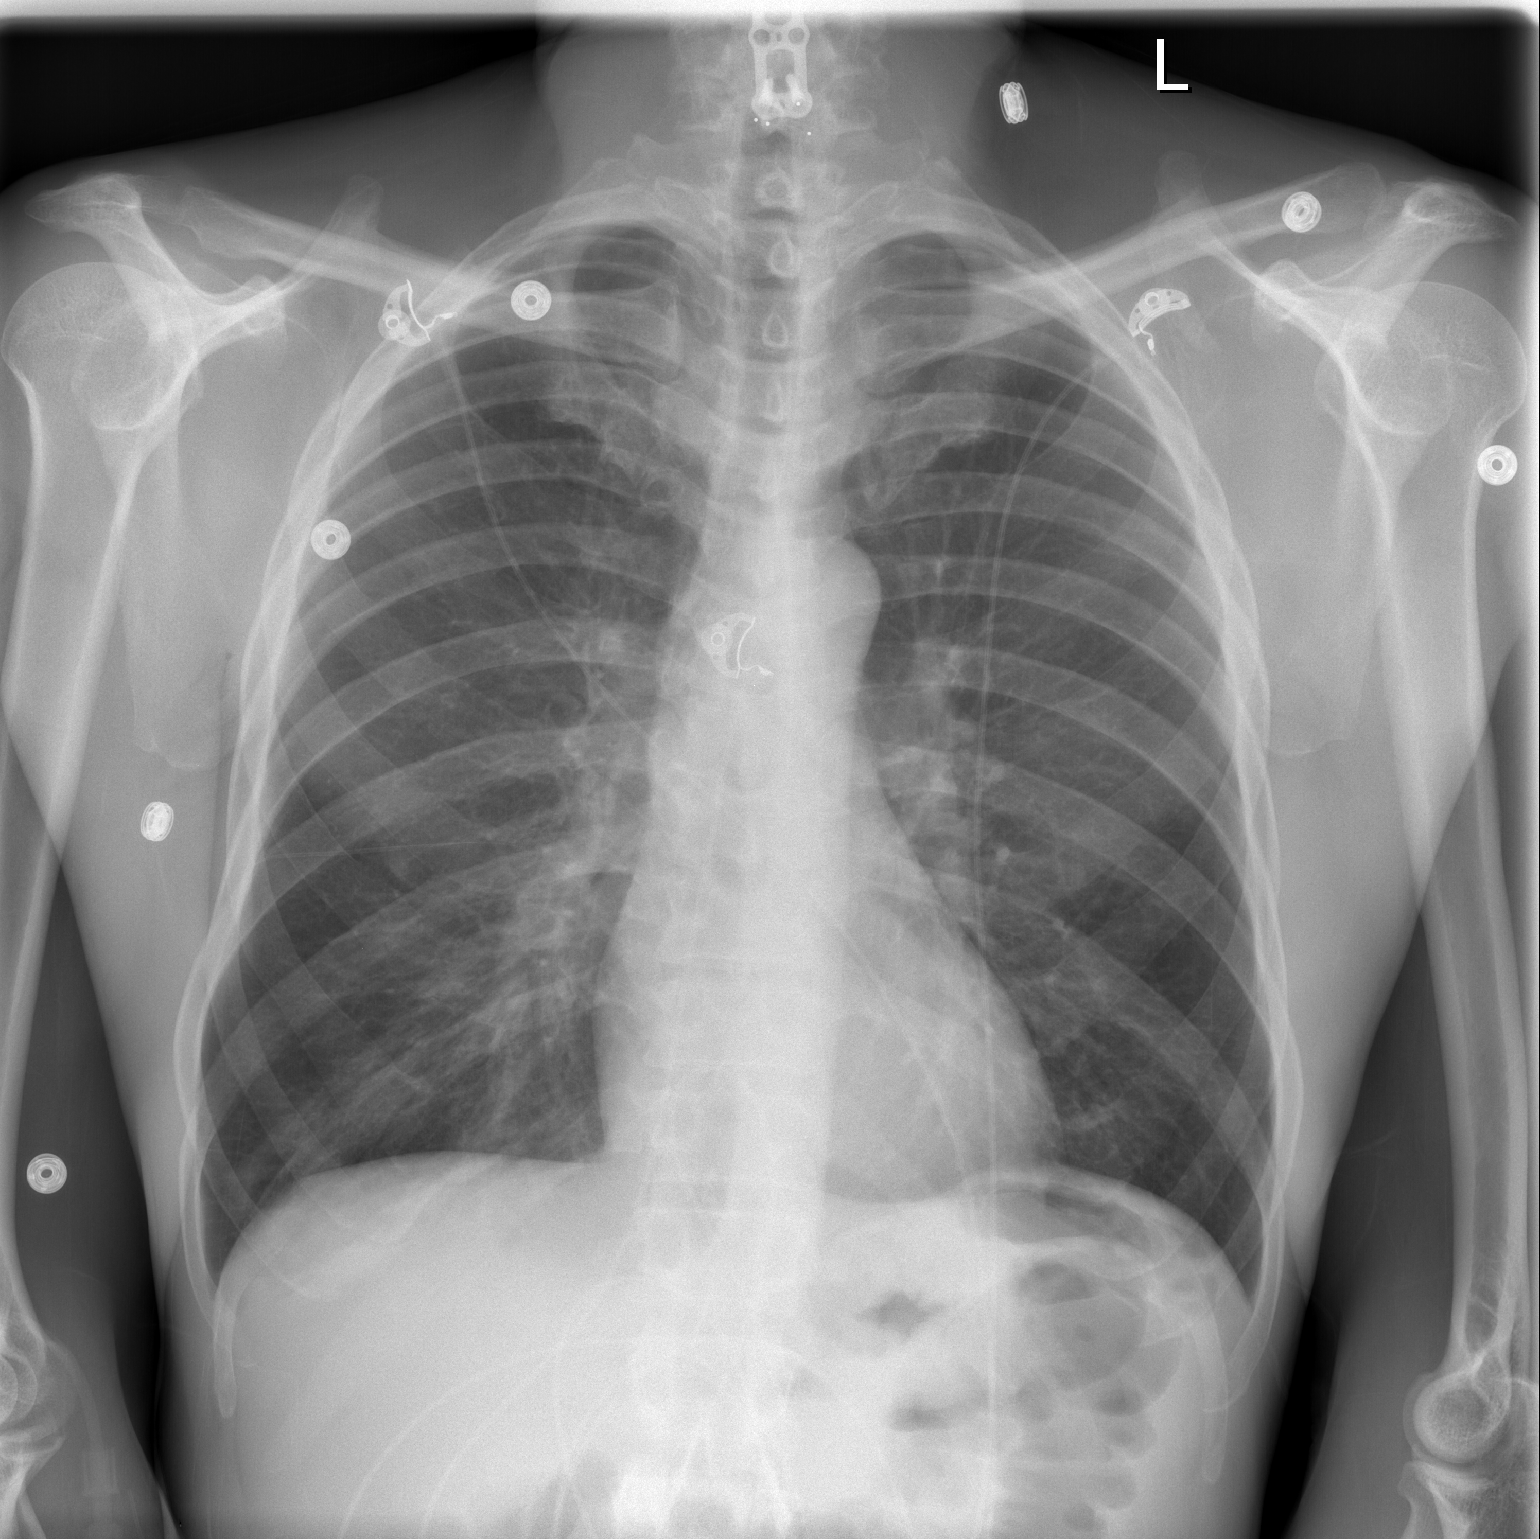

[w chest lat]
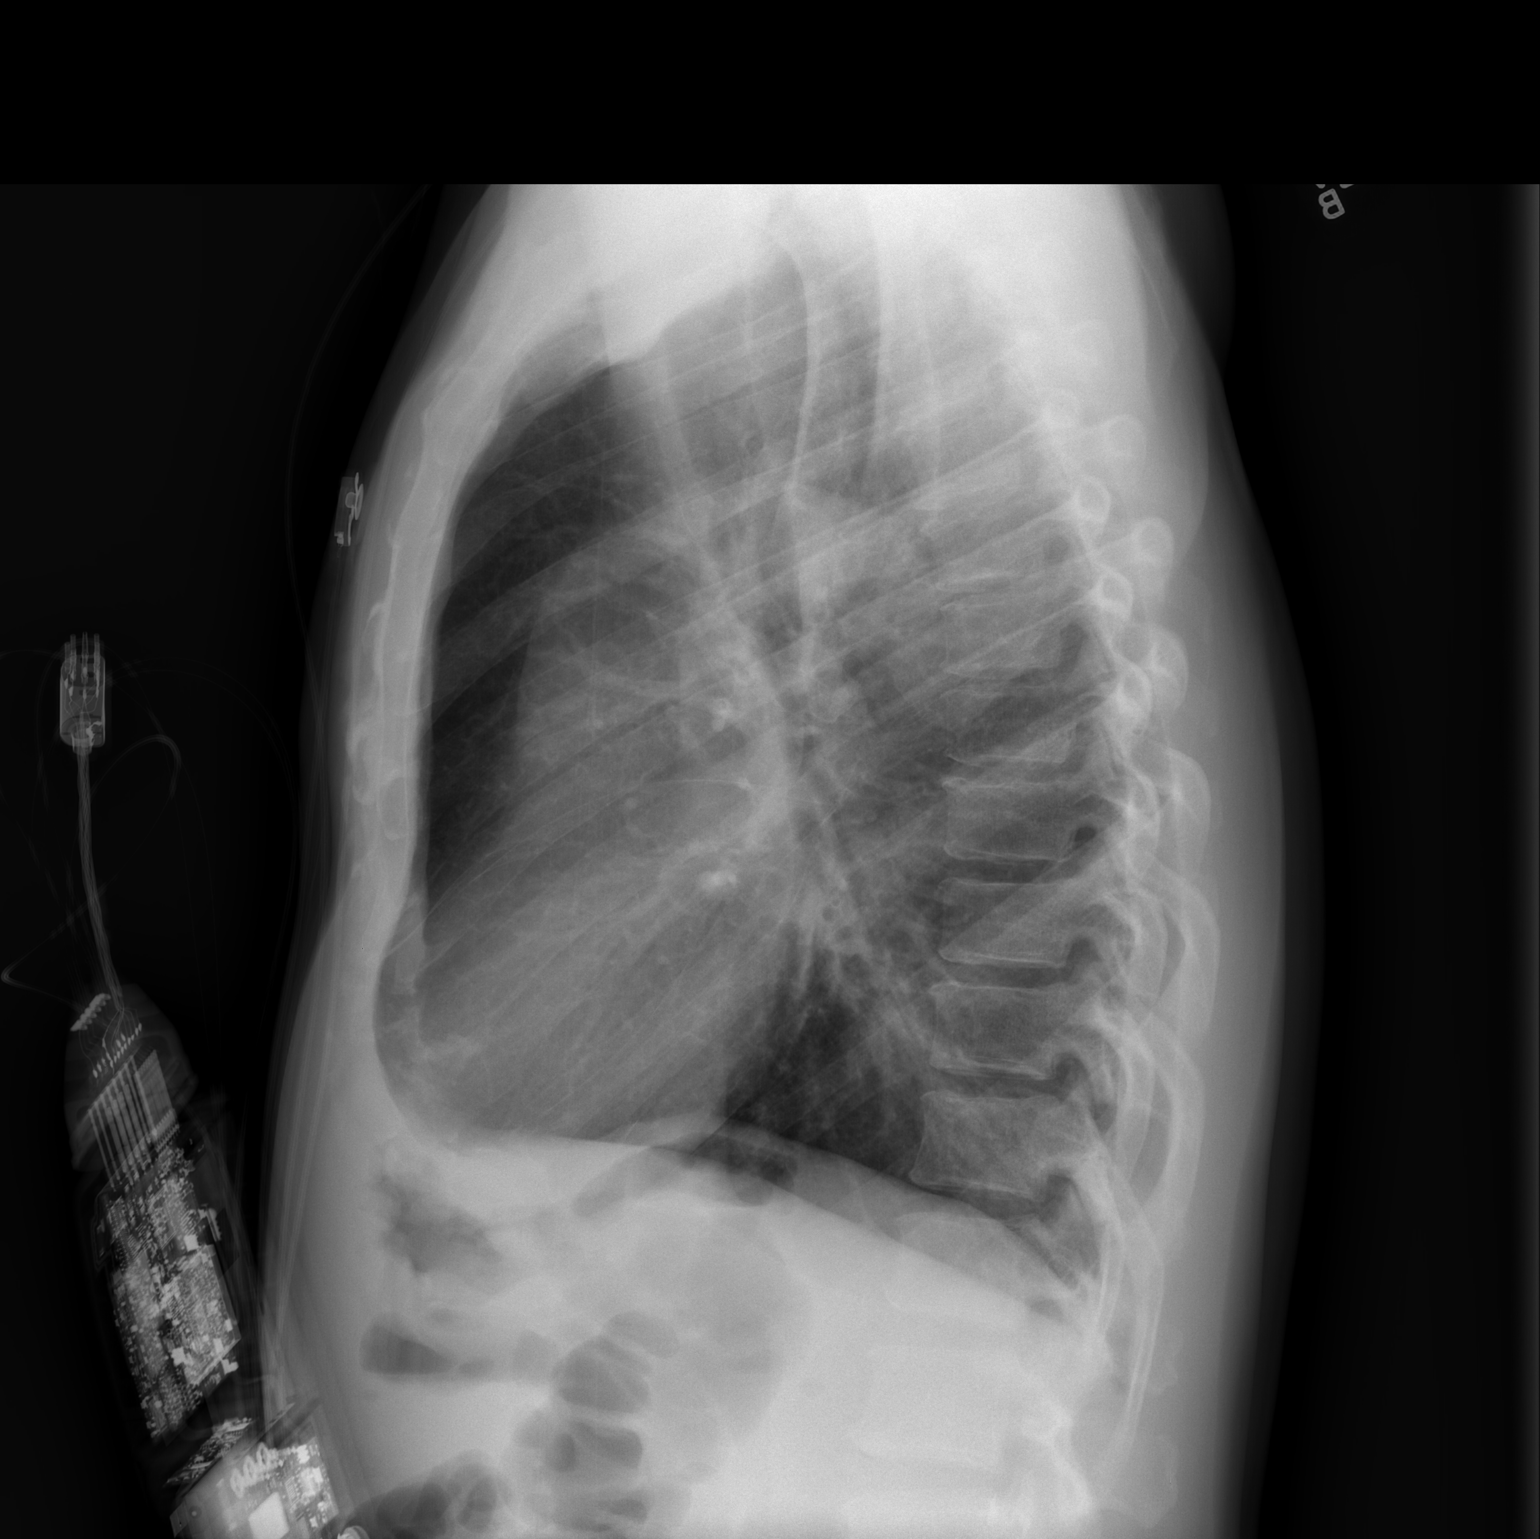

[2 of 2 positions shown; findings below may reference images not displayed]

FINDINGS: Pulmonary hyperinflation again seen, consistent with COPD.
Previously seen bilateral lower lobe infiltrates are decreased since
previous study. No evidence of pleural effusion. No mass or
lymphadenopathy identified. Heart size remains normal.
IMPRESSION: Resolving bilateral lower lobe infiltrates since previous study.

COPD.

## 2016-05-09 ENCOUNTER — Encounter: Payer: Self-pay | Admitting: Internal Medicine

## 2016-05-09 ENCOUNTER — Ambulatory Visit (INDEPENDENT_AMBULATORY_CARE_PROVIDER_SITE_OTHER): Payer: Medicare HMO | Admitting: Internal Medicine

## 2016-05-09 VITALS — BP 138/82 | HR 102 | Temp 98.2°F | Resp 20 | Wt 136.0 lb

## 2016-05-09 DIAGNOSIS — J449 Chronic obstructive pulmonary disease, unspecified: Secondary | ICD-10-CM | POA: Diagnosis not present

## 2016-05-09 DIAGNOSIS — G43909 Migraine, unspecified, not intractable, without status migrainosus: Secondary | ICD-10-CM | POA: Insufficient documentation

## 2016-05-09 DIAGNOSIS — R519 Headache, unspecified: Secondary | ICD-10-CM

## 2016-05-09 DIAGNOSIS — I10 Essential (primary) hypertension: Secondary | ICD-10-CM

## 2016-05-09 DIAGNOSIS — R51 Headache: Secondary | ICD-10-CM | POA: Diagnosis not present

## 2016-05-09 DIAGNOSIS — M79671 Pain in right foot: Secondary | ICD-10-CM | POA: Diagnosis not present

## 2016-05-09 DIAGNOSIS — M79672 Pain in left foot: Secondary | ICD-10-CM

## 2016-05-09 MED ORDER — TOPIRAMATE 50 MG PO TABS: 50.0000 mg | ORAL_TABLET | Freq: Two times a day (BID) | ORAL | Status: DC

## 2016-05-09 MED ORDER — SUMATRIPTAN SUCCINATE 100 MG PO TABS: 100.0000 mg | ORAL_TABLET | ORAL | Status: DC | PRN

## 2016-05-09 NOTE — Assessment & Plan Note (Signed)
stable overall by history and exam, recent data reviewed with pt, and pt to continue medical treatment as before,  to f/u any worsening symptoms or concerns BP Readings from Last 3 Encounters:  05/09/16 138/82  02/15/16 146/82  12/29/15 142/89

## 2016-05-09 NOTE — Progress Notes (Signed)
Pre visit review using our clinic review tool, if applicable. No additional management support is needed unless otherwise documented below in the visit note. 

## 2016-05-09 NOTE — Patient Instructions (Signed)
Please take all new medication as prescribed - the imitrex and the topamax  Please continue all other medications as before, and refills have been done if requested.  Please have the pharmacy call with any other refills you may need.  Please continue your efforts at being more active, low cholesterol diet, and weight control.  You are otherwise up to date with prevention measures today.  Please keep your appointments with your specialists as you may have planned  You will be contacted regarding the referral for: podiatry  Please see Dr Sharlet Salina in 2-3 weeks if not improved

## 2016-05-09 NOTE — Assessment & Plan Note (Signed)
Etiology unclear, for podiatry referral, consider orthopedic or neuro referral for gait d/o or unclear etiology

## 2016-05-09 NOTE — Assessment & Plan Note (Signed)
soomewhat atypical for likely migraine primarily, for imitrex prn, topamax preventive, and f/u with PCP, consider neurology referraal

## 2016-05-09 NOTE — Assessment & Plan Note (Signed)
stable overall by history and exam, recent data reviewed with pt, and pt to continue medical treatment as before,  to f/u any worsening symptoms or concerns SpO2 Readings from Last 3 Encounters:  05/09/16 97%  02/15/16 98%  12/29/15 98%

## 2016-05-09 NOTE — Progress Notes (Signed)
Subjective:    Patient ID: Vincent [name], male    DOB: 1960/12/17, 55 y.o.   MRN: [id number]  HPI    55yo AAM with hx of migraine here with 3 wks onset daily mild to mod HA's, starting occipital bilat with radiation forward and bilat to the temples and eyes; with throbbing, nausea, occ left eye blurriness (right eye with glaucoma with chronic blurriness), + photophobia and phonophobia sometimes, helped for a couple of hours with ibuprofen, worse with activity.  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  Has no hx of LE symptoms assoc with c-spine dz, denies LBP but has bilat feet pain which causes difficulty ambulating, now walking with walker but states has not been evaluated for feet pain.   Pt denies fever, wt loss, night sweats, loss of appetite, or other constitutional symptoms Pt denies chest pain, increased sob or doe, wheezing, orthopnea, PND, increased LE swelling, palpitations, dizziness or syncope.  Pt denies new neurological symptoms such as new  facial or extremity weakness or numbness Past Medical History  Diagnosis Date  . Glaucoma   . Hypertension   . Tobacco use disorder 04/10/2015  . Anemia   . Severe protein-calorie malnutrition ([place])   . CAP (community acquired pneumonia)   . COPD (chronic obstructive pulmonary disease) ([place])   . Blood transfusion without reported diagnosis   . Essential hypertension 07/05/2013    Chronic    . Iron deficiency anemia due to chronic blood loss 08/02/2015     9/16 The patient had an iron infusion (Ferumoxytol) this morning and left about a half hour later and was fine. Now he is home and he is breaking out in sweat, nauseated and dizzy when standing. His BP is 160/86 and HR is between 90-96". It was his second and last iron infusion.  Likely side effects w/Ferumoxytol IV.    Marland Kitchen Thalassemia minor 08/02/2015   Past Surgical History  Procedure Laterality Date  . Plate in neck    . Right  shoulder arthroscopy      reports that he quit smoking about 14 months ago. His smoking use included Cigarettes. He has a 20 pack-year smoking history. He has never used smokeless tobacco. He reports that he does not drink alcohol or use illicit drugs. family history includes Diabetes in his sister; Heart disease in his father; Sickle cell anemia in his mother. No Known Allergies Current Outpatient Prescriptions on File Prior to Visit  Medication Sig Dispense Refill  . albuterol (PROVENTIL HFA;VENTOLIN HFA) 108 (90 BASE) MCG/ACT inhaler Inhale 2 puffs into the lungs every 6 (six) hours as needed for wheezing or shortness of breath. 1 Inhaler 2  . celecoxib (CELEBREX) 200 MG capsule Take 1 capsule (200 mg total) by mouth 2 (two) times daily. 60 capsule 2  . ferrous sulfate 325 (65 FE) MG tablet Take 325 mg by mouth daily with breakfast.    . tiotropium (SPIRIVA HANDIHALER) 18 MCG inhalation capsule Place 1 capsule (18 mcg total) into inhaler and inhale daily. 30 capsule 12   No current facility-administered medications on file prior to visit.    Review of Systems  Constitutional: Negative for unusual diaphoresis or night sweats HENT: Negative for ear swelling or discharge Eyes: Negative for worsening visual haziness  Respiratory: Negative for choking and stridor.   Gastrointestinal: Negative for distension or worsening eructation Genitourinary: Negative for retention or change in urine volume.  Musculoskeletal: Negative  for other MSK pain or swelling Skin: Negative for color change and worsening wound Neurological: Negative for tremors and numbness other than noted  Psychiatric/Behavioral: Negative for decreased concentration or agitation other than above       Objective:   Physical Exam BP 138/82 mmHg  Pulse 102  Temp(Src) 98.2 F (36.8 C) (Oral)  Resp 20  Wt 136 lb (61.689 kg)  SpO2 97% VS noted, walks with rollater, limps with feet pain somewhat Constitutional: Pt appears in  no apparent distress HENT: Head: NCAT.  Right Ear: External ear normal.  Left Ear: External ear normal.  Eyes: . Pupils are equal, round, and reactive to light. Conjunctivae and EOM are normal Neck: Normal range of motion. Neck supple.  Cardiovascular: Normal rate and regular rhythm.   Pulmonary/Chest: Effort normal and breath sounds without rales or wheezing.  Abd:  Soft, NT, ND, + BS Thoracic and lumbar spine nontender C-spine with mild lower c-spine tender midline without rash, swelling or erythema Neurological: Pt is alert. Not confused , motor 5/5 intact, cn 2-12 intact Skin: Skin is warm. No rash, no LE edema Psychiatric: Pt behavior is normal. No agitation.     Assessment & Plan:

## 2016-06-06 ENCOUNTER — Ambulatory Visit (INDEPENDENT_AMBULATORY_CARE_PROVIDER_SITE_OTHER): Payer: Medicare HMO | Admitting: Podiatry

## 2016-06-06 ENCOUNTER — Ambulatory Visit (INDEPENDENT_AMBULATORY_CARE_PROVIDER_SITE_OTHER): Payer: Medicare HMO

## 2016-06-06 ENCOUNTER — Encounter: Payer: Self-pay | Admitting: Podiatry

## 2016-06-06 VITALS — BP 153/88 | HR 77 | Resp 16

## 2016-06-06 DIAGNOSIS — M205X1 Other deformities of toe(s) (acquired), right foot: Secondary | ICD-10-CM | POA: Diagnosis not present

## 2016-06-06 DIAGNOSIS — M79675 Pain in left toe(s): Secondary | ICD-10-CM

## 2016-06-06 DIAGNOSIS — M79674 Pain in right toe(s): Secondary | ICD-10-CM

## 2016-06-06 DIAGNOSIS — M779 Enthesopathy, unspecified: Secondary | ICD-10-CM

## 2016-06-06 MED ORDER — PREDNISONE 10 MG PO TABS: ORAL_TABLET | ORAL | Status: DC

## 2016-06-06 NOTE — Progress Notes (Signed)
   Subjective:    Patient ID: Vincent [name], male    DOB: [birth year], 55 y.o.   MRN: [id number]  HPI    Review of Systems  Constitutional: Positive for unexpected weight change.  Musculoskeletal: Positive for gait problem.  Neurological: Positive for headaches.  All other systems reviewed and are negative.      Objective:   Physical Exam        Assessment & Plan:

## 2016-06-06 NOTE — Progress Notes (Signed)
Subjective:     Patient ID: Vincent [name], male   DOB: 1961-02-04, 55 y.o.   MRN: [id number]  HPI patient presents in relatively poor health with discomfort around the joints of both feet of one-month duration with no history as to why it occurred   Review of Systems  All other systems reviewed and are negative.      Objective:   Physical Exam  Constitutional: He is oriented to person, place, and time.  Cardiovascular: Intact distal pulses.   Musculoskeletal: Normal range of motion.  Neurological: He is oriented to person, place, and time.  Skin: Skin is warm and dry.  Nursing note and vitals reviewed.  neurovascular status was found to be intact muscle strength was unchanged with patient noted to have discomfort in the lesser MPJs of both feet with fluid buildup and pain around the first MPJ right where there is obvious arthritis and spur formation. 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 I've recommended a 12 day Sterapred dose pack which was prescribed but I do want him to get this cleared with his doctor who treats low white blood count. I went ahead and we'll reevaluate in 3 weeks or earlier if needed and did give him instructions to see his doctor

## 2016-06-07 ENCOUNTER — Telehealth: Payer: Self-pay | Admitting: Internal Medicine

## 2016-06-07 NOTE — Telephone Encounter (Signed)
returned call and r/s cx appt.Marland KitchenMarland KitchenMarland KitchenMarland Kitchenpt ok and aware of new d.t

## 2016-06-18 ENCOUNTER — Encounter: Payer: Self-pay | Admitting: Internal Medicine

## 2016-06-18 ENCOUNTER — Ambulatory Visit (INDEPENDENT_AMBULATORY_CARE_PROVIDER_SITE_OTHER): Payer: Medicare HMO | Admitting: Internal Medicine

## 2016-06-18 ENCOUNTER — Other Ambulatory Visit (INDEPENDENT_AMBULATORY_CARE_PROVIDER_SITE_OTHER): Payer: Medicare HMO

## 2016-06-18 VITALS — BP 144/70 | HR 102 | Temp 98.4°F | Resp 14 | Wt 128.0 lb

## 2016-06-18 DIAGNOSIS — M79671 Pain in right foot: Secondary | ICD-10-CM

## 2016-06-18 DIAGNOSIS — J441 Chronic obstructive pulmonary disease with (acute) exacerbation: Secondary | ICD-10-CM

## 2016-06-18 DIAGNOSIS — G43811 Other migraine, intractable, with status migrainosus: Secondary | ICD-10-CM | POA: Diagnosis not present

## 2016-06-18 DIAGNOSIS — R51 Headache: Secondary | ICD-10-CM | POA: Diagnosis not present

## 2016-06-18 DIAGNOSIS — D62 Acute posthemorrhagic anemia: Secondary | ICD-10-CM

## 2016-06-18 DIAGNOSIS — I10 Essential (primary) hypertension: Secondary | ICD-10-CM

## 2016-06-18 DIAGNOSIS — R519 Headache, unspecified: Secondary | ICD-10-CM

## 2016-06-18 DIAGNOSIS — M79672 Pain in left foot: Secondary | ICD-10-CM

## 2016-06-18 LAB — COMPREHENSIVE METABOLIC PANEL
ALK PHOS: 110 U/L (ref 39–117)
ALT: 78 U/L — AB (ref 0–53)
AST: 161 U/L — AB (ref 0–37)
Albumin: 4.2 g/dL (ref 3.5–5.2)
BILIRUBIN TOTAL: 0.7 mg/dL (ref 0.2–1.2)
BUN: 4 mg/dL — ABNORMAL LOW (ref 6–23)
CO2: 22 meq/L (ref 19–32)
CREATININE: 0.93 mg/dL (ref 0.40–1.50)
Calcium: 9.5 mg/dL (ref 8.4–10.5)
Chloride: 104 mEq/L (ref 96–112)
GFR: 108.5 mL/min (ref >60.00)
GLUCOSE: 84 mg/dL (ref 70–99)
Potassium: 4 mEq/L (ref 3.5–5.1)
Sodium: 135 mEq/L (ref 135–145)
TOTAL PROTEIN: 8.1 g/dL (ref 6.0–8.3)

## 2016-06-18 LAB — CBC
HCT: 38.5 % — ABNORMAL LOW (ref 39.0–52.0)
Hemoglobin: 12.4 g/dL — ABNORMAL LOW (ref 13.0–17.0)
MCHC: 32.2 g/dL (ref 30.0–36.0)
MCV: 76.3 fl — AB (ref 78.0–100.0)
Platelets: 56 10*3/uL — ABNORMAL LOW (ref 150.0–400.0)
RBC: 5.04 Mil/uL (ref 4.22–5.81)
RDW: 16.1 % — AB (ref 11.5–15.5)
WBC: 3.3 10*3/uL — ABNORMAL LOW (ref 4.0–10.5)

## 2016-06-18 LAB — SEDIMENTATION RATE: SED RATE: 35 mm/h — AB (ref 0–20)

## 2016-06-18 LAB — T4, FREE: FREE T4: 0.82 ng/dL (ref 0.60–1.60)

## 2016-06-18 LAB — TSH: TSH: 0.86 u[IU]/mL (ref 0.35–4.50)

## 2016-06-18 MED ORDER — CYCLOBENZAPRINE HCL 5 MG PO TABS: 5.0000 mg | ORAL_TABLET | Freq: Three times a day (TID) | ORAL | Status: DC | PRN

## 2016-06-18 MED ORDER — ATENOLOL 25 MG PO TABS: 25.0000 mg | ORAL_TABLET | Freq: Every day | ORAL | Status: DC

## 2016-06-18 NOTE — Assessment & Plan Note (Signed)
BP mildly elevated today and starting atenolol for headache prevention which will help BP as well.

## 2016-06-18 NOTE — Assessment & Plan Note (Signed)
Advised him to start the prednisone as directed by his podiatrist and cleared to use. This will also help his lungs. Some wheezing on exam which partially clears with cough. No indication for antibiotics without change in sputum.

## 2016-06-18 NOTE — Progress Notes (Signed)
   Subjective:    Patient ID: Vincent [name], male    DOB: [birth year], 55 y.o.   MRN: [id number]  HPI The patient is a 55 YO man coming in for follow up of headaches. Started about 2 months ago and daily. Previously no headaches. He was started on topamax and sumatriptan at last visit with another provider. He did not like the way they made him feel and stopped both since he was not sure which one caused the symptoms. He is taking tylenol daily to try to help and it does not help much. Typically the headaches are in the evening and can wake him up at night and still present next day. Starts in his neck and goes into the head both sides. No tenderness at the temples. No fevers or chills. No weight change.  In addition his breathing is somewhat worse recently. Still using spiriva daily and having to use the albuterol a little more recently. No fevers or chills. No new cough. Some more SOB with activity.   Review of Systems  Constitutional: Negative for activity change, appetite change and fatigue.  HENT: Negative for dental problem, drooling, postnasal drip, rhinorrhea, sinus pressure and sore throat.   Respiratory: Positive for cough and shortness of breath. Negative for chest tightness and wheezing.        With exertion  Cardiovascular: Negative for chest pain, palpitations and leg swelling.  Gastrointestinal: Negative for nausea, abdominal pain, diarrhea, constipation and abdominal distention.  Musculoskeletal: Positive for myalgias, arthralgias and neck pain. Negative for back pain.  Skin: Negative.   Neurological: Positive for weakness and headaches. Negative for dizziness, light-headedness and numbness.      Objective:   Physical Exam  Constitutional: He is oriented to person, place, and time. He appears well-developed and well-nourished. No distress.  HENT:  Head: Normocephalic and atraumatic.  No temporal tenderness to palpation.  Eyes: EOM are normal.  Neck: Normal range of motion.    Cardiovascular: Normal rate and regular rhythm.   Pulmonary/Chest: Effort normal. No respiratory distress. He has wheezes.  Mild expiratory wheezing bilaterally.   Abdominal: Soft. He exhibits no distension. There is no tenderness.  Neurological: He is alert and oriented to person, place, and time. Coordination normal.  Skin: Skin is warm and dry. No rash noted.   Filed Vitals:   06/18/16 0806  BP: 144/70  Pulse: 102  Temp: 98.4 F (36.9 C)  TempSrc: Oral  Resp: 14  Weight: 128 lb (58.06 kg)  SpO2: 97%      Assessment & Plan:

## 2016-06-18 NOTE — Patient Instructions (Signed)
We are checking the blood work today and will call you with the results.  We have sent in the medicine to take daily for headaches called atenolol. Take 1 pill daily and in 1-2 weeks it should be helping with the headaches.   We have also sent in a medicine called flexeril which is for you to use as needed for the headaches up to 3 times per day.   We are going to check the scan of the brain to make sure everything is the same as before.   If the labs are okay, we will let you know to start taking the prednisone as this will help the lungs and the feet.

## 2016-06-18 NOTE — Assessment & Plan Note (Addendum)
Needs recheck on CBC today as none in about 6 months and the headaches could represent low Hg. Does not admit to more bleeding.

## 2016-06-18 NOTE — Progress Notes (Signed)
Pre visit review using our clinic review tool, if applicable. No additional management support is needed unless otherwise documented below in the visit note. 

## 2016-06-18 NOTE — Assessment & Plan Note (Addendum)
Suspect coming from his cervical pathology and encouraged to return to his neurosurgeon. Checking CT head since no imaging and headaches are new. Checking ESR, CBC, CMP, TSH, free T4 for etiology of change. Some visual changes which are not new. Atenolol for prevention and flexeril for the headaches.

## 2016-06-18 NOTE — Assessment & Plan Note (Signed)
Advised he is able to take the prednisone pack for the pain.

## 2016-06-26 ENCOUNTER — Ambulatory Visit (INDEPENDENT_AMBULATORY_CARE_PROVIDER_SITE_OTHER)
Admission: RE | Admit: 2016-06-26 | Discharge: 2016-06-26 | Disposition: A | Discharge status: Home / Self Care | Payer: Medicare HMO | Source: Ambulatory Visit | Attending: Internal Medicine | Admitting: Internal Medicine

## 2016-06-26 DIAGNOSIS — G43811 Other migraine, intractable, with status migrainosus: Secondary | ICD-10-CM | POA: Diagnosis not present

## 2016-06-26 DIAGNOSIS — R51 Headache: Secondary | ICD-10-CM | POA: Diagnosis not present

## 2016-06-27 ENCOUNTER — Ambulatory Visit (INDEPENDENT_AMBULATORY_CARE_PROVIDER_SITE_OTHER): Payer: Medicare HMO | Admitting: Podiatry

## 2016-06-27 ENCOUNTER — Encounter: Payer: Self-pay | Admitting: Podiatry

## 2016-06-27 DIAGNOSIS — M779 Enthesopathy, unspecified: Secondary | ICD-10-CM

## 2016-06-27 DIAGNOSIS — M205X1 Other deformities of toe(s) (acquired), right foot: Secondary | ICD-10-CM

## 2016-06-28 NOTE — Progress Notes (Signed)
Subjective:     Patient ID: Vincent [name], male   DOB: [birth year], 55 y.o.   MRN: [id number]  HPI patient states that he's improving but he still gets pain in his big toe joint right over left   Review of Systems     Objective:   Physical Exam Neurovascular status intact muscle strength adequate with patient found to have continued range of motion loss first MPJ right over left with mild to moderate crepitus within the joint and pain when palpated    Assessment:     Continuation of inflammation around the first MPJ right or left    Plan:     H&P and condition reviewed with patient. I do think at one point surgical intervention may be necessary in this case to try to hold off and see how long we can get relief for before deciding whether or not this would be appropriate

## 2016-07-01 ENCOUNTER — Other Ambulatory Visit: Payer: Self-pay | Admitting: *Deleted

## 2016-07-01 DIAGNOSIS — D563 Thalassemia minor: Secondary | ICD-10-CM

## 2016-07-02 ENCOUNTER — Other Ambulatory Visit (HOSPITAL_BASED_OUTPATIENT_CLINIC_OR_DEPARTMENT_OTHER): Payer: Medicare HMO

## 2016-07-02 ENCOUNTER — Encounter: Payer: Self-pay | Admitting: Internal Medicine

## 2016-07-02 ENCOUNTER — Ambulatory Visit (HOSPITAL_BASED_OUTPATIENT_CLINIC_OR_DEPARTMENT_OTHER): Payer: Medicare HMO | Admitting: Internal Medicine

## 2016-07-02 ENCOUNTER — Telehealth: Payer: Self-pay | Admitting: Internal Medicine

## 2016-07-02 VITALS — BP 134/87 | HR 92 | Temp 98.5°F | Resp 18 | Ht 67.0 in | Wt 135.7 lb

## 2016-07-02 DIAGNOSIS — D500 Iron deficiency anemia secondary to blood loss (chronic): Secondary | ICD-10-CM

## 2016-07-02 DIAGNOSIS — D563 Thalassemia minor: Secondary | ICD-10-CM | POA: Diagnosis not present

## 2016-07-02 LAB — CBC WITH DIFFERENTIAL/PLATELET
BASO%: 0.2 % (ref 0.0–2.0)
Basophils Absolute: 0 10*3/uL (ref 0.0–0.1)
EOS%: 0.6 % (ref 0.0–7.0)
Eosinophils Absolute: 0.1 10*3/uL (ref 0.0–0.5)
HCT: 38.6 % (ref 38.4–49.9)
HGB: 12 g/dL — ABNORMAL LOW (ref 13.0–17.1)
LYMPH%: 13.9 % — AB (ref 14.0–49.0)
MCH: 24.5 pg — ABNORMAL LOW (ref 27.2–33.4)
MCHC: 31 g/dL — AB (ref 32.0–36.0)
MCV: 78.8 fL — ABNORMAL LOW (ref 79.3–98.0)
MONO#: 1.6 10*3/uL — AB (ref 0.1–0.9)
MONO%: 16 % — ABNORMAL HIGH (ref 0.0–14.0)
NEUT%: 69.3 % (ref 39.0–75.0)
NEUTROS ABS: 6.7 10*3/uL — AB (ref 1.5–6.5)
PLATELETS: 176 10*3/uL (ref 140–400)
RBC: 4.9 10*6/uL (ref 4.20–5.82)
RDW: 17.1 % — ABNORMAL HIGH (ref 11.0–14.6)
WBC: 9.7 10*3/uL (ref 4.0–10.3)
lymph#: 1.4 10*3/uL (ref 0.9–3.3)

## 2016-07-02 LAB — COMPREHENSIVE METABOLIC PANEL
ALT: 117 U/L — AB (ref 0–55)
AST: 42 U/L — AB (ref 5–34)
Albumin: 3.3 g/dL — ABNORMAL LOW (ref 3.5–5.0)
Alkaline Phosphatase: 67 U/L (ref 40–150)
Anion Gap: 8 mEq/L (ref 3–11)
BUN: 10.3 mg/dL (ref 7.0–26.0)
CHLORIDE: 110 meq/L — AB (ref 98–109)
CO2: 20 meq/L — AB (ref 22–29)
CREATININE: 1.2 mg/dL (ref 0.7–1.3)
Calcium: 9.1 mg/dL (ref 8.4–10.4)
EGFR: 83 mL/min/{1.73_m2} — ABNORMAL LOW (ref >90)
GLUCOSE: 81 mg/dL (ref 70–140)
Potassium: 3.6 mEq/L (ref 3.5–5.1)
SODIUM: 138 meq/L (ref 136–145)
TOTAL PROTEIN: 6.9 g/dL (ref 6.4–8.3)

## 2016-07-02 LAB — TECHNOLOGIST REVIEW

## 2016-07-02 NOTE — Progress Notes (Signed)
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  PRIOR THERAPY: Feraheme 510 mg IV 2 doses last dose was given on 08/16/2015.  CURRENT THERAPY: Oral iron tablets over-the-counter.  INTERVAL HISTORY: Vincent [name] 55 y.o. male returns to the clinic today for follow-up visit accompanied by his wife. The patient is feeling good with no specific complaints. He denied having any significant fatigue or weakness. He lost a few pounds recently and was started by his primary care physician on prednisone and feeling better with weight gain. He has no chest pain, shortness of breath, cough or hemoptysis. He had repeat CBC, iron study and ferritin performed earlier today and he is here for evaluation and discussion of his lab results.   MEDICAL HISTORY: Past Medical History:  Diagnosis Date  . Anemia   . Blood transfusion without reported diagnosis   . CAP (community acquired pneumonia)   . COPD (chronic obstructive pulmonary disease) ([place])   . Essential hypertension 07/05/2013   Chronic    . Glaucoma   . Hypertension   . Iron deficiency anemia due to chronic blood loss 08/02/2015    9/16 The patient had an iron infusion (Ferumoxytol) this morning and left about a half hour later and was fine. Now he is home and he is breaking out in sweat, nauseated and dizzy when standing. His BP is 160/86 and HR is between 90-96". It was his second and last iron infusion.  Likely side effects w/Ferumoxytol IV.    Marland Kitchen Severe protein-calorie malnutrition ([place])   . Thalassemia minor 08/02/2015  . Tobacco use disorder 04/10/2015    ALLERGIES:  has No Known Allergies.  MEDICATIONS:  Current Outpatient Prescriptions  Medication Sig Dispense Refill  . albuterol (PROVENTIL HFA;VENTOLIN HFA)  108 (90 BASE) MCG/ACT inhaler Inhale 2 puffs into the lungs every 6 (six) hours as needed for wheezing or shortness of breath. 1 Inhaler 2  . atenolol (TENORMIN) 25 MG tablet Take 1 tablet (25 mg total) by mouth daily. 90 tablet 3  . celecoxib (CELEBREX) 200 MG capsule Take 1 capsule (200 mg total) by mouth 2 (two) times daily. 60 capsule 2  . cyclobenzaprine (FLEXERIL) 5 MG tablet Take 1 tablet (5 mg total) by mouth 3 (three) times daily as needed for muscle spasms. 30 tablet 1  . ferrous sulfate 325 (65 FE) MG tablet Take 325 mg by mouth daily with breakfast.    . predniSONE (DELTASONE) 10 MG tablet 12 day tapering dose 48 tablet 0  . SUMAtriptan (IMITREX) 100 MG tablet TAKE 1 TABLET BY MOUTH EVERY 2 HOURS AS NEEDED FOR MIGRAINE OR HEADACHE. MAY REPEAT IN 2 HOURS  11  . tiotropium (SPIRIVA HANDIHALER) 18 MCG inhalation capsule Place 1 capsule (18 mcg total) into inhaler and inhale daily. 30 capsule 12  . topiramate (TOPAMAX) 50 MG tablet      No current facility-administered medications for this visit.     SURGICAL HISTORY:  Past Surgical History:  Procedure Laterality Date  . plate in neck    . right shoulder arthroscopy      REVIEW OF SYSTEMS:  A comprehensive review of systems was negative.   PHYSICAL EXAMINATION: General appearance: alert, cooperative and no distress Head: Normocephalic, without obvious abnormality, atraumatic Neck: no adenopathy, no JVD, supple, symmetrical, trachea  midline and thyroid not enlarged, symmetric, no tenderness/mass/nodules Lymph nodes: Cervical, supraclavicular, and axillary nodes normal. Resp: clear to auscultation bilaterally Back: symmetric, no curvature. ROM normal. No CVA tenderness. Cardio: regular rate and rhythm, S1, S2 normal, no murmur, click, rub or gallop GI: soft, non-tender; bowel sounds normal; no masses,  no organomegaly Extremities: extremities normal, atraumatic, no cyanosis or edema  ECOG PERFORMANCE STATUS: 0 -  Asymptomatic  Blood pressure 134/87, pulse 92, temperature 98.5 F (36.9 C), temperature source Oral, resp. rate 18, height 5\' 7"  (1.702 m), weight 135 lb 11.2 oz (61.6 kg), SpO2 100 %.  LABORATORY DATA: Lab Results  Component Value Date   WBC 9.7 07/02/2016   HGB 12.0 (L) 07/02/2016   HCT 38.6 07/02/2016   MCV 78.8 (L) 07/02/2016   PLT 176 07/02/2016      Chemistry      Component Value Date/Time   NA 135 06/18/2016 0840   NA 139 08/02/2015 1227   K 4.0 06/18/2016 0840   K 4.6 08/02/2015 1227   CL 104 06/18/2016 0840   CO2 22 06/18/2016 0840   CO2 23 08/02/2015 1227   BUN 4 (L) 06/18/2016 0840   BUN 7.5 08/02/2015 1227   CREATININE 0.93 06/18/2016 0840   CREATININE 1.2 08/02/2015 1227      Component Value Date/Time   CALCIUM 9.5 06/18/2016 0840   CALCIUM 9.7 08/02/2015 1227   ALKPHOS 110 06/18/2016 0840   ALKPHOS 77 08/02/2015 1227   AST 161 (H) 06/18/2016 0840   AST 17 08/02/2015 1227   ALT 78 (H) 06/18/2016 0840   ALT 14 08/02/2015 1227   BILITOT 0.7 06/18/2016 0840   BILITOT 0.52 08/02/2015 1227       RADIOGRAPHIC STUDIES: Ct Head Wo Contrast  Result Date: 06/26/2016 CLINICAL DATA:  Two-month history of nighttime headaches. Nausea and dizziness. EXAM: CT HEAD WITHOUT CONTRAST TECHNIQUE: Contiguous axial images were obtained from the base of the skull through the vertex without intravenous contrast. COMPARISON:  July 01, 2006 FINDINGS: Brain: The ventricles are normal in size and configuration. There is no intracranial mass, hemorrhage, extra-axial fluid collection, or midline shift. Gray-white compartments are normal. No acute infarct is evident. Vascular: No hyperdense vessels are evident. There is no appreciable vascular calcification. Skull: The bony calvarium appears intact. Sinuses/Orbits: Visualized paranasal sinuses are clear. Visualized orbits appear symmetric bilaterally. Other: There is opacification of multiple mastoid air cells bilaterally. No air-fluid  levels are evident. Previously noted right frontal scalp lipoma no longer appreciable. IMPRESSION: Opacification of multiple mastoid air cells bilaterally without air-fluid level appreciable. No intracranial mass, hemorrhage, or focal gray -white compartment lesion. Previously noted right frontal scalp lipoma no longer apparent. Electronically Signed   By: Lowella Grip III M.D.   On: 06/26/2016 09:48   Dg Foot 2 Views Left  Result Date: 06/24/2016 Please see Notes tab for imaging impression.  Dg Foot 2 Views Right  Result Date: 06/24/2016 Please see Notes tab for imaging impression.   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 He is status post Feraheme infusion with significant improvement in his hemoglobin and hematocrit. His CBC showed mild anemia with hemoglobin of 12.0. Iron study and ferritin are still pending. I recommended for the patient to continue his current treatment with the oral iron tablets for now. I will see him back for follow-up visit in 6 months for reevaluation  after repeating CBC, iron study and ferritin. The patient was advised to call immediately if he has any concerning symptoms in the interval. The patient voices understanding of current disease status and treatment options and is in agreement with the current care plan. All questions were answered. The patient knows to call the clinic with any problems, questions or concerns. We can certainly see the patient much sooner if necessary.  Disclaimer: This note was dictated with voice recognition software. Similar sounding words can inadvertently be transcribed and may not be corrected upon review.

## 2016-07-02 NOTE — Telephone Encounter (Signed)
Gave pt cal & avs °

## 2016-07-27 ENCOUNTER — Telehealth: Payer: Self-pay

## 2016-07-27 NOTE — Telephone Encounter (Signed)
Patient is on the list for [facility] 2017 and may be a good candidate for an AWV in 2017. Please let me know if/when appt is scheduled.   

## 2016-10-04 ENCOUNTER — Encounter: Payer: Self-pay | Admitting: Internal Medicine

## 2016-10-04 ENCOUNTER — Ambulatory Visit (INDEPENDENT_AMBULATORY_CARE_PROVIDER_SITE_OTHER): Payer: Medicare HMO | Admitting: Internal Medicine

## 2016-10-04 DIAGNOSIS — H9191 Unspecified hearing loss, right ear: Secondary | ICD-10-CM | POA: Diagnosis not present

## 2016-10-04 DIAGNOSIS — Z23 Encounter for immunization: Secondary | ICD-10-CM | POA: Diagnosis not present

## 2016-10-04 MED ORDER — NEOMYCIN-POLYMYXIN-HC 3.5-10000-1 OT SUSP: 3.0000 [drp] | Freq: Three times a day (TID) | OTIC | 0 refills | Status: DC

## 2016-10-04 NOTE — Progress Notes (Signed)
   Subjective:    Patient ID: Vincent [name], male    DOB: July 02, 1961, 54 y.o.   MRN: [id number]  HPI The patient is a 55 YO male coming in for right ear hearing loss. No loud noise or damage to the ear recently. It is going on for about a month now. No change since onset. He woke up with it. Denies sinus problems or infections recently. Otherwise feels well. Left ear is normal hearing. No pain with the ear but some discomfort with tugging on it.    Review of Systems  Constitutional: Negative.   HENT: Positive for ear pain and hearing loss. Negative for congestion, ear discharge, postnasal drip, sinus pressure, sneezing and trouble swallowing.   Eyes: Negative.   Respiratory: Negative.   Cardiovascular: Negative.   Skin: Negative.       Objective:   Physical Exam  Constitutional: He is oriented to person, place, and time. He appears well-developed and well-nourished.  HENT:  Head: Normocephalic and atraumatic.  Left Ear: External ear normal.  Nose: Nose normal.  Mouth/Throat: Oropharynx is clear and moist.  Right ear with some redness in the canal and some pain with tugging on the ear, TM normal  Eyes: EOM are normal.  Neck: Normal range of motion.  Cardiovascular: Normal rate and regular rhythm.   Pulmonary/Chest: Effort normal and breath sounds normal.  Abdominal: Soft.  Neurological: He is alert and oriented to person, place, and time.  Skin: Skin is warm and dry.   Vitals:   10/04/16 0801  BP: 116/64  Pulse: 88  Resp: 14  Temp: 98.5 F (36.9 C)  TempSrc: Oral  SpO2: 98%  Weight: 133 lb 12.8 oz (60.7 kg)  Height: 5\' 7"  (1.702 m)      Assessment & Plan:  Flu shot given at visit.

## 2016-10-04 NOTE — Assessment & Plan Note (Signed)
Rx for corticosporin ear drops to help with otitis externa to see if this is related. Since the hearing has been gone for 1 month already it is unclear if it will return. If not, he will call back and referral to audiology.

## 2016-10-04 NOTE — Progress Notes (Signed)
Pre visit review using our clinic review tool, if applicable. No additional management support is needed unless otherwise documented below in the visit note. 

## 2016-10-04 NOTE — Patient Instructions (Signed)
We have sent in the corticosporin ear drops. Use 3 drops in the right ear 3 times per day for the next 5 days.   If the hearing does not come back we will send you for the hearing evaluation to see if they can find the problem.

## 2016-10-09 ENCOUNTER — Other Ambulatory Visit: Payer: Self-pay | Admitting: Internal Medicine

## 2016-10-23 DIAGNOSIS — J449 Chronic obstructive pulmonary disease, unspecified: Secondary | ICD-10-CM | POA: Diagnosis not present

## 2016-10-23 DIAGNOSIS — Z6820 Body mass index (BMI) 20.0-20.9, adult: Secondary | ICD-10-CM | POA: Diagnosis not present

## 2016-10-23 DIAGNOSIS — Z0000 Encounter for general adult medical examination without abnormal findings: Secondary | ICD-10-CM | POA: Diagnosis not present

## 2016-10-23 DIAGNOSIS — I10 Essential (primary) hypertension: Secondary | ICD-10-CM | POA: Diagnosis not present

## 2016-10-23 DIAGNOSIS — J9600 Acute respiratory failure, unspecified whether with hypoxia or hypercapnia: Secondary | ICD-10-CM | POA: Diagnosis not present

## 2016-12-30 ENCOUNTER — Other Ambulatory Visit (HOSPITAL_BASED_OUTPATIENT_CLINIC_OR_DEPARTMENT_OTHER): Payer: Medicare HMO

## 2016-12-30 DIAGNOSIS — D500 Iron deficiency anemia secondary to blood loss (chronic): Secondary | ICD-10-CM

## 2016-12-30 DIAGNOSIS — D563 Thalassemia minor: Secondary | ICD-10-CM

## 2016-12-30 LAB — CBC WITH DIFFERENTIAL/PLATELET
BASO%: 1.9 % (ref 0.0–2.0)
BASOS ABS: 0.1 10*3/uL (ref 0.0–0.1)
EOS%: 1.9 % (ref 0.0–7.0)
Eosinophils Absolute: 0.1 10*3/uL (ref 0.0–0.5)
HCT: 37.7 % — ABNORMAL LOW (ref 38.4–49.9)
HGB: 11.9 g/dL — ABNORMAL LOW (ref 13.0–17.1)
LYMPH#: 1.5 10*3/uL (ref 0.9–3.3)
LYMPH%: 28.3 % (ref 14.0–49.0)
MCH: 25.3 pg — AB (ref 27.2–33.4)
MCHC: 31.6 g/dL — AB (ref 32.0–36.0)
MCV: 80.2 fL (ref 79.3–98.0)
MONO#: 0.7 10*3/uL (ref 0.1–0.9)
MONO%: 12.9 % (ref 0.0–14.0)
NEUT#: 2.9 10*3/uL (ref 1.5–6.5)
NEUT%: 55 % (ref 39.0–75.0)
Platelets: 181 10*3/uL (ref 140–400)
RBC: 4.7 10*6/uL (ref 4.20–5.82)
RDW: 14.4 % (ref 11.0–14.6)
WBC: 5.3 10*3/uL (ref 4.0–10.3)

## 2016-12-30 LAB — IRON AND TIBC
%SAT: 62 % — ABNORMAL HIGH (ref 20–55)
IRON: 161 ug/dL (ref 42–163)
TIBC: 258 ug/dL (ref 202–409)
UIBC: 97 ug/dL — AB (ref 117–376)

## 2016-12-30 LAB — FERRITIN: Ferritin: 37 ng/ml (ref 22–316)

## 2017-01-06 ENCOUNTER — Ambulatory Visit (HOSPITAL_BASED_OUTPATIENT_CLINIC_OR_DEPARTMENT_OTHER): Payer: Medicare HMO | Admitting: Internal Medicine

## 2017-01-06 ENCOUNTER — Encounter: Payer: Self-pay | Admitting: Internal Medicine

## 2017-01-06 ENCOUNTER — Telehealth: Payer: Self-pay | Admitting: Internal Medicine

## 2017-01-06 VITALS — BP 148/83 | HR 86 | Temp 98.6°F | Resp 18 | Wt 133.4 lb

## 2017-01-06 DIAGNOSIS — D563 Thalassemia minor: Secondary | ICD-10-CM

## 2017-01-06 DIAGNOSIS — D500 Iron deficiency anemia secondary to blood loss (chronic): Secondary | ICD-10-CM | POA: Diagnosis not present

## 2017-01-06 DIAGNOSIS — K922 Gastrointestinal hemorrhage, unspecified: Secondary | ICD-10-CM

## 2017-01-06 NOTE — Telephone Encounter (Signed)
Appointments scheduled per 01/06/17 los. Patient was given a copy of the appointment schedule and AVS report, per 01/06/17 los. °

## 2017-01-06 NOTE — Progress Notes (Signed)
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  PRIOR THERAPY: Feraheme 510 mg IV 2 doses last dose was given on 08/16/2015.  CURRENT THERAPY: Oral iron tablets over-the-counter.  INTERVAL HISTORY: Vincent [name] 56 y.o. male came to the clinic today for six-month follow-up visit. The patient is feeling fine today was no specific complaints except for mild fatigue. He denied having any significant chest pain, shortness breath, cough or hemoptysis. He has no nausea or vomiting. He denied having any fever or chills. He has no nausea or vomiting. He is currently on oral iron tablets 1 by mouth daily. He is tolerating it well with no adverse effects. He had repeat CBC, iron study and ferritin performed recently and he is here for evaluation and discussion of his lab results.   MEDICAL HISTORY: Past Medical History:  Diagnosis Date  . Anemia   . Blood transfusion without reported diagnosis   . CAP (community acquired pneumonia)   . COPD (chronic obstructive pulmonary disease) ([place])   . Essential hypertension 07/05/2013   Chronic    . Glaucoma   . Hypertension   . Iron deficiency anemia due to chronic blood loss 08/02/2015    9/16 The patient had an iron infusion (Ferumoxytol) this morning and left about a half hour later and was fine. Now he is home and he is breaking out in sweat, nauseated and dizzy when standing. His BP is 160/86 and HR is between 90-96". It was his second and last iron infusion.  Likely side effects w/Ferumoxytol IV.    Marland Kitchen Severe protein-calorie malnutrition ([place])   . Thalassemia minor 08/02/2015  . Tobacco use disorder 04/10/2015    ALLERGIES:  has No Known Allergies.  MEDICATIONS:  Current Outpatient Prescriptions  Medication Sig Dispense  Refill  . albuterol (PROVENTIL HFA;VENTOLIN HFA) 108 (90 BASE) MCG/ACT inhaler Inhale 2 puffs into the lungs every 6 (six) hours as needed for wheezing or shortness of breath. 1 Inhaler 2  . atenolol (TENORMIN) 25 MG tablet Take 1 tablet (25 mg total) by mouth daily. 90 tablet 3  . cyclobenzaprine (FLEXERIL) 5 MG tablet Take 1 tablet (5 mg total) by mouth 3 (three) times daily as needed for muscle spasms. 30 tablet 1  . ferrous sulfate 325 (65 FE) MG tablet Take 325 mg by mouth daily with breakfast.    . neomycin-polymyxin-hydrocortisone (CORTISPORIN) 3.5-10000-1 otic suspension Place 3 drops into the right ear 3 (three) times daily. 10 mL 0  . tiotropium (SPIRIVA HANDIHALER) 18 MCG inhalation capsule Place 1 capsule (18 mcg total) into inhaler and inhale daily. 30 capsule 12  . topiramate (TOPAMAX) 50 MG tablet TAKE 1 TABLET BY MOUTH TWICE A DAY 60 tablet 2  . SUMAtriptan (IMITREX) 100 MG tablet TAKE 1 TABLET BY MOUTH EVERY 2 HOURS AS NEEDED FOR MIGRAINE OR HEADACHE. MAY REPEAT IN 2 HOURS  11   No current facility-administered medications for this visit.     SURGICAL HISTORY:  Past Surgical History:  Procedure Laterality Date  . plate in neck    . right shoulder arthroscopy      REVIEW OF SYSTEMS:  A comprehensive review of systems was negative except for: Constitutional: positive for fatigue   PHYSICAL EXAMINATION: General appearance: alert, cooperative, fatigued and no distress Head: Normocephalic, without  obvious abnormality, atraumatic Neck: no adenopathy, no JVD, supple, symmetrical, trachea midline and thyroid not enlarged, symmetric, no tenderness/mass/nodules Lymph nodes: Cervical, supraclavicular, and axillary nodes normal. Resp: clear to auscultation bilaterally Back: symmetric, no curvature. ROM normal. No CVA tenderness. Cardio: regular rate and rhythm, S1, S2 normal, no murmur, click, rub or gallop GI: soft, non-tender; bowel sounds normal; no masses,  no  organomegaly Extremities: extremities normal, atraumatic, no cyanosis or edema  ECOG PERFORMANCE STATUS: 0 - Asymptomatic  Blood pressure (!) 148/83, pulse 86, temperature 98.6 F (37 C), temperature source Oral, resp. rate 18, weight 133 lb 6.4 oz (60.5 kg), SpO2 100 %.  LABORATORY DATA: Lab Results  Component Value Date   WBC 5.3 12/30/2016   HGB 11.9 (L) 12/30/2016   HCT 37.7 (L) 12/30/2016   MCV 80.2 12/30/2016   PLT 181 12/30/2016      Chemistry      Component Value Date/Time   NA 138 07/02/2016 0809   K 3.6 07/02/2016 0809   CL 104 06/18/2016 0840   CO2 20 (L) 07/02/2016 0809   BUN 10.3 07/02/2016 0809   CREATININE 1.2 07/02/2016 0809      Component Value Date/Time   CALCIUM 9.1 07/02/2016 0809   ALKPHOS 67 07/02/2016 0809   AST 42 (H) 07/02/2016 0809   ALT 117 (H) 07/02/2016 0809   BILITOT <0.30 07/02/2016 0809       RADIOGRAPHIC STUDIES: No results found.  ASSESSMENT AND PLAN:  This is a very pleasant 56 years old African-American male with iron deficiency anemia secondary to chronic GI blood loss in addition to thalassemia minor. The patient is currently on oral iron tablets over the counter and he is tolerating it well. His hemoglobin and hematocrit are stable. His iron study and ferritin are acceptable but his ferritin is in the lower range. I recommended for him to increase oral iron tablets 2 twice a day with vitamin C or orange juice. I will see him back for follow-up visit in 6 months for reevaluation with repeat CBC, iron study and ferritin. He was advised to call immediately if he has any concerning symptoms in the interval. The patient voices understanding of current disease status and treatment options and is in agreement with the current care plan. All questions were answered. The patient knows to call the clinic with any problems, questions or concerns. We can certainly see the patient much sooner if necessary. I spent 10 minutes counseling the  patient face to face. The total time spent in the appointment was 15 minutes.  Disclaimer: This note was dictated with voice recognition software. Similar sounding words can inadvertently be transcribed and may not be corrected upon review.

## 2017-05-29 ENCOUNTER — Telehealth: Payer: Self-pay

## 2017-05-29 NOTE — Telephone Encounter (Signed)
Patient is on the list for [employer] 2018 and may be a good candidate for an AWV. Please let me know if/when appt is scheduled.   

## 2017-05-30 ENCOUNTER — Telehealth: Payer: Self-pay | Admitting: Internal Medicine

## 2017-05-30 NOTE — Telephone Encounter (Signed)
called pt to schedule awv. lvm for pt to call office to schedule appt.

## 2017-05-30 NOTE — Telephone Encounter (Signed)
AWV scheduled for 7/25 at 8am

## 2017-07-02 ENCOUNTER — Ambulatory Visit (INDEPENDENT_AMBULATORY_CARE_PROVIDER_SITE_OTHER): Payer: Medicare HMO | Admitting: *Deleted

## 2017-07-02 VITALS — BP 128/72 | HR 78 | Resp 20 | Ht 67.0 in | Wt 133.0 lb

## 2017-07-02 DIAGNOSIS — Z0000 Encounter for general adult medical examination without abnormal findings: Secondary | ICD-10-CM | POA: Diagnosis not present

## 2017-07-02 DIAGNOSIS — J449 Chronic obstructive pulmonary disease, unspecified: Secondary | ICD-10-CM | POA: Diagnosis not present

## 2017-07-02 DIAGNOSIS — M79671 Pain in right foot: Secondary | ICD-10-CM | POA: Diagnosis not present

## 2017-07-02 DIAGNOSIS — M79672 Pain in left foot: Secondary | ICD-10-CM | POA: Diagnosis not present

## 2017-07-02 DIAGNOSIS — H547 Unspecified visual loss: Secondary | ICD-10-CM

## 2017-07-02 NOTE — Progress Notes (Signed)
Pre visit review using our clinic review tool, if applicable. No additional management support is needed unless otherwise documented below in the visit note. 

## 2017-07-02 NOTE — Progress Notes (Addendum)
Subjective:   Vincent [name] is a 56 y.o. male who presents for an Initial Medicare Annual Wellness Visit.  Review of Systems  No ROS.  Medicare Wellness Visit. Additional risk factors are reflected in the social history.    Sleep patterns: has frequent nighttime awakenings, has daytime sleepiness, does not get up to void, gets up 1-2 times nightly to void and sleeps 4-6 hours nightly.  Patient reports insomnia issues, discussed recommended sleep tips and stress reduction tips, education was attached to patient's AVS.  Home Safety/Smoke Alarms: Feels safe in home. Smoke alarms in place.  Living environment; residence and Firearm Safety: 1-story house/ trailer, no firearms. Lives with wife, good support system  Seat Belt Safety/Bike Helmet: Wears seat belt.   Counseling:   Eye Exam- Last many years per patient, referral placed today Dental- resources provided  Male:   CCS-  Last 09/11/15,  Recall 10 years  PSA- No results found for: PSA     Objective:    There were no vitals filed for this visit. There is no height or weight on file to calculate BMI.  Current Medications (verified) Outpatient Encounter Prescriptions as of 07/02/2017  Medication Sig  . albuterol (PROVENTIL HFA;VENTOLIN HFA) 108 (90 BASE) MCG/ACT inhaler Inhale 2 puffs into the lungs every 6 (six) hours as needed for wheezing or shortness of breath.  Marland Kitchen atenolol (TENORMIN) 25 MG tablet Take 1 tablet (25 mg total) by mouth daily.  . cyclobenzaprine (FLEXERIL) 5 MG tablet Take 1 tablet (5 mg total) by mouth 3 (three) times daily as needed for muscle spasms.  . ferrous sulfate 325 (65 FE) MG tablet Take 325 mg by mouth daily with breakfast.  . neomycin-polymyxin-hydrocortisone (CORTISPORIN) 3.5-10000-1 otic suspension Place 3 drops into the right ear 3 (three) times daily.  . SUMAtriptan (IMITREX) 100 MG tablet TAKE 1 TABLET BY MOUTH EVERY 2 HOURS AS NEEDED FOR MIGRAINE OR HEADACHE. MAY REPEAT IN 2 HOURS  . tiotropium  (SPIRIVA HANDIHALER) 18 MCG inhalation capsule Place 1 capsule (18 mcg total) into inhaler and inhale daily.  Marland Kitchen topiramate (TOPAMAX) 50 MG tablet TAKE 1 TABLET BY MOUTH TWICE A DAY   No facility-administered encounter medications on file as of 07/02/2017.     Allergies (verified) Patient has no known allergies.   History: Past Medical History:  Diagnosis Date  . Anemia   . Blood transfusion without reported diagnosis   . CAP (community acquired pneumonia)   . COPD (chronic obstructive pulmonary disease) ([place])   . Essential hypertension 07/05/2013   Chronic    . Glaucoma   . Hypertension   . Iron deficiency anemia due to chronic blood loss 08/02/2015    9/16 The patient had an iron infusion (Ferumoxytol) this morning and left about a half hour later and was fine. Now he is home and he is breaking out in sweat, nauseated and dizzy when standing. His BP is 160/86 and HR is between 90-96". It was his second and last iron infusion.  Likely side effects w/Ferumoxytol IV.    Marland Kitchen Severe protein-calorie malnutrition ([place])   . Thalassemia minor 08/02/2015  . Tobacco use disorder 04/10/2015   Past Surgical History:  Procedure Laterality Date  . plate in neck    . right shoulder arthroscopy     Family History  Problem Relation Age of Onset  . Heart disease Father   . Sickle cell anemia Mother        deceased  . Diabetes Sister  Social History   Occupational History  . disabled    Social History Main Topics  . Smoking status: Former Smoker    Packs/day: 0.50    Years: 40.00    Types: Cigarettes    Quit date: 03/10/2015  . Smokeless tobacco: Never Used  . Alcohol use No     Comment: occasionally   . Drug use: No  . Sexual activity: Not Currently   Tobacco Counseling Counseling given: Not Answered   Activities of Daily Living No flowsheet data found.  Immunizations and Health Maintenance Immunization History  Administered Date(s) Administered  . Influenza,inj,Quad PF,36+ Mos  08/17/2014, 10/04/2016  . Pneumococcal Polysaccharide-23 04/09/2015  . Tdap 12/10/2007   Health Maintenance Due  Topic Date Due  . Hepatitis C Screening  12/01/1961    Patient Care Team: [name], MD as PCP - General (Internal Medicine)  Indicate any recent Medical Services you may have received from other than [facility] providers in the past year (date may be approximate).    Assessment:   This is a routine wellness examination for Vincent [name]. Physical assessment deferred to PCP.   Hearing/Vision screen No exam data present  Dietary issues and exercise activities discussed:   Diet (meal preparation, eat out, water intake, caffeinated beverages, dairy products, fruits and vegetables): in general, a "healthy" diet  , well balanced, diabetic, low fat/ cholesterol, low salt   Reviewed heart healthy and diabetic diet, encouraged patient to increase daily water intake.    Goals    None     Depression Screen PHQ 2/9 Scores 07/05/2013  PHQ - 2 Score 0    Fall Risk Fall Risk  07/05/2013  Falls in the past year? No    Cognitive Function:       Ad8 score reviewed for issues:  Issues making decisions: no  Less interest in hobbies / activities: no  Repeats questions, stories (family complaining): no  Trouble using ordinary gadgets (microwave, computer, phone):no  Forgets the month or year: no  Mismanaging finances: no  Remembering appts: no  Daily problems with thinking and/or memory: no Ad8 score is= 0    Screening Tests Health Maintenance  Topic Date Due  . Hepatitis C Screening  [birth year]  . INFLUENZA VACCINE  07/09/2017  . TETANUS/TDAP  12/09/2017  . COLONOSCOPY  09/10/2025  . 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 Patient reports that he was told at one time he had glaucoma but has not had eye exam in about 8 years.  Tub chair ordered  for safety and to help maintain independence of ADLs  Continue doing brain stimulating activities (puzzles, reading, adult coloring books, staying active) to keep memory sharp.   Continue to eat heart healthy diet (full of fruits, vegetables, whole grains, lean protein, water--limit salt, fat, and sugar intake) and increase physical activity as tolerated.  I have personally reviewed and noted the following in the patient's chart:   . Medical and social history . Use of alcohol, tobacco or illicit drugs  . Current medications and supplements . Functional ability and status . Nutritional status . Physical activity . Advanced directives . List of other physicians . Vitals . Screenings to include cognitive, depression, and falls . Referrals and appointments  In addition, I have reviewed and discussed with patient certain preventive protocols, quality metrics, and best practice  recommendations. A written personalized care plan for preventive services as well as general preventive health recommendations were provided to patient.     Michiel Cowboy, RN   07/02/2017   Medical screening examination/treatment/procedure(s) were performed by non-physician practitioner and as supervising physician I was immediately available for consultation/collaboration. I agree with above. Walker Kehr, MD

## 2017-07-02 NOTE — Patient Instructions (Signed)
Continue doing brain stimulating activities (puzzles, reading, adult coloring books, staying active) to keep memory sharp.   Continue to eat heart healthy diet (full of fruits, vegetables, whole grains, lean protein, water--limit salt, fat, and sugar intake) and increase physical activity as tolerated.   Vincent [name] , Thank you for taking time to come for your Medicare Wellness Visit. I appreciate your ongoing commitment to your health goals. Please review the following plan we discussed and let me know if I can assist you in the future.   These are the goals we discussed: Goals    . Be as active and healthy as possible          Continue to walk daily, help around the house, do yard work, eat well with a lot of fruits and vegetables, enjoy life and family       This is a list of the screening recommended for you and due dates:  Health Maintenance  Topic Date Due  .  Hepatitis C: One time screening is recommended by Center for Disease Control  (CDC) for  adults born from [birth year] through 1965.   12/08/1961  . Flu Shot  07/09/2017  . Tetanus Vaccine  12/09/2017  . Colon Cancer Screening  09/10/2025  . HIV Screening  Completed    Stress and Stress Management Stress is a normal reaction to life events. It is what you feel when life demands more than you are used to or more than you can handle. Some stress can be useful. For example, the stress reaction can help you catch the last bus of the day, study for a test, or meet a deadline at work. But stress that occurs too often or for too long can cause problems. It can affect your emotional health and interfere with relationships and normal daily activities. Too much stress can weaken your immune system and increase your risk for physical illness. If you already have a medical problem, stress can make it worse. What are the causes? All sorts of life events may cause stress. An event that causes stress for one person may not be stressful for another  person. Major life events commonly cause stress. These may be positive or negative. Examples include losing your job, moving into a new home, getting married, having a baby, or losing a loved one. Less obvious life events may also cause stress, especially if they occur day after day or in combination. Examples include working long hours, driving in traffic, caring for children, being in debt, or being in a difficult relationship. What are the signs or symptoms? Stress may cause emotional symptoms including, the following:  Anxiety. This is feeling worried, afraid, on edge, overwhelmed, or out of control.  Anger. This is feeling irritated or impatient.  Depression. This is feeling sad, down, helpless, or guilty.  Difficulty focusing, remembering, or making decisions.  Stress may cause physical symptoms, including the following:  Aches and pains. These may affect your head, neck, back, stomach, or other areas of your body.  Tight muscles or clenched jaw.  Low energy or trouble sleeping.  Stress may cause unhealthy behaviors, including the following:  Eating to feel better (overeating) or skipping meals.  Sleeping too little, too much, or both.  Working too much or putting off tasks (procrastination).  Smoking, drinking alcohol, or using drugs to feel better.  How is this diagnosed? Stress is diagnosed through an assessment by your health care provider. Your health care provider will ask questions about your  symptoms and any stressful life events.Your health care provider will also ask about your medical history and may order blood tests or other tests. Certain medical conditions and medicine can cause physical symptoms similar to stress. Mental illness can cause emotional symptoms and unhealthy behaviors similar to stress. Your health care provider may refer you to a mental health professional for further evaluation. How is this treated? Stress management is the recommended treatment  for stress.The goals of stress management are reducing stressful life events and coping with stress in healthy ways. Techniques for reducing stressful life events include the following:  Stress identification. Self-monitor for stress and identify what causes stress for you. These skills may help you to avoid some stressful events.  Time management. Set your priorities, keep a calendar of events, and learn to say "no." These tools can help you avoid making too many commitments.  Techniques for coping with stress include the following:  Rethinking the problem. Try to think realistically about stressful events rather than ignoring them or overreacting. Try to find the positives in a stressful situation rather than focusing on the negatives.  Exercise. Physical exercise can release both physical and emotional tension. The key is to find a form of exercise you enjoy and do it regularly.  Relaxation techniques. These relax the body and mind. Examples include yoga, meditation, tai chi, biofeedback, deep breathing, progressive muscle relaxation, listening to music, being out in nature, journaling, and other hobbies. Again, the key is to find one or more that you enjoy and can do regularly.  Healthy lifestyle. Eat a balanced diet, get plenty of sleep, and do not smoke. Avoid using alcohol or drugs to relax.  Strong support network. Spend time with family, friends, or other people you enjoy being around.Express your feelings and talk things over with someone you trust.  Counseling or talktherapy with a mental health professional may be helpful if you are having difficulty managing stress on your own. Medicine is typically not recommended for the treatment of stress.Talk to your health care provider if you think you need medicine for symptoms of stress. Follow these instructions at home:  Keep all follow-up visits as directed by your health care provider.  Take all medicines as directed by your  health care provider. Contact a health care provider if:  Your symptoms get worse or you start having new symptoms.  You feel overwhelmed by your problems and can no longer manage them on your own. Get help right away if:  You feel like hurting yourself or someone else. This information is not intended to replace advice given to you by your health care provider. Make sure you discuss any questions you have with your health care provider. Document Released: 05/21/2001 Document Revised: 05/02/2016 Document Reviewed: 07/20/2013 Elsevier Interactive Patient Education  2017 [employer]. Insomnia Insomnia is a sleep disorder that makes it difficult to fall asleep or to stay asleep. Insomnia can cause tiredness (fatigue), low energy, difficulty concentrating, mood swings, and poor performance at work or school. There are three different ways to classify insomnia:  Difficulty falling asleep.  Difficulty staying asleep.  Waking up too early in the morning.  Any type of insomnia can be long-term (chronic) or short-term (acute). Both are common. Short-term insomnia usually lasts for three months or less. Chronic insomnia occurs at least three times a week for longer than three months. What are the causes? Insomnia may be caused by another condition, situation, or substance, such as:  Anxiety.  Certain medicines.  Gastroesophageal reflux disease (GERD) or other gastrointestinal conditions.  Asthma or other breathing conditions.  Restless legs syndrome, sleep apnea, or other sleep disorders.  Chronic pain.  Menopause. This may include hot flashes.  Stroke.  Abuse of alcohol, tobacco, or illegal drugs.  Depression.  Caffeine.  Neurological disorders, such as Alzheimer disease.  An overactive thyroid (hyperthyroidism).  The cause of insomnia may not be known. What increases the risk? Risk factors for insomnia include:  Gender. Women are more commonly affected than  men.  Age. Insomnia is more common as you get older.  Stress. This may involve your professional or personal life.  Income. Insomnia is more common in people with lower income.  Lack of exercise.  Irregular work schedule or night shifts.  Traveling between different time zones.  What are the signs or symptoms? If you have insomnia, trouble falling asleep or trouble staying asleep is the main symptom. This may lead to other symptoms, such as:  Feeling fatigued.  Feeling nervous about going to sleep.  Not feeling rested in the morning.  Having trouble concentrating.  Feeling irritable, anxious, or depressed.  How is this treated? Treatment for insomnia depends on the cause. If your insomnia is caused by an underlying condition, treatment will focus on addressing the condition. Treatment may also include:  Medicines to help you sleep.  Counseling or therapy.  Lifestyle adjustments.  Follow these instructions at home:  Take medicines only as directed by your health care provider.  Keep regular sleeping and waking hours. Avoid naps.  Keep a sleep diary to help you and your health care provider figure out what could be causing your insomnia. Include: ? When you sleep. ? When you wake up during the night. ? How well you sleep. ? How rested you feel the next day. ? Any side effects of medicines you are taking. ? What you eat and drink.  Make your bedroom a comfortable place where it is easy to fall asleep: ? Put up shades or special blackout curtains to block light from outside. ? Use a white noise machine to block noise. ? Keep the temperature cool.  Exercise regularly as directed by your health care provider. Avoid exercising right before bedtime.  Use relaxation techniques to manage stress. Ask your health care provider to suggest some techniques that may work well for you. These may include: ? Breathing exercises. ? Routines to release muscle  tension. ? Visualizing peaceful scenes.  Cut back on alcohol, caffeinated beverages, and cigarettes, especially close to bedtime. These can disrupt your sleep.  Do not overeat or eat spicy foods right before bedtime. This can lead to digestive discomfort that can make it hard for you to sleep.  Limit screen use before bedtime. This includes: ? Watching TV. ? Using your smartphone, tablet, and computer.  Stick to a routine. This can help you fall asleep faster. Try to do a quiet activity, brush your teeth, and go to bed at the same time each night.  Get out of bed if you are still awake after 15 minutes of trying to sleep. Keep the lights down, but try reading or doing a quiet activity. When you feel sleepy, go back to bed.  Make sure that you drive carefully. Avoid driving if you feel very sleepy.  Keep all follow-up appointments as directed by your health care provider. This is important. Contact a health care provider if:  You are tired throughout the day or have trouble in your daily routine  due to sleepiness.  You continue to have sleep problems or your sleep problems get worse. Get help right away if:  You have serious thoughts about hurting yourself or someone else. This information is not intended to replace advice given to you by your health care provider. Make sure you discuss any questions you have with your health care provider. Document Released: 11/22/2000 Document Revised: 04/26/2016 Document Reviewed: 08/26/2014 Elsevier Interactive Patient Education  [employer].

## 2017-07-04 DIAGNOSIS — M545 Low back pain: Secondary | ICD-10-CM | POA: Diagnosis not present

## 2017-07-04 DIAGNOSIS — G4489 Other headache syndrome: Secondary | ICD-10-CM | POA: Diagnosis not present

## 2017-07-04 DIAGNOSIS — E43 Unspecified severe protein-calorie malnutrition: Secondary | ICD-10-CM | POA: Diagnosis not present

## 2017-07-04 DIAGNOSIS — Z0000 Encounter for general adult medical examination without abnormal findings: Secondary | ICD-10-CM | POA: Diagnosis not present

## 2017-07-04 DIAGNOSIS — I10 Essential (primary) hypertension: Secondary | ICD-10-CM | POA: Diagnosis not present

## 2017-07-04 DIAGNOSIS — M62838 Other muscle spasm: Secondary | ICD-10-CM | POA: Diagnosis not present

## 2017-07-04 DIAGNOSIS — Z6822 Body mass index (BMI) 22.0-22.9, adult: Secondary | ICD-10-CM | POA: Diagnosis not present

## 2017-07-04 DIAGNOSIS — Q150 Congenital glaucoma: Secondary | ICD-10-CM | POA: Diagnosis not present

## 2017-07-04 DIAGNOSIS — J441 Chronic obstructive pulmonary disease with (acute) exacerbation: Secondary | ICD-10-CM | POA: Diagnosis not present

## 2017-07-07 ENCOUNTER — Ambulatory Visit (HOSPITAL_BASED_OUTPATIENT_CLINIC_OR_DEPARTMENT_OTHER): Payer: Medicare HMO | Admitting: Internal Medicine

## 2017-07-07 ENCOUNTER — Encounter: Payer: Self-pay | Admitting: Internal Medicine

## 2017-07-07 ENCOUNTER — Other Ambulatory Visit (HOSPITAL_BASED_OUTPATIENT_CLINIC_OR_DEPARTMENT_OTHER): Payer: Medicare HMO

## 2017-07-07 VITALS — BP 147/89 | HR 79 | Temp 98.6°F | Resp 20 | Ht 67.0 in | Wt 133.4 lb

## 2017-07-07 DIAGNOSIS — E43 Unspecified severe protein-calorie malnutrition: Secondary | ICD-10-CM | POA: Diagnosis not present

## 2017-07-07 DIAGNOSIS — D500 Iron deficiency anemia secondary to blood loss (chronic): Secondary | ICD-10-CM | POA: Diagnosis not present

## 2017-07-07 DIAGNOSIS — J449 Chronic obstructive pulmonary disease, unspecified: Secondary | ICD-10-CM | POA: Diagnosis not present

## 2017-07-07 DIAGNOSIS — D563 Thalassemia minor: Secondary | ICD-10-CM

## 2017-07-07 DIAGNOSIS — D509 Iron deficiency anemia, unspecified: Secondary | ICD-10-CM | POA: Diagnosis not present

## 2017-07-07 DIAGNOSIS — K922 Gastrointestinal hemorrhage, unspecified: Secondary | ICD-10-CM | POA: Diagnosis not present

## 2017-07-07 LAB — CBC WITH DIFFERENTIAL/PLATELET
BASO%: 0.3 % (ref 0.0–2.0)
BASOS ABS: 0 10*3/uL (ref 0.0–0.1)
EOS%: 2.8 % (ref 0.0–7.0)
Eosinophils Absolute: 0.2 10*3/uL (ref 0.0–0.5)
HCT: 40.3 % (ref 38.4–49.9)
HGB: 12.7 g/dL — ABNORMAL LOW (ref 13.0–17.1)
LYMPH%: 25.7 % (ref 14.0–49.0)
MCH: 25.6 pg — ABNORMAL LOW (ref 27.2–33.4)
MCHC: 31.4 g/dL — AB (ref 32.0–36.0)
MCV: 81.5 fL (ref 79.3–98.0)
MONO#: 0.7 10*3/uL (ref 0.1–0.9)
MONO%: 12.1 % (ref 0.0–14.0)
NEUT#: 3.3 10*3/uL (ref 1.5–6.5)
NEUT%: 59.1 % (ref 39.0–75.0)
Platelets: 157 10*3/uL (ref 140–400)
RBC: 4.95 10*6/uL (ref 4.20–5.82)
RDW: 15.4 % — AB (ref 11.0–14.6)
WBC: 5.6 10*3/uL (ref 4.0–10.3)
lymph#: 1.5 10*3/uL (ref 0.9–3.3)

## 2017-07-07 LAB — IRON AND TIBC
%SAT: 19 % — ABNORMAL LOW (ref 20–55)
IRON: 52 ug/dL (ref 42–163)
TIBC: 268 ug/dL (ref 202–409)
UIBC: 216 ug/dL (ref 117–376)

## 2017-07-07 LAB — FERRITIN: FERRITIN: 33 ng/mL (ref 22–316)

## 2017-07-07 NOTE — Progress Notes (Signed)
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  PRIOR THERAPY: Feraheme 510 mg IV 2 doses last dose was given on 08/16/2015.  CURRENT THERAPY: Oral iron tablets over-the-counter.  INTERVAL HISTORY: Vincent [name] 56 y.o. male returns to the clinic today for follow-up visit. The patient is feeling fine today with no specific complaints except for mild fatigue. He denied having any bleeding issues. He has shortness of breath with exertion but no significant chest pain, cough or hemoptysis. He denied having any significant weight loss or night sweats. He is tolerating his treatment with oral iron tablets fairly well. He has no nausea, vomiting, diarrhea or constipation. He is here today for evaluation and repeat blood work.   MEDICAL HISTORY: Past Medical History:  Diagnosis Date  . Anemia   . Blood transfusion without reported diagnosis   . CAP (community acquired pneumonia)   . COPD (chronic obstructive pulmonary disease) ([place])   . Essential hypertension 07/05/2013   Chronic    . Glaucoma   . Hypertension   . Iron deficiency anemia due to chronic blood loss 08/02/2015    9/16 The patient had an iron infusion (Ferumoxytol) this morning and left about a half hour later and was fine. Now he is home and he is breaking out in sweat, nauseated and dizzy when standing. His BP is 160/86 and HR is between 90-96". It was his second and last iron infusion.  Likely side effects w/Ferumoxytol IV.    Marland Kitchen Severe protein-calorie malnutrition ([place])   . Thalassemia minor 08/02/2015  . Tobacco use disorder 04/10/2015    ALLERGIES:  has No Known Allergies.  MEDICATIONS:  Current Outpatient Prescriptions  Medication Sig Dispense Refill  . acetaminophen (TYLENOL) 500 MG chewable  tablet Chew 500 mg by mouth every 6 (six) hours as needed for pain.    Marland Kitchen albuterol (PROVENTIL HFA;VENTOLIN HFA) 108 (90 BASE) MCG/ACT inhaler Inhale 2 puffs into the lungs every 6 (six) hours as needed for wheezing or shortness of breath. 1 Inhaler 2  . atenolol (TENORMIN) 25 MG tablet Take 1 tablet (25 mg total) by mouth daily. 90 tablet 3  . cyclobenzaprine (FLEXERIL) 5 MG tablet Take 1 tablet (5 mg total) by mouth 3 (three) times daily as needed for muscle spasms. 30 tablet 1  . ferrous sulfate 325 (65 FE) MG tablet Take 325 mg by mouth daily with breakfast.    . neomycin-polymyxin-hydrocortisone (CORTISPORIN) 3.5-10000-1 otic suspension Place 3 drops into the right ear 3 (three) times daily. 10 mL 0  . SUMAtriptan (IMITREX) 100 MG tablet TAKE 1 TABLET BY MOUTH EVERY 2 HOURS AS NEEDED FOR MIGRAINE OR HEADACHE. MAY REPEAT IN 2 HOURS  11  . tiotropium (SPIRIVA HANDIHALER) 18 MCG inhalation capsule Place 1 capsule (18 mcg total) into inhaler and inhale daily. 30 capsule 12  . topiramate (TOPAMAX) 50 MG tablet TAKE 1 TABLET BY MOUTH TWICE A DAY 60 tablet 2   No current facility-administered medications for this visit.     SURGICAL HISTORY:  Past Surgical History:  Procedure Laterality Date  . plate in neck    . right shoulder arthroscopy      REVIEW OF SYSTEMS:  A comprehensive review of systems was negative except for: Constitutional: positive for fatigue   PHYSICAL EXAMINATION: General appearance:  alert, cooperative, fatigued and no distress Head: Normocephalic, without obvious abnormality, atraumatic Neck: no adenopathy, no JVD, supple, symmetrical, trachea midline and thyroid not enlarged, symmetric, no tenderness/mass/nodules Lymph nodes: Cervical, supraclavicular, and axillary nodes normal. Resp: clear to auscultation bilaterally Back: symmetric, no curvature. ROM normal. No CVA tenderness. Cardio: regular rate and rhythm, S1, S2 normal, no murmur, click, rub or gallop GI: soft,  non-tender; bowel sounds normal; no masses,  no organomegaly Extremities: extremities normal, atraumatic, no cyanosis or edema  ECOG PERFORMANCE STATUS: 0 - Asymptomatic  Blood pressure (!) 147/89, pulse 79, temperature 98.6 F (37 C), temperature source Oral, resp. rate 20, height 5\' 7"  (1.702 m), weight 133 lb 6.4 oz (60.5 kg), SpO2 100 %.  LABORATORY DATA: Lab Results  Component Value Date   WBC 5.6 07/07/2017   HGB 12.7 (L) 07/07/2017   HCT 40.3 07/07/2017   MCV 81.5 07/07/2017   PLT 157 07/07/2017      Chemistry      Component Value Date/Time   NA 138 07/02/2016 0809   K 3.6 07/02/2016 0809   CL 104 06/18/2016 0840   CO2 20 (L) 07/02/2016 0809   BUN 10.3 07/02/2016 0809   CREATININE 1.2 07/02/2016 0809      Component Value Date/Time   CALCIUM 9.1 07/02/2016 0809   ALKPHOS 67 07/02/2016 0809   AST 42 (H) 07/02/2016 0809   ALT 117 (H) 07/02/2016 0809   BILITOT <0.30 07/02/2016 0809       RADIOGRAPHIC STUDIES: No results found.  ASSESSMENT AND PLAN:  This is a very pleasant 56 years old African-American male with iron deficiency anemia secondary to chronic GI blood loss in addition to thalassemia minor.  The patient tolerating his oral iron tablets fairly well. His hemoglobin and hematocrit are much better than 6 months ago. I recommended for the patient to continue on the oral iron tablets. I will see him back for follow-up visit in 6 months with repeat CBC, iron study and ferritin. He was advised to call immediately if he has any concerning symptoms in the interval. The patient voices understanding of current disease status and treatment options and is in agreement with the current care plan. All questions were answered. The patient knows to call the clinic with any problems, questions or concerns. We can certainly see the patient much sooner if necessary. I spent 10 minutes counseling the patient face to face. The total time spent in the appointment was 15  minutes.  Disclaimer: This note was dictated with voice recognition software. Similar sounding words can inadvertently be transcribed and may not be corrected upon review.

## 2017-07-22 DIAGNOSIS — H53001 Unspecified amblyopia, right eye: Secondary | ICD-10-CM | POA: Diagnosis not present

## 2017-07-22 DIAGNOSIS — H2701 Aphakia, right eye: Secondary | ICD-10-CM | POA: Diagnosis not present

## 2017-07-22 DIAGNOSIS — H4720 Unspecified optic atrophy: Secondary | ICD-10-CM | POA: Diagnosis not present

## 2017-07-22 DIAGNOSIS — H401113 Primary open-angle glaucoma, right eye, severe stage: Secondary | ICD-10-CM | POA: Diagnosis not present

## 2017-07-22 DIAGNOSIS — H2512 Age-related nuclear cataract, left eye: Secondary | ICD-10-CM | POA: Diagnosis not present

## 2017-07-25 ENCOUNTER — Other Ambulatory Visit: Payer: Self-pay | Admitting: Internal Medicine

## 2017-08-26 DIAGNOSIS — H2701 Aphakia, right eye: Secondary | ICD-10-CM | POA: Diagnosis not present

## 2017-08-26 DIAGNOSIS — H2512 Age-related nuclear cataract, left eye: Secondary | ICD-10-CM | POA: Diagnosis not present

## 2017-08-26 DIAGNOSIS — H4720 Unspecified optic atrophy: Secondary | ICD-10-CM | POA: Diagnosis not present

## 2017-08-26 DIAGNOSIS — H53001 Unspecified amblyopia, right eye: Secondary | ICD-10-CM | POA: Diagnosis not present

## 2017-08-26 DIAGNOSIS — H401113 Primary open-angle glaucoma, right eye, severe stage: Secondary | ICD-10-CM | POA: Diagnosis not present

## 2017-08-28 ENCOUNTER — Ambulatory Visit: Payer: Medicare HMO | Admitting: Internal Medicine

## 2017-10-17 ENCOUNTER — Other Ambulatory Visit: Payer: Self-pay

## 2017-10-17 ENCOUNTER — Encounter: Payer: Self-pay | Admitting: Internal Medicine

## 2017-10-17 ENCOUNTER — Ambulatory Visit (INDEPENDENT_AMBULATORY_CARE_PROVIDER_SITE_OTHER): Payer: Medicare HMO | Admitting: Internal Medicine

## 2017-10-17 VITALS — BP 140/72 | HR 73 | Temp 98.0°F | Ht 67.0 in | Wt 133.0 lb

## 2017-10-17 DIAGNOSIS — Z23 Encounter for immunization: Secondary | ICD-10-CM

## 2017-10-17 DIAGNOSIS — M79602 Pain in left arm: Secondary | ICD-10-CM

## 2017-10-17 DIAGNOSIS — R200 Anesthesia of skin: Secondary | ICD-10-CM

## 2017-10-17 MED ORDER — GABAPENTIN 100 MG PO CAPS: 100.0000 mg | ORAL_CAPSULE | Freq: Three times a day (TID) | ORAL | 3 refills | Status: DC

## 2017-10-17 MED ORDER — ALBUTEROL SULFATE HFA 108 (90 BASE) MCG/ACT IN AERS: 2.0000 | INHALATION_SPRAY | Freq: Four times a day (QID) | RESPIRATORY_TRACT | 0 refills | Status: DC | PRN

## 2017-10-17 NOTE — Patient Instructions (Signed)
We are getting you in with the neck specialist and have sent in gabapentin for the pain. You can take 1 pill 3 times per day.

## 2017-10-17 NOTE — Assessment & Plan Note (Signed)
Referral to neurosurgery given significant findings on prior cervical MRI and consistency with symptoms of cervical or shoulder nerve impingement. Rx for gabapentin for pain today as well.

## 2017-10-17 NOTE — Progress Notes (Signed)
   Subjective:    Patient ID: Vincent [name], male    DOB: Feb 28, 1961, 56 y.o.   MRN: [id number]  HPI The patient is a 56 YO man coming in for left arm pain and numbness. Started about 1 month ago. Stinging pain with certain movements. Denies injury or overuse. He has not tried anything for it. He does have cervical neck surgery and fusion with C4-C7. Prior MRI from 2014 with signs of nerve impingement. Prior shoulder surgery as well on the left shoulder. He denies chest pain or tenderness or palpitations.  Review of Systems  Constitutional: Negative.   Respiratory: Negative.   Cardiovascular: Negative.   Gastrointestinal: Negative.   Musculoskeletal: Positive for arthralgias and myalgias.  Skin: Negative.   Neurological: Positive for numbness and headaches. Negative for dizziness, seizures, weakness and light-headedness.      Objective:   Physical Exam  Constitutional: He appears well-developed and well-nourished.  HENT:  Head: Normocephalic and atraumatic.  Eyes: EOM are normal.  Neck: Neck supple. No JVD present.  Cardiovascular: Normal rate and regular rhythm.  Pulmonary/Chest: Effort normal.  Abdominal: Soft.  Musculoskeletal:  Some pain midline cervical and biceps and triceps. Numbness in the fingers and wrist which is worse than usual.   Lymphadenopathy:    He has no cervical adenopathy.  Skin: Skin is warm and dry.   Vitals:   10/17/17 0822  BP: 140/72  Pulse: 73  Temp: 98 F (36.7 C)  TempSrc: Oral  SpO2: 99%  Weight: 133 lb (60.3 kg)  Height: 5\' 7"  (1.702 m)      Assessment & Plan:  Flu shot given at visit

## 2017-10-27 ENCOUNTER — Other Ambulatory Visit: Payer: Self-pay | Admitting: Internal Medicine

## 2017-11-04 ENCOUNTER — Encounter: Payer: Self-pay | Admitting: Internal Medicine

## 2017-11-10 ENCOUNTER — Other Ambulatory Visit: Payer: Self-pay | Admitting: Internal Medicine

## 2018-01-06 ENCOUNTER — Telehealth: Payer: Self-pay | Admitting: Internal Medicine

## 2018-01-06 NOTE — Telephone Encounter (Signed)
Patient left a voicemail regarding rescheduling with the same time. I rescheduled to March

## 2018-01-07 ENCOUNTER — Ambulatory Visit: Payer: Medicare HMO | Admitting: Internal Medicine

## 2018-01-07 ENCOUNTER — Other Ambulatory Visit: Payer: Medicare HMO

## 2018-02-09 ENCOUNTER — Encounter: Payer: Self-pay | Admitting: Family

## 2018-02-09 ENCOUNTER — Ambulatory Visit: Payer: Self-pay

## 2018-02-09 ENCOUNTER — Ambulatory Visit (INDEPENDENT_AMBULATORY_CARE_PROVIDER_SITE_OTHER)
Admission: RE | Admit: 2018-02-09 | Discharge: 2018-02-09 | Disposition: A | Discharge status: Home / Self Care | Payer: Medicare HMO | Source: Ambulatory Visit | Attending: Family | Admitting: Family

## 2018-02-09 ENCOUNTER — Ambulatory Visit (INDEPENDENT_AMBULATORY_CARE_PROVIDER_SITE_OTHER): Payer: Medicare HMO | Admitting: Family

## 2018-02-09 VITALS — BP 130/70 | HR 94 | Temp 98.8°F | Ht 67.0 in | Wt 134.0 lb

## 2018-02-09 DIAGNOSIS — R0602 Shortness of breath: Secondary | ICD-10-CM | POA: Diagnosis not present

## 2018-02-09 DIAGNOSIS — J441 Chronic obstructive pulmonary disease with (acute) exacerbation: Secondary | ICD-10-CM

## 2018-02-09 DIAGNOSIS — R05 Cough: Secondary | ICD-10-CM

## 2018-02-09 DIAGNOSIS — J209 Acute bronchitis, unspecified: Secondary | ICD-10-CM | POA: Diagnosis not present

## 2018-02-09 DIAGNOSIS — R059 Cough, unspecified: Secondary | ICD-10-CM

## 2018-02-09 MED ORDER — PREDNISONE 20 MG PO TABS: 40.0000 mg | ORAL_TABLET | Freq: Every day | ORAL | 0 refills | Status: AC

## 2018-02-09 MED ORDER — AMOXICILLIN-POT CLAVULANATE 875-125 MG PO TABS: 1.0000 | ORAL_TABLET | Freq: Two times a day (BID) | ORAL | 0 refills | Status: DC

## 2018-02-09 MED ORDER — IPRATROPIUM-ALBUTEROL 0.5-2.5 (3) MG/3ML IN SOLN
3.0000 mL | Freq: Once | RESPIRATORY_TRACT | Status: AC
  Administered 2018-02-09: 3 mL via RESPIRATORY_TRACT

## 2018-02-09 NOTE — Progress Notes (Signed)
Vincent [name] is a 57 y.o. male with the following history as recorded in EpicCare:  Patient Active Problem List   Diagnosis Date Noted  . Left arm pain 10/17/2017  . Acute hearing loss, right 10/04/2016  . COPD (chronic obstructive pulmonary disease) ([place]) 05/09/2016  . Bilateral foot pain 05/09/2016  . Daily headache 05/09/2016  . Arthritis 08/03/2015  . Iron deficiency anemia due to chronic blood loss 08/02/2015  . Thalassemia minor 08/02/2015  . COPD exacerbation ([place]) 04/10/2015  . Frequent nosebleeds 04/07/2015  . Loss of weight 04/07/2015  . Acute blood loss anemia 04/07/2015  . Routine general medical examination at a health care facility 09/21/2014  . Neck pain 07/05/2013  . Essential hypertension 07/05/2013  . Former smoker 07/05/2013    Current Outpatient Medications  Medication Sig Dispense Refill  . albuterol (PROVENTIL HFA;VENTOLIN HFA) 108 (90 Base) MCG/ACT inhaler Inhale 2 puffs every 6 (six) hours as needed into the lungs for wheezing or shortness of breath. Needs annual visit for further refills 1 Inhaler 0  . atenolol (TENORMIN) 25 MG tablet TAKE 1 TABLET BY MOUTH DAILY. KEEP 9/20 APPOINTMENT FOR FURTHER REFILLS 90 tablet 1  . ferrous sulfate 325 (65 FE) MG tablet Take 325 mg by mouth daily with breakfast.    . latanoprost (XALATAN) 0.005 % ophthalmic solution INSTILL 1 DROP IN RIGHT EYE NIGHTLY  12  . SUMAtriptan (IMITREX) 100 MG tablet TAKE 1 TABLET BY MOUTH EVERY 2 HOURS AS NEEDED FOR MIGRAINE OR HEADACHE. MAY REPEAT IN 2 HOURS  11  . tiotropium (SPIRIVA HANDIHALER) 18 MCG inhalation capsule Place 1 capsule (18 mcg total) into inhaler and inhale daily. 30 capsule 12  . topiramate (TOPAMAX) 50 MG tablet TAKE 1 TABLET BY MOUTH TWICE A DAY 60 tablet 2  . amoxicillin-clavulanate (AUGMENTIN) 875-125 MG tablet Take 1 tablet by mouth 2 (two) times daily. 20 tablet 0  . predniSONE (DELTASONE) 20 MG tablet Take 2 tablets (40 mg total) by mouth daily with breakfast for 5  days. 10 tablet 0   Current Facility-Administered Medications  Medication Dose Route Frequency Provider Last Rate Last Dose  . ipratropium-albuterol (DUONEB) 0.5-2.5 (3) MG/3ML nebulizer solution 3 mL  3 mL Nebulization Once Marrian Salvage, FNP        Allergies: Patient has no known allergies.  Past Medical History:  Diagnosis Date  . Anemia   . Blood transfusion without reported diagnosis   . CAP (community acquired pneumonia)   . COPD (chronic obstructive pulmonary disease) ([place])   . Essential hypertension 07/05/2013   Chronic    . Glaucoma   . Hypertension   . Iron deficiency anemia due to chronic blood loss 08/02/2015    9/16 The patient had an iron infusion (Ferumoxytol) this morning and left about a half hour later and was fine. Now he is home and he is breaking out in sweat, nauseated and dizzy when standing. His BP is 160/86 and HR is between 90-96". It was his second and last iron infusion.  Likely side effects w/Ferumoxytol IV.    Marland Kitchen Severe protein-calorie malnutrition ([place])   . Thalassemia minor 08/02/2015  . Tobacco use disorder 04/10/2015    Past Surgical History:  Procedure Laterality Date  . plate in neck    . right shoulder arthroscopy      Family History  Problem Relation Age of Onset  . Heart disease Father   . Sickle cell anemia Mother        deceased  .  Diabetes Sister     Social History   Tobacco Use  . Smoking status: Former Smoker    Packs/day: 0.50    Years: 40.00    Pack years: 20.00    Types: Cigarettes    Last attempt to quit: 03/10/2015    Years since quitting: 2.9  . Smokeless tobacco: Never Used  Substance Use Topics  . Alcohol use: Yes    Alcohol/week: 0.0 oz    Comment: occasionally     Subjective:  Patient presents with concerns for COPD exacerbation; has felt need to use his rescue inhaler more frequently in the past week; no sinus pain/ pressure; no sore throat; using Claritin D and Mucinex with no benefit; has been seen in the ER  on numerous occasions with bronchitis/ pneumonia; also feels that both of his ears are "stopped up."  Objective:  Vitals:   02/09/18 1044  BP: 130/70  Pulse: 94  Temp: 98.8 F (37.1 C)  TempSrc: Oral  SpO2: 100%  Weight: 134 lb 0.6 oz (60.8 kg)  Height: 5\' 7"  (1.702 m)    General: Well developed, well nourished, in no acute distress  Skin : Warm and dry.  Head: Normocephalic and atraumatic  Eyes: Sclera and conjunctiva clear; pupils round and reactive to light; extraocular movements intact  Ears: External normal; canals clear; tympanic membranes congested Oropharynx: Pink, supple. No suspicious lesions  Neck: Supple without thyromegaly, adenopathy  Lungs: Respirations unlabored; coarse breath sounds x 4 lobes;  CVS exam: normal rate and regular rhythm.  Vessels: Symmetric bilaterally  Neurologic: Alert and oriented; speech intact; face symmetrical; moves all extremities well; CNII-XII intact without focal deficit   Assessment:  1. Cough   2. Acute bronchitis, unspecified organism   3. COPD exacerbation ([place])     Plan:  Duo-neb given in office with benefit; update CXR today; start Augmentin 875 mg bid x 10 days; Rx for Prednisone 20 mg 2 po qd x 5 days; encouraged to use albuterol inhaler q 4-6 hours prn at least for the next 2-3 days; increase fluids, rest and follow-up to be determined based on CXR results.   No Follow-up on file.  Orders Placed This Encounter  Procedures  . DG Chest 2 View    Standing Status:   Future    Number of Occurrences:   1    Standing Expiration Date:   04/12/2019    Order Specific Question:   Reason for Exam (SYMPTOM  OR DIAGNOSIS REQUIRED)    Answer:   cough    Order Specific Question:   Preferred imaging location?    Answer:   [facility]    Order Specific Question:   Radiology Contrast Protocol - do NOT remove file path    Answer:   [email or web address]    Requested Prescriptions   Signed  Prescriptions Disp Refills  . predniSONE (DELTASONE) 20 MG tablet 10 tablet 0    Sig: Take 2 tablets (40 mg total) by mouth daily with breakfast for 5 days.  Marland Kitchen amoxicillin-clavulanate (AUGMENTIN) 875-125 MG tablet 20 tablet 0    Sig: Take 1 tablet by mouth 2 (two) times daily.

## 2018-02-09 NOTE — Telephone Encounter (Signed)
Patient seen in office this morning 

## 2018-02-09 NOTE — Telephone Encounter (Signed)
Pt. Called to report shortness of breath and wheezing that has been going on for about one week.  Stated it is worse at night. Stated the shortness of breath comes and goes.  Stated "it feels like my lungs are filling up with fluid. Reported coughing up white phlegm, has intermittent chills, and c/o chest soreness from coughing. Has not checked temperature.  Appt. Scheduled this morning with PCP office.  Pt. Agrees with plan.      Reason for Disposition . [1] MILD difficulty breathing (e.g., minimal/no SOB at rest, SOB with walking, pulse <100) AND [2] NEW-onset or WORSE than normal  Answer Assessment - Initial Assessment Questions 1. RESPIRATORY STATUS: "Describe your breathing?" (e.g., wheezing, shortness of breath, unable to speak, severe coughing)      Feeling short of breath and wheezing; worse at night; feels like my lungs are filling up with fluid; feels a little better this morning comparing to last night  2. ONSET: "When did this breathing problem begin?"      About a week ago 3. PATTERN "Does the difficult breathing come and go, or has it been constant since it started?"      Comes and goes 4. SEVERITY: "How bad is your breathing?" (e.g., mild, moderate, severe)    - MILD: No SOB at rest, mild SOB with walking, speaks normally in sentences, can lay down, no retractions, pulse < 100.    - MODERATE: SOB at rest, SOB with minimal exertion and prefers to sit, cannot lie down flat, speaks in phrases, mild retractions, audible wheezing, pulse 100-120.    - SEVERE: Very SOB at rest, speaks in single words, struggling to breathe, sitting hunched forward, retractions, pulse > 120      Moderate to severe; It can get severe at times  5. RECURRENT SYMPTOM: "Have you had difficulty breathing before?" If so, ask: "When was the last time?" and "What happened that time?"      Had pneumonia about 2 yrs ago  6. CARDIAC HISTORY: "Do you have any history of heart disease?" (e.g., heart attack, angina,  bypass surgery, angioplasty)      no 7. LUNG HISTORY: "Do you have any history of lung disease?"  (e.g., pulmonary embolus, asthma, emphysema)    COPD  8. CAUSE: "What do you think is causing the breathing problem?"      unknown 9. OTHER SYMPTOMS: "Do you have any other symptoms? (e.g., dizziness, runny nose, cough, chest pain, fever)    Coughing up phlegm; white , nasal congestion, some chills, chest is sore from coughing  10. PREGNANCY: "Is there any chance you are pregnant?" "When was your last menstrual period?"       n/a 11. TRAVEL: "Have you traveled out of the country in the last month?" (e.g., travel history, exposures)       No  Protocols used: BREATHING DIFFICULTY-A-AH

## 2018-02-23 ENCOUNTER — Encounter: Payer: Self-pay | Admitting: Internal Medicine

## 2018-02-23 ENCOUNTER — Telehealth: Payer: Self-pay | Admitting: Internal Medicine

## 2018-02-23 ENCOUNTER — Telehealth: Payer: Self-pay

## 2018-02-23 ENCOUNTER — Inpatient Hospital Stay: Payer: Medicare HMO | Attending: Internal Medicine

## 2018-02-23 ENCOUNTER — Inpatient Hospital Stay (HOSPITAL_BASED_OUTPATIENT_CLINIC_OR_DEPARTMENT_OTHER): Payer: Medicare HMO | Admitting: Internal Medicine

## 2018-02-23 VITALS — BP 175/97 | HR 115 | Temp 99.3°F | Resp 20 | Ht 67.0 in | Wt 124.8 lb

## 2018-02-23 DIAGNOSIS — J449 Chronic obstructive pulmonary disease, unspecified: Secondary | ICD-10-CM | POA: Diagnosis not present

## 2018-02-23 DIAGNOSIS — I10 Essential (primary) hypertension: Secondary | ICD-10-CM | POA: Diagnosis not present

## 2018-02-23 DIAGNOSIS — K921 Melena: Secondary | ICD-10-CM | POA: Insufficient documentation

## 2018-02-23 DIAGNOSIS — K922 Gastrointestinal hemorrhage, unspecified: Secondary | ICD-10-CM | POA: Diagnosis not present

## 2018-02-23 DIAGNOSIS — H409 Unspecified glaucoma: Secondary | ICD-10-CM | POA: Diagnosis not present

## 2018-02-23 DIAGNOSIS — Z79899 Other long term (current) drug therapy: Secondary | ICD-10-CM | POA: Insufficient documentation

## 2018-02-23 DIAGNOSIS — D563 Thalassemia minor: Secondary | ICD-10-CM | POA: Insufficient documentation

## 2018-02-23 DIAGNOSIS — D500 Iron deficiency anemia secondary to blood loss (chronic): Secondary | ICD-10-CM | POA: Diagnosis not present

## 2018-02-23 DIAGNOSIS — R531 Weakness: Secondary | ICD-10-CM | POA: Insufficient documentation

## 2018-02-23 DIAGNOSIS — D62 Acute posthemorrhagic anemia: Secondary | ICD-10-CM

## 2018-02-23 LAB — CBC WITH DIFFERENTIAL/PLATELET
BASOS PCT: 1 %
Basophils Absolute: 0.2 10*3/uL — ABNORMAL HIGH (ref 0.0–0.1)
Eosinophils Absolute: 0 10*3/uL (ref 0.0–0.5)
Eosinophils Relative: 0 %
HEMATOCRIT: 32.5 % — AB (ref 38.4–49.9)
HEMOGLOBIN: 9.7 g/dL — AB (ref 13.0–17.1)
LYMPHS PCT: 15 %
Lymphs Abs: 2.2 10*3/uL (ref 0.9–3.3)
MCH: 20.6 pg — ABNORMAL LOW (ref 27.2–33.4)
MCHC: 29.8 g/dL — ABNORMAL LOW (ref 32.0–36.0)
MCV: 69.1 fL — AB (ref 79.3–98.0)
Monocytes Absolute: 1.5 10*3/uL — ABNORMAL HIGH (ref 0.1–0.9)
Monocytes Relative: 10 %
NEUTROS ABS: 10.6 10*3/uL — AB (ref 1.5–6.5)
NEUTROS PCT: 74 %
Platelets: 168 10*3/uL (ref 140–400)
RBC: 4.71 MIL/uL (ref 4.20–5.82)
RDW: 21.4 % — AB (ref 11.0–14.6)
WBC: 14.5 10*3/uL — AB (ref 4.0–10.3)

## 2018-02-23 LAB — IRON AND TIBC
Iron: 396 ug/dL — ABNORMAL HIGH (ref 42–163)
Saturation Ratios: 105 % (ref 42–163)
TIBC: 376 ug/dL (ref 202–409)
UIBC: UNDETERMINED ug/dL

## 2018-02-23 LAB — FERRITIN: Ferritin: 41 ng/mL (ref 22–316)

## 2018-02-23 NOTE — Progress Notes (Signed)
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  PRIOR THERAPY: Feraheme 510 mg IV 2 doses last dose was given on 08/16/2015.  CURRENT THERAPY: Oral iron tablets over-the-counter.  INTERVAL HISTORY: Vincent [name] 57 y.o. male returns to the clinic today for follow-up visit accompanied by his wife.  The patient has been complaining of increasing fatigue and weakness recently.  He mentions that he has a big bowel movement earlier today and it was dark and bloody.  He has a history of hemorrhoid in the past.  Has been taking his oral iron tablets at regular basis.  He denied having any dizzy spells.  He denied having any recent chest pain but has shortness of breath at baseline increased with exertion.  Recent chest x-ray showed evidence for COPD.  The patient denied having any fever or chills.  He has no nausea, vomiting or constipation.  He is here today for evaluation with repeat CBC, iron study and ferritin.   MEDICAL HISTORY: Past Medical History:  Diagnosis Date  . Anemia   . Blood transfusion without reported diagnosis   . CAP (community acquired pneumonia)   . COPD (chronic obstructive pulmonary disease) ([place])   . Essential hypertension 07/05/2013   Chronic    . Glaucoma   . Hypertension   . Iron deficiency anemia due to chronic blood loss 08/02/2015    9/16 The patient had an iron infusion (Ferumoxytol) this morning and left about a half hour later and was fine. Now he is home and he is breaking out in sweat, nauseated and dizzy when standing. His BP is 160/86 and HR is between 90-96". It was his second and last iron infusion.  Likely side effects w/Ferumoxytol IV.    Marland Kitchen Severe protein-calorie malnutrition ([place])   . Thalassemia minor 08/02/2015  . Tobacco  use disorder 04/10/2015    ALLERGIES:  has No Known Allergies.  MEDICATIONS:  Current Outpatient Medications  Medication Sig Dispense Refill  . albuterol (PROVENTIL HFA;VENTOLIN HFA) 108 (90 Base) MCG/ACT inhaler Inhale 2 puffs every 6 (six) hours as needed into the lungs for wheezing or shortness of breath. Needs annual visit for further refills 1 Inhaler 0  . amoxicillin-clavulanate (AUGMENTIN) 875-125 MG tablet Take 1 tablet by mouth 2 (two) times daily. 20 tablet 0  . atenolol (TENORMIN) 25 MG tablet TAKE 1 TABLET BY MOUTH DAILY. KEEP 9/20 APPOINTMENT FOR FURTHER REFILLS 90 tablet 1  . ferrous sulfate 325 (65 FE) MG tablet Take 325 mg by mouth daily with breakfast.    . latanoprost (XALATAN) 0.005 % ophthalmic solution INSTILL 1 DROP IN RIGHT EYE NIGHTLY  12  . SUMAtriptan (IMITREX) 100 MG tablet TAKE 1 TABLET BY MOUTH EVERY 2 HOURS AS NEEDED FOR MIGRAINE OR HEADACHE. MAY REPEAT IN 2 HOURS  11  . tiotropium (SPIRIVA HANDIHALER) 18 MCG inhalation capsule Place 1 capsule (18 mcg total) into inhaler and inhale daily. 30 capsule 12  . topiramate (TOPAMAX) 50 MG tablet TAKE 1 TABLET BY MOUTH TWICE A DAY 60 tablet 2   Current Facility-Administered Medications  Medication Dose Route Frequency Provider Last Rate Last Dose  . ipratropium-albuterol (DUONEB) 0.5-2.5 (3) MG/3ML nebulizer solution 3 mL  3 mL Nebulization Once Marrian Salvage, FNP        SURGICAL HISTORY:  Past Surgical History:  Procedure Laterality Date  . plate in neck    . right shoulder arthroscopy      REVIEW OF SYSTEMS:  A comprehensive review of systems was negative except for: Constitutional: positive for fatigue Gastrointestinal: positive for melena   PHYSICAL EXAMINATION: General appearance: alert, cooperative, fatigued and no distress Head: Normocephalic, without obvious abnormality, atraumatic Neck: no adenopathy, no JVD, supple, symmetrical, trachea midline and thyroid not enlarged, symmetric, no  tenderness/mass/nodules Lymph nodes: Cervical, supraclavicular, and axillary nodes normal. Resp: clear to auscultation bilaterally Back: symmetric, no curvature. ROM normal. No CVA tenderness. Cardio: regular rate and rhythm, S1, S2 normal, no murmur, click, rub or gallop GI: soft, non-tender; bowel sounds normal; no masses,  no organomegaly Extremities: extremities normal, atraumatic, no cyanosis or edema  ECOG PERFORMANCE STATUS: 1 - Symptomatic but completely ambulatory  There were no vitals taken for this visit.  LABORATORY DATA: Lab Results  Component Value Date   WBC 14.5 (H) 02/23/2018   HGB 9.7 (L) 02/23/2018   HCT 32.5 (L) 02/23/2018   MCV 69.1 (L) 02/23/2018   PLT 168 02/23/2018      Chemistry      Component Value Date/Time   NA 138 07/02/2016 0809   K 3.6 07/02/2016 0809   CL 104 06/18/2016 0840   CO2 20 (L) 07/02/2016 0809   BUN 10.3 07/02/2016 0809   CREATININE 1.2 07/02/2016 0809      Component Value Date/Time   CALCIUM 9.1 07/02/2016 0809   ALKPHOS 67 07/02/2016 0809   AST 42 (H) 07/02/2016 0809   ALT 117 (H) 07/02/2016 0809   BILITOT <0.30 07/02/2016 0809     Serum iron 396, total iron binding capacity 376, iron saturation 105%, ferritin level 41.  RADIOGRAPHIC STUDIES: Dg Chest 2 View  Result Date: 02/09/2018 CLINICAL DATA:  Cough, shortness of breath, low-grade fever, congestion EXAM: CHEST  2 VIEW COMPARISON:  05/01/2015 FINDINGS: There is hyperinflation of the lungs compatible with COPD. Heart and mediastinal contours are within normal limits. No focal opacities or effusions. No acute bony abnormality. IMPRESSION: Hyperinflation/COPD.  No active disease. Electronically Signed   By: Rolm Baptise M.D.   On: 02/09/2018 12:08    ASSESSMENT AND PLAN:  This is a very pleasant 57 years old African-American male with iron deficiency anemia secondary to chronic GI blood loss in addition to thalassemia minor.  The patient has been on oral iron tablets  recently and has been tolerating his treatment well. Repeat CBC earlier today showed significant drop in his hemoglobin and hematocrit and the patient had a bloody bowel movement earlier today. I recommended for him to see his gastroenterologist as soon as possible and I made a referral for the patient to see Dr. Elmo Putt. Iron study is unremarkable today.  The patient will not need any iron infusion at this point. I will see him back for follow-up visit in 3 months for evaluation with repeat CBC, iron study and ferritin. He was advised to call immediately if he has any concerning symptoms in the interval. The patient voices understanding of current disease status and treatment options and is in agreement with the current care plan. All questions were answered. The patient knows to call the clinic with any problems, questions or concerns. We can certainly see the patient much sooner if necessary. I spent 10 minutes counseling the patient face to face. The total time spent in the appointment was 15 minutes.  Disclaimer: This note was dictated with voice  recognition software. Similar sounding words can inadvertently be transcribed and may not be corrected upon review.

## 2018-02-23 NOTE — Telephone Encounter (Signed)
Patient was seen in office on 02/09/18 but Duo medication was not documented as being given. Documented duo meds today under MAR that was still showing up past due for documentation. Completed in Epic today.

## 2018-02-23 NOTE — Telephone Encounter (Signed)
Appointments scheduled AVS/Calendar printed per 3/18 los °

## 2018-02-23 NOTE — Telephone Encounter (Signed)
Document patient received duo-neb treatment back on 02/09/18 with Mickel Baas.

## 2018-03-10 ENCOUNTER — Ambulatory Visit: Payer: Medicare HMO | Admitting: Nurse Practitioner

## 2018-03-10 ENCOUNTER — Encounter: Payer: Self-pay | Admitting: Nurse Practitioner

## 2018-03-10 VITALS — BP 126/74 | HR 84 | Ht 67.0 in | Wt 125.4 lb

## 2018-03-10 DIAGNOSIS — K625 Hemorrhage of anus and rectum: Secondary | ICD-10-CM | POA: Diagnosis not present

## 2018-03-10 DIAGNOSIS — K648 Other hemorrhoids: Secondary | ICD-10-CM

## 2018-03-10 MED ORDER — HYDROCORTISONE 2.5 % RE CREA: 1.0000 "application " | TOPICAL_CREAM | Freq: Every day | RECTAL | 0 refills | Status: DC

## 2018-03-10 NOTE — Progress Notes (Addendum)
IMPRESSION and PLAN:    Recurrent painless rectal bleeding, recurrent anemia. Suspect internal hemorrhoids source of the bleeding as he has very large ones on anoscopy. He has a hx of colonic AVMs but still suspect hemorrhoids source.  -we discussed banding and he is interested. Banding brochure given. Will schedule patient to see Dr. Hilarie Fredrickson and proceed with banding on same day. If no timely appts available will go ahead and treat him for a week with steroidal cream to provide some immediate relief  Weight loss, unintentional. Weight down 8-9 pounds with associated N/V or abdominal pain. He says his weight fluctuates. He was struggling with weight loss when we saw him in 2016. No need for intervention at this point.    Addendum: Reviewed and agree with initial management. Pyrtle, Lajuan Lines, MD     HPI:    Chief Complaint: loose stool and rectal bleeding.  Patient is a 57 year old male seen August 2016 for rectal bleeding . Had subsequently underwent colonoscopy October 2016 for evaluation of the rectal bleeding and iron deficiency anemia.  Exam was complete but prep only fair.  Findings included moderate to large internal hemorrhoids, small sessile polyps ( hyperplastic) and 2 angiodysplastic lesions.  He also underwent EGD for evaluation of iron deficiency anemia.  Severe acute gastritis was found, H. pylori positive on biopsy.   Patient presents with loose stool with blood and recurrent iron deficiency anemia.  Hemoglobin July 2018 was 12.7, it is now down to 9.6 with low MCV, ferritin normal though at 41.  Appetite is not great. Occasionally feels tired but no SOB. Stools are dark brown ( on chronic iron). No melena. Stool is loose but only once a day (usually in the am ). Sees bright red blood in stool almost daily for a month now. Blood only with defecation once a day, no other time. No rectal pain.  No abdominal pain. No nausea / vomiting.  No NSAID use.  Review of systems:   No  dizziness, no sob, no urinary sx    Past Medical History:  Diagnosis Date  . Anemia   . Blood transfusion without reported diagnosis   . CAP (community acquired pneumonia)   . COPD (chronic obstructive pulmonary disease) ([place])   . Essential hypertension 07/05/2013   Chronic    . Glaucoma   . Hypertension   . Iron deficiency anemia due to chronic blood loss 08/02/2015    9/16 The patient had an iron infusion (Ferumoxytol) this morning and left about a half hour later and was fine. Now he is home and he is breaking out in sweat, nauseated and dizzy when standing. His BP is 160/86 and HR is between 90-96". It was his second and last iron infusion.  Likely side effects w/Ferumoxytol IV.    Marland Kitchen Severe protein-calorie malnutrition ([place])   . Thalassemia minor 08/02/2015  . Tobacco use disorder 04/10/2015    Patient's surgical history, family medical history, social history, medications and allergies were all reviewed in Epic    Physical Exam:     BP 126/74   Pulse 84   Ht 5\' 7"  (1.702 m)   Wt 125 lb 6.4 oz (56.9 kg)   BMI 19.64 kg/m   GENERAL:  Thin black male in NAD PSYCH: :Pleasant, cooperative, normal affect EENT:  conjunctiva pink, mucous membranes moist, neck supple without masses CARDIAC:  RRR, no murmur heard, no peripheral edema PULM: Normal respiratory effort, lungs CTA bilaterally, no wheezing ABDOMEN:  Nondistended, soft, nontender. No obvious masses, no hepatomegaly,  normal bowel sounds Rectal : large internal hemorrhoids on anoscopy.  SKIN:  turgor, no lesions seen Musculoskeletal:  Normal muscle tone, normal strength NEURO: Alert and oriented x 3, no focal neurologic deficits   Tye Savoy , NP 03/10/2018, 9:01 AM

## 2018-03-10 NOTE — Patient Instructions (Addendum)
If you are age 57 or older, your body mass index should be between 23-30. Your Body mass index is 19.64 kg/m. If this is out of the aforementioned range listed, please consider follow up with your Primary Care Provider.  If you are age 63 or younger, your body mass index should be between 19-25. Your Body mass index is 19.64 kg/m. If this is out of the aformentioned range listed, please consider follow up with your Primary Care Provider.   Thank you for choosing [facility], Tye Savoy, NP

## 2018-03-11 ENCOUNTER — Ambulatory Visit (INDEPENDENT_AMBULATORY_CARE_PROVIDER_SITE_OTHER): Payer: Medicare HMO | Admitting: Internal Medicine

## 2018-03-11 ENCOUNTER — Encounter: Payer: Self-pay | Admitting: *Deleted

## 2018-03-11 ENCOUNTER — Encounter (INDEPENDENT_AMBULATORY_CARE_PROVIDER_SITE_OTHER): Payer: Self-pay

## 2018-03-11 VITALS — BP 158/82 | HR 80 | Ht 67.0 in | Wt 124.4 lb

## 2018-03-11 DIAGNOSIS — K648 Other hemorrhoids: Secondary | ICD-10-CM | POA: Diagnosis not present

## 2018-03-11 NOTE — Patient Instructions (Signed)
If you are age 57 or older, your body mass index should be between 23-30. Your Body mass index is 19.48 kg/m. If this is out of the aforementioned range listed, please consider follow up with your Primary Care Provider.  If you are age 48 or younger, your body mass index should be between 19-25. Your Body mass index is 19.48 kg/m. If this is out of the aformentioned range listed, please consider follow up with your Primary Care Provider.   HEMORRHOID BANDING PROCEDURE    FOLLOW-UP CARE   1. The procedure you have had should have been relatively painless since the banding of the area involved does not have nerve endings and there is no pain sensation.  The rubber band cuts off the blood supply to the hemorrhoid and the band may fall off as soon as 48 hours after the banding (the band may occasionally be seen in the toilet bowl following a bowel movement). You may notice a temporary feeling of fullness in the rectum which should respond adequately to plain Tylenol or Motrin.  2. Following the banding, avoid strenuous exercise that evening and resume full activity the next day.  A sitz bath (soaking in a warm tub) or bidet is soothing, and can be useful for cleansing the area after bowel movements.     3. To avoid constipation, take two tablespoons of natural wheat bran, natural oat bran, flax, Benefiber or any over the counter fiber supplement and increase your water intake to 7-8 glasses daily.    4. Unless you have been prescribed anorectal medication, do not put anything inside your rectum for two weeks: No suppositories, enemas, fingers, etc.  5. Occasionally, you may have more bleeding than usual after the banding procedure.  This is often from the untreated hemorrhoids rather than the treated one.  Don't be concerned if there is a tablespoon or so of blood.  If there is more blood than this, lie flat with your bottom higher than your head and apply an ice pack to the area. If the bleeding  does not stop within a half an hour or if you feel faint, call our office at [phone] or go to the emergency room.  6. Problems are not common; however, if there is a substantial amount of bleeding, severe pain, chills, fever or difficulty passing urine (very rare) or other problems, you should call us at [phone] or report to the nearest emergency room.  7. Do not stay seated continuously for more than 2-3 hours for a day or two after the procedure.  Tighten your buttock muscles 10-15 times every two hours and take 10-15 deep breaths every 1-2 hours.  Do not spend more than a few minutes on the toilet if you cannot empty your bowel; instead re-visit the toilet at a later time.    Thank you for choosing [facility], Dr. Hilarie Fredrickson

## 2018-03-12 NOTE — Progress Notes (Signed)
Vincent [name] is a 57 year old male with a past medical history of recurrent anemia and bleeding internal hemorrhoids seen yesterday by Tye Savoy, NP Set up today for hemorrhoidal banding  He has had near daily hemorrhoidal symptoms and he is having 1 bowel movement per day.  He has rectal bleeding with almost every bowel movement.  No anal pain, itching or burning.  No leakage or seepage from the rectum/anus.  He does have intermittent prolapsing tissue Adenoids were found at the time of his colonoscopy which I performed on 09/11/2015   PROCEDURE NOTE:  The patient presents with symptomatic grade 2-3 internal hemorrhoids, requesting rubber band ligation of his hemorrhoidal disease.  All risks, benefits and alternative forms of therapy were described and informed consent was obtained.   The anorectum was pre-medicated with 0.125% nitroglycerin ointment The decision was made to band the LL internal hemorrhoid, and the [place] was used to perform band ligation without complication.   Digital anorectal examination was then performed to assure proper positioning of the band, and to adjust the banded tissue as required.  The patient was discharged home without pain or other issues.  Dietary and behavioral recommendations were given and along with follow-up instructions.     The patient will return as scheduled for  follow-up and possible additional banding as required. No complications were encountered and the patient tolerated the procedure well.

## 2018-04-20 ENCOUNTER — Encounter: Payer: Self-pay | Admitting: Internal Medicine

## 2018-04-20 ENCOUNTER — Ambulatory Visit (INDEPENDENT_AMBULATORY_CARE_PROVIDER_SITE_OTHER): Payer: Medicare HMO | Admitting: Internal Medicine

## 2018-04-20 VITALS — BP 136/72 | HR 78 | Ht 67.0 in | Wt 132.1 lb

## 2018-04-20 DIAGNOSIS — K648 Other hemorrhoids: Secondary | ICD-10-CM | POA: Diagnosis not present

## 2018-04-20 NOTE — Patient Instructions (Signed)
If you are age 57 or older, your body mass index should be between 23-30. Your Body mass index is 20.69 kg/m. If this is out of the aforementioned range listed, please consider follow up with your Primary Care Provider.  If you are age 40 or younger, your body mass index should be between 19-25. Your Body mass index is 20.69 kg/m. If this is out of the aformentioned range listed, please consider follow up with your Primary Care Provider.   HEMORRHOID BANDING PROCEDURE    FOLLOW-UP CARE   1. The procedure you have had should have been relatively painless since the banding of the area involved does not have nerve endings and there is no pain sensation.  The rubber band cuts off the blood supply to the hemorrhoid and the band may fall off as soon as 48 hours after the banding (the band may occasionally be seen in the toilet bowl following a bowel movement). You may notice a temporary feeling of fullness in the rectum which should respond adequately to plain Tylenol or Motrin.  2. Following the banding, avoid strenuous exercise that evening and resume full activity the next day.  A sitz bath (soaking in a warm tub) or bidet is soothing, and can be useful for cleansing the area after bowel movements.     3. To avoid constipation, take two tablespoons of natural wheat bran, natural oat bran, flax, Benefiber or any over the counter fiber supplement and increase your water intake to 7-8 glasses daily.    4. Unless you have been prescribed anorectal medication, do not put anything inside your rectum for two weeks: No suppositories, enemas, fingers, etc.  5. Occasionally, you may have more bleeding than usual after the banding procedure.  This is often from the untreated hemorrhoids rather than the treated one.  Don't be concerned if there is a tablespoon or so of blood.  If there is more blood than this, lie flat with your bottom higher than your head and apply an ice pack to the area. If the bleeding  does not stop within a half an hour or if you feel faint, call our office at [phone] or go to the emergency room.  6. Problems are not common; however, if there is a substantial amount of bleeding, severe pain, chills, fever or difficulty passing urine (very rare) or other problems, you should call us at [phone] or report to the nearest emergency room.  7. Do not stay seated continuously for more than 2-3 hours for a day or two after the procedure.  Tighten your buttock muscles 10-15 times every two hours and take 10-15 deep breaths every 1-2 hours.  Do not spend more than a few minutes on the toilet if you cannot empty your bowel; instead re-visit the toilet at a later time.

## 2018-04-20 NOTE — Progress Notes (Signed)
Vincent [name] returns for consideration of repeat hemorrhoidal banding.  He underwent hemorrhoidal banding to the left lateral internal hemorrhoid on 03/11/2018 for symptomatic bleeding hemorrhoids.  He was also dealing with prolapse.  He had significant improvement since initial banding is had no further bleeding.  No peri-anal or rectal pain.  Happy with treatment response to this point   PROCEDURE NOTE:  The patient presents with symptomatic grade 2 to 3 internal hemorrhoids, requesting rubber band ligation of his hemorrhoidal disease.  All risks, benefits and alternative forms of therapy were described and informed consent was obtained.   The anorectum was pre-medicated with 0.125% nitroglycerin ointment The decision was made to band the RA internal hemorrhoid, and the [place] was used to perform band ligation without complication.   Digital anorectal examination was then performed to assure proper positioning of the band, and to adjust the banded tissue as required.  The patient was discharged home without pain or other issues.  Dietary and behavioral recommendations were given and along with follow-up instructions.    The patient will return as scheduled for  follow-up and possible additional banding as required. No complications were encountered and the patient tolerated the procedure well.

## 2018-05-05 ENCOUNTER — Ambulatory Visit (INDEPENDENT_AMBULATORY_CARE_PROVIDER_SITE_OTHER): Payer: Medicare HMO | Admitting: Internal Medicine

## 2018-05-05 ENCOUNTER — Encounter: Payer: Self-pay | Admitting: Internal Medicine

## 2018-05-05 VITALS — BP 134/70 | HR 72 | Ht 67.0 in | Wt 129.4 lb

## 2018-05-05 DIAGNOSIS — K648 Other hemorrhoids: Secondary | ICD-10-CM | POA: Diagnosis not present

## 2018-05-05 NOTE — Patient Instructions (Signed)

## 2018-05-05 NOTE — Progress Notes (Signed)
Mr. Rayl returns today for consideration of repeat hemorrhoidal banding.  He underwent hemorrhoidal banding on 03/11/2018 and 04/20/2018 for symptomatic bleeding internal hemorrhoids.  He was also having hemorrhoidal prolapse symptoms  Significant improvement with banding thus far.  No further bleeding or prolapse symptoms.   PROCEDURE NOTE:  The patient presents with symptomatic grade 2-3 internal hemorrhoids, requesting rubber band ligation of his hemorrhoidal disease.  All risks, benefits and alternative forms of therapy were described and informed consent was obtained.   The anorectum was pre-medicated with 0.125% nitroglycerin ointment The decision was made to band the RP internal hemorrhoid, and the [place] was used to perform band ligation without complication.   Digital anorectal examination was then performed to assure proper positioning of the band, and to adjust the banded tissue as required.  The patient was discharged home without pain or other issues.  Dietary and behavioral recommendations were given and along with follow-up instructions.    The patient will return as needed for follow-up and possible additional banding as required. No complications were encountered and the patient tolerated the procedure well.

## 2018-05-17 ENCOUNTER — Other Ambulatory Visit: Payer: Self-pay | Admitting: Internal Medicine

## 2018-05-25 ENCOUNTER — Encounter: Payer: Self-pay | Admitting: Internal Medicine

## 2018-05-25 ENCOUNTER — Inpatient Hospital Stay (HOSPITAL_BASED_OUTPATIENT_CLINIC_OR_DEPARTMENT_OTHER): Payer: Medicare HMO | Admitting: Internal Medicine

## 2018-05-25 ENCOUNTER — Inpatient Hospital Stay: Payer: Medicare HMO | Attending: Internal Medicine

## 2018-05-25 ENCOUNTER — Telehealth: Payer: Self-pay | Admitting: Internal Medicine

## 2018-05-25 VITALS — BP 163/85 | HR 84 | Temp 98.5°F | Resp 18 | Ht 67.0 in | Wt 123.1 lb

## 2018-05-25 DIAGNOSIS — K648 Other hemorrhoids: Secondary | ICD-10-CM | POA: Diagnosis not present

## 2018-05-25 DIAGNOSIS — D500 Iron deficiency anemia secondary to blood loss (chronic): Secondary | ICD-10-CM | POA: Diagnosis not present

## 2018-05-25 DIAGNOSIS — J449 Chronic obstructive pulmonary disease, unspecified: Secondary | ICD-10-CM | POA: Insufficient documentation

## 2018-05-25 DIAGNOSIS — Z79899 Other long term (current) drug therapy: Secondary | ICD-10-CM | POA: Diagnosis not present

## 2018-05-25 DIAGNOSIS — I10 Essential (primary) hypertension: Secondary | ICD-10-CM | POA: Diagnosis not present

## 2018-05-25 DIAGNOSIS — H409 Unspecified glaucoma: Secondary | ICD-10-CM | POA: Diagnosis not present

## 2018-05-25 DIAGNOSIS — K922 Gastrointestinal hemorrhage, unspecified: Secondary | ICD-10-CM | POA: Diagnosis not present

## 2018-05-25 DIAGNOSIS — D563 Thalassemia minor: Secondary | ICD-10-CM | POA: Diagnosis not present

## 2018-05-25 DIAGNOSIS — D62 Acute posthemorrhagic anemia: Secondary | ICD-10-CM

## 2018-05-25 LAB — CBC WITH DIFFERENTIAL (CANCER CENTER ONLY)
Basophils Absolute: 0.1 10*3/uL (ref 0.0–0.1)
Basophils Relative: 3 %
Eosinophils Absolute: 0 10*3/uL (ref 0.0–0.5)
Eosinophils Relative: 1 %
HEMATOCRIT: 38.3 % — AB (ref 38.4–49.9)
HEMOGLOBIN: 12.5 g/dL — AB (ref 13.0–17.1)
LYMPHS PCT: 24 %
Lymphs Abs: 0.9 10*3/uL (ref 0.9–3.3)
MCH: 24.7 pg — ABNORMAL LOW (ref 27.2–33.4)
MCHC: 32.6 g/dL (ref 32.0–36.0)
MCV: 75.7 fL — AB (ref 79.3–98.0)
MONOS PCT: 18 %
Monocytes Absolute: 0.7 10*3/uL (ref 0.1–0.9)
NEUTROS ABS: 2 10*3/uL (ref 1.5–6.5)
Neutrophils Relative %: 54 %
Platelet Count: 96 10*3/uL — ABNORMAL LOW (ref 140–400)
RBC: 5.06 MIL/uL (ref 4.20–5.82)
RDW: 16.1 % — ABNORMAL HIGH (ref 11.0–14.6)
WBC Count: 3.8 10*3/uL — ABNORMAL LOW (ref 4.0–10.3)

## 2018-05-25 LAB — FERRITIN: FERRITIN: 135 ng/mL (ref 22–316)

## 2018-05-25 LAB — IRON AND TIBC
Iron: 183 ug/dL — ABNORMAL HIGH (ref 42–163)
Saturation Ratios: 70 % (ref 42–163)
TIBC: 261 ug/dL (ref 202–409)
UIBC: 78 ug/dL

## 2018-05-25 NOTE — Telephone Encounter (Signed)
Gave pt avs and calendar with appts per 6/17 los.  °

## 2018-05-25 NOTE — Progress Notes (Signed)
[facility] [phone]   [phone]  OFFICE PROGRESS NOTE  [name], MD [place] [address]  DIAGNOSIS: Iron deficiency anemia secondary to gastrointestinal blood loss in addition to thalassemia minor.  PRIOR THERAPY: Feraheme 510 mg IV 2 doses last dose was given on 08/16/2015.  CURRENT THERAPY: Oral iron tablets over-the-counter.  INTERVAL HISTORY: Vincent [name] 57 y.o. male returns to the clinic today for follow-up visit accompanied by his wife.  The patient is feeling fine today with no specific complaints.  He underwent repeat hemorrhoidal banding under the care of Dr. Hilarie Fredrickson.  He is feeling much better.  He continues on the oral iron tablets.  He denied having any chest pain, shortness breath, cough or hemoptysis.  He denied having any fever or chills.  The patient has no nausea, vomiting, diarrhea or constipation.  He is here today for evaluation and repeat blood work.   MEDICAL HISTORY: Past Medical History:  Diagnosis Date  . Anemia   . Blood transfusion without reported diagnosis   . CAP (community acquired pneumonia)   . COPD (chronic obstructive pulmonary disease) ([place])   . Essential hypertension 07/05/2013   Chronic    . Glaucoma   . H. pylori infection   . Hyperplastic colon polyp   . Hypertension   . Internal hemorrhoids   . Iron deficiency anemia due to chronic blood loss 08/02/2015    9/16 The patient had an iron infusion (Ferumoxytol) this morning and left about a half hour later and was fine. Now he is home and he is breaking out in sweat, nauseated and dizzy when standing. His BP is 160/86 and HR is between 90-96". It was his second and last iron infusion.  Likely side effects w/Ferumoxytol IV.    Marland Kitchen Severe protein-calorie malnutrition ([place])   . Thalassemia minor 08/02/2015  . Tobacco use disorder 04/10/2015    ALLERGIES:  has No Known Allergies.  MEDICATIONS:  Current Outpatient  Medications  Medication Sig Dispense Refill  . albuterol (PROVENTIL HFA;VENTOLIN HFA) 108 (90 Base) MCG/ACT inhaler Inhale 2 puffs every 6 (six) hours as needed into the lungs for wheezing or shortness of breath. Needs annual visit for further refills 1 Inhaler 0  . atenolol (TENORMIN) 25 MG tablet TAKE 1 TABLET BY MOUTH EVERY DAY 90 tablet 1  . ferrous sulfate 325 (65 FE) MG tablet Take 325 mg by mouth daily with breakfast.    . latanoprost (XALATAN) 0.005 % ophthalmic solution INSTILL 1 DROP IN RIGHT EYE NIGHTLY  12  . SUMAtriptan (IMITREX) 100 MG tablet TAKE 1 TABLET BY MOUTH EVERY 2 HOURS AS NEEDED FOR MIGRAINE OR HEADACHE. MAY REPEAT IN 2 HOURS  11  . tiotropium (SPIRIVA HANDIHALER) 18 MCG inhalation capsule Place 1 capsule (18 mcg total) into inhaler and inhale daily. 30 capsule 12  . topiramate (TOPAMAX) 50 MG tablet TAKE 1 TABLET BY MOUTH TWICE A DAY 60 tablet 2   No current facility-administered medications for this visit.     SURGICAL HISTORY:  Past Surgical History:  Procedure Laterality Date  . plate in neck    . right shoulder arthroscopy      REVIEW OF SYSTEMS:  A comprehensive review of systems was negative.   PHYSICAL EXAMINATION: General appearance: alert, cooperative and no distress Head: Normocephalic, without obvious abnormality, atraumatic Neck: no adenopathy, no JVD, supple, symmetrical, trachea midline and thyroid not enlarged, symmetric, no tenderness/mass/nodules Lymph nodes: Cervical, supraclavicular,  and axillary nodes normal. Resp: clear to auscultation bilaterally Back: symmetric, no curvature. ROM normal. No CVA tenderness. Cardio: regular rate and rhythm, S1, S2 normal, no murmur, click, rub or gallop GI: soft, non-tender; bowel sounds normal; no masses,  no organomegaly Extremities: extremities normal, atraumatic, no cyanosis or edema  ECOG PERFORMANCE STATUS: 1 - Symptomatic but completely ambulatory  Blood pressure (!) 163/85, pulse 84, temperature  98.5 F (36.9 C), temperature source Oral, resp. rate 18, height 5\' 7"  (1.702 m), weight 123 lb 1.6 oz (55.8 kg), SpO2 100 %.  LABORATORY DATA: Lab Results  Component Value Date   WBC 14.5 (H) 02/23/2018   HGB 9.7 (L) 02/23/2018   HCT 32.5 (L) 02/23/2018   MCV 69.1 (L) 02/23/2018   PLT 168 02/23/2018      Chemistry      Component Value Date/Time   NA 138 07/02/2016 0809   K 3.6 07/02/2016 0809   CL 104 06/18/2016 0840   CO2 20 (L) 07/02/2016 0809   BUN 10.3 07/02/2016 0809   CREATININE 1.2 07/02/2016 0809      Component Value Date/Time   CALCIUM 9.1 07/02/2016 0809   ALKPHOS 67 07/02/2016 0809   AST 42 (H) 07/02/2016 0809   ALT 117 (H) 07/02/2016 0809   BILITOT <0.30 07/02/2016 0809     Iron study and ferritin are still pending.  RADIOGRAPHIC STUDIES: No results found.  ASSESSMENT AND PLAN:  This is a very pleasant 57 years old African-American male with iron deficiency anemia secondary to chronic GI blood loss in addition to thalassemia minor.  The patient also underwent banding for internal hemorrhoids. CBC today showed improvement of his hemoglobin and hematocrit. I recommended for the patient to continue on the oral iron tablets for now. I will see him back for follow-up visit in 3 months for evaluation with repeat CBC, iron study and ferritin. He was advised to call immediately if he has any concerning symptoms in the interval. The patient voices understanding of current disease status and treatment options and is in agreement with the current care plan. All questions were answered. The patient knows to call the clinic with any problems, questions or concerns. We can certainly see the patient much sooner if necessary. I spent 10 minutes counseling the patient face to face. The total time spent in the appointment was 15 minutes.  Disclaimer: This note was dictated with voice recognition software. Similar sounding words can inadvertently be transcribed and may not be  corrected upon review.

## 2018-07-01 NOTE — Progress Notes (Deleted)
Subjective:   Vincent [name] is a 57 y.o. male who presents for Medicare Annual/Subsequent preventive examination.  Review of Systems:  No ROS.  Medicare Wellness Visit. Additional risk factors are reflected in the social history.    Sleep patterns: {SX; SLEEP PATTERNS:18802::"feels rested on waking","does not get up to void","gets up *** times nightly to void","sleeps *** hours nightly"}.    Home Safety/Smoke Alarms: Feels safe in home. Smoke alarms in place.  Living environment; residence and Firearm Safety: {Rehab home environment / accessibility:30080::"no firearms","firearms stored safely"}. Seat Belt Safety/Bike Helmet: Wears seat belt.      Objective:    Vitals: There were no vitals taken for this visit.  There is no height or weight on file to calculate BMI.  Advanced Directives 02/23/2018 07/02/2017 01/06/2017 12/29/2015 09/27/2015 09/11/2015 04/07/2015  Does Patient Have a Medical Advance Directive? No No No No No No No  Would patient like information on creating a medical advance directive? - - - - No - patient declined information - No - patient declined information    Tobacco Social History   Tobacco Use  Smoking Status Former Smoker  . Packs/day: 0.50  . Years: 40.00  . Pack years: 20.00  . Types: Cigarettes  . Last attempt to quit: 03/10/2015  . Years since quitting: 3.3  Smokeless Tobacco Never Used     Counseling given: Not Answered  Past Medical History:  Diagnosis Date  . Anemia   . Blood transfusion without reported diagnosis   . CAP (community acquired pneumonia)   . COPD (chronic obstructive pulmonary disease) ([place])   . Essential hypertension 07/05/2013   Chronic    . Glaucoma   . H. pylori infection   . Hyperplastic colon polyp   . Hypertension   . Internal hemorrhoids   . Iron deficiency anemia due to chronic blood loss 08/02/2015    9/16 The patient had an iron infusion (Ferumoxytol) this morning and left about a half hour later and was fine. Now  he is home and he is breaking out in sweat, nauseated and dizzy when standing. His BP is 160/86 and HR is between 90-96". It was his second and last iron infusion.  Likely side effects w/Ferumoxytol IV.    Marland Kitchen Severe protein-calorie malnutrition ([place])   . Thalassemia minor 08/02/2015  . Tobacco use disorder 04/10/2015   Past Surgical History:  Procedure Laterality Date  . plate in neck    . right shoulder arthroscopy     Family History  Problem Relation Age of Onset  . Heart disease Father   . Sickle cell anemia Mother        deceased  . Diabetes Sister   . Stomach cancer Neg Hx   . Esophageal cancer Neg Hx   . Pancreatic cancer Neg Hx   . Colon cancer Neg Hx   . Liver disease Neg Hx    Social History   Socioeconomic History  . Marital status: Married    Spouse name: Not on file  . Number of children: 2  . Years of education: Not on file  . Highest education level: Not on file  Occupational History  . Occupation: disabled  Social Needs  . Financial resource strain: Not on file  . Food insecurity:    Worry: Not on file    Inability: Not on file  . Transportation needs:    Medical: Not on file    Non-medical: Not on file  Tobacco Use  . Smoking status: Former  Smoker    Packs/day: 0.50    Years: 40.00    Pack years: 20.00    Types: Cigarettes    Last attempt to quit: 03/10/2015    Years since quitting: 3.3  . Smokeless tobacco: Never Used  Substance and Sexual Activity  . Alcohol use: Yes    Alcohol/week: 0.0 oz    Comment: occasionally   . Drug use: No  . Sexual activity: Not Currently  Lifestyle  . Physical activity:    Days per week: Not on file    Minutes per session: Not on file  . Stress: Not on file  Relationships  . Social connections:    Talks on phone: Not on file    Gets together: Not on file    Attends religious service: Not on file    Active member of club or organization: Not on file    Attends meetings of clubs or organizations: Not on file     Relationship status: Not on file  Other Topics Concern  . Not on file  Social History Narrative  . Not on file    Outpatient Encounter Medications as of 07/03/2018  Medication Sig  . albuterol (PROVENTIL HFA;VENTOLIN HFA) 108 (90 Base) MCG/ACT inhaler Inhale 2 puffs every 6 (six) hours as needed into the lungs for wheezing or shortness of breath. Needs annual visit for further refills  . atenolol (TENORMIN) 25 MG tablet TAKE 1 TABLET BY MOUTH EVERY DAY  . ferrous sulfate 325 (65 FE) MG tablet Take 325 mg by mouth daily with breakfast.  . latanoprost (XALATAN) 0.005 % ophthalmic solution INSTILL 1 DROP IN RIGHT EYE NIGHTLY  . SUMAtriptan (IMITREX) 100 MG tablet TAKE 1 TABLET BY MOUTH EVERY 2 HOURS AS NEEDED FOR MIGRAINE OR HEADACHE. MAY REPEAT IN 2 HOURS  . tiotropium (SPIRIVA HANDIHALER) 18 MCG inhalation capsule Place 1 capsule (18 mcg total) into inhaler and inhale daily.  Marland Kitchen topiramate (TOPAMAX) 50 MG tablet TAKE 1 TABLET BY MOUTH TWICE A DAY   No facility-administered encounter medications on file as of 07/03/2018.     Activities of Daily Living In your present state of health, do you have any difficulty performing the following activities: 07/02/2017  Hearing? N  Vision? Y  Walking or climbing stairs? N  Dressing or bathing? N  Doing errands, shopping? N  Preparing Food and eating ? N  Using the Toilet? N  In the past six months, have you accidently leaked urine? N  Do you have problems with loss of bowel control? N  Managing your Medications? N  Managing your Finances? N  Housekeeping or managing your Housekeeping? N  Some recent data might be hidden    Patient Care Team: [name], MD as PCP - General (Internal Medicine)   Assessment:   This is a routine wellness examination for Doug. Physical assessment deferred to PCP.   Exercise Activities and Dietary recommendations   Diet (meal preparation, eat out, water intake, caffeinated beverages, dairy products,  fruits and vegetables): {Desc; diets:16563}  Goals    None      Fall Risk Fall Risk  07/02/2017 07/05/2013  Falls in the past year? Yes No  Number falls in past yr: 2 or more -  Risk for fall due to : Impaired balance/gait;Impaired mobility;Impaired vision -   Depression Screen PHQ 2/9 Scores 07/02/2017 07/05/2013  PHQ - 2 Score 2 0  PHQ- 9 Score 6 -    Cognitive Function  Immunization History  Administered Date(s) Administered  . Influenza,inj,Quad PF,6+ Mos 08/17/2014, 10/04/2016, 10/17/2017  . Pneumococcal Polysaccharide-23 04/09/2015  . Tdap 12/10/2007   Screening Tests Health Maintenance  Topic Date Due  . Hepatitis C Screening  1961/05/16  . TETANUS/TDAP  12/09/2017  . INFLUENZA VACCINE  07/09/2018  . COLONOSCOPY  09/10/2025  . HIV Screening  Completed      Plan:     I have personally reviewed and noted the following in the patient's chart:   . Medical and social history . Use of alcohol, tobacco or illicit drugs  . Current medications and supplements . Functional ability and status . Nutritional status . Physical activity . Advanced directives . List of other physicians . Vitals . Screenings to include cognitive, depression, and falls . Referrals and appointments  In addition, I have reviewed and discussed with patient certain preventive protocols, quality metrics, and best practice recommendations. A written personalized care plan for preventive services as well as general preventive health recommendations were provided to patient.     Michiel Cowboy, RN  07/01/2018

## 2018-07-03 ENCOUNTER — Ambulatory Visit: Payer: Medicare HMO

## 2018-07-06 NOTE — Progress Notes (Signed)
Subjective:   Vincent [name] is a 57 y.o. male who presents for Medicare Annual/Subsequent preventive examination. Patient states he is out of imitrex and is having frequent severe headaches Review of Systems:  No ROS.  Medicare Wellness Visit. Additional risk factors are reflected in the social history.  Cardiac Risk Factors include: advanced age (>41men, >58 women);hypertension;male gender Sleep patterns: gets up 2-3times nightly to void and sleeps 7-8 hours nightly.    Home Safety/Smoke Alarms: Feels safe in home. Smoke alarms in place.  Living environment; residence and Firearm Safety: 1-story house/ trailer, no firearms. Lives with wife, no needs for DME, good support system Seat Belt Safety/Bike Helmet: Wears seat belt.     Objective:    Vitals: BP 124/72   Pulse 94   Resp 18   Ht 5\' 7"  (1.702 m)   Wt 129 lb (58.5 kg)   SpO2 98%   BMI 20.20 kg/m   Body mass index is 20.2 kg/m.  Advanced Directives 07/07/2018 02/23/2018 07/02/2017 01/06/2017 12/29/2015 09/27/2015 09/11/2015  Does Patient Have a Medical Advance Directive? No No No No No No No  Would patient like information on creating a medical advance directive? Yes (ED - Information included in AVS) - - - - No - patient declined information -    Tobacco Social History   Tobacco Use  Smoking Status Former Smoker  . Packs/day: 0.50  . Years: 40.00  . Pack years: 20.00  . Types: Cigarettes  . Last attempt to quit: 03/10/2015  . Years since quitting: 3.3  Smokeless Tobacco Never Used     Counseling given: Not Answered  Past Medical History:  Diagnosis Date  . Anemia   . Blood transfusion without reported diagnosis   . CAP (community acquired pneumonia)   . COPD (chronic obstructive pulmonary disease) ([place])   . Essential hypertension 07/05/2013   Chronic    . Glaucoma   . H. pylori infection   . Hyperplastic colon polyp   . Hypertension   . Internal hemorrhoids   . Iron deficiency anemia due to chronic blood loss  08/02/2015    9/16 The patient had an iron infusion (Ferumoxytol) this morning and left about a half hour later and was fine. Now he is home and he is breaking out in sweat, nauseated and dizzy when standing. His BP is 160/86 and HR is between 90-96". It was his second and last iron infusion.  Likely side effects w/Ferumoxytol IV.    Marland Kitchen Severe protein-calorie malnutrition ([place])   . Thalassemia minor 08/02/2015  . Tobacco use disorder 04/10/2015   Past Surgical History:  Procedure Laterality Date  . plate in neck    . right shoulder arthroscopy     Family History  Problem Relation Age of Onset  . Heart disease Father   . Sickle cell anemia Mother        deceased  . Diabetes Sister   . Stomach cancer Neg Hx   . Esophageal cancer Neg Hx   . Pancreatic cancer Neg Hx   . Colon cancer Neg Hx   . Liver disease Neg Hx    Social History   Socioeconomic History  . Marital status: Married    Spouse name: Not on file  . Number of children: 2  . Years of education: Not on file  . Highest education level: Not on file  Occupational History  . Occupation: disabled  Social Needs  . Financial resource strain: Not very hard  . Food insecurity:  Worry: Never true    Inability: Never true  . Transportation needs:    Medical: No    Non-medical: No  Tobacco Use  . Smoking status: Former Smoker    Packs/day: 0.50    Years: 40.00    Pack years: 20.00    Types: Cigarettes    Last attempt to quit: 03/10/2015    Years since quitting: 3.3  . Smokeless tobacco: Never Used  Substance and Sexual Activity  . Alcohol use: Yes    Alcohol/week: 0.0 oz    Comment: occasionally   . Drug use: No  . Sexual activity: Yes  Lifestyle  . Physical activity:    Days per week: 4 days    Minutes per session: 40 min  . Stress: Not at all  Relationships  . Social connections:    Talks on phone: More than three times a week    Gets together: More than three times a week    Attends religious service: More  than 4 times per year    Active member of club or organization: Yes    Attends meetings of clubs or organizations: More than 4 times per year    Relationship status: Married  Other Topics Concern  . Not on file  Social History Narrative  . Not on file    Outpatient Encounter Medications as of 07/07/2018  Medication Sig  . albuterol (PROVENTIL HFA;VENTOLIN HFA) 108 (90 Base) MCG/ACT inhaler Inhale 2 puffs every 6 (six) hours as needed into the lungs for wheezing or shortness of breath. Needs annual visit for further refills  . atenolol (TENORMIN) 25 MG tablet TAKE 1 TABLET BY MOUTH EVERY DAY  . ferrous sulfate 325 (65 FE) MG tablet Take 325 mg by mouth daily with breakfast.  . latanoprost (XALATAN) 0.005 % ophthalmic solution INSTILL 1 DROP IN RIGHT EYE NIGHTLY  . SUMAtriptan (IMITREX) 100 MG tablet TAKE 1 TABLET BY MOUTH EVERY 2 HOURS AS NEEDED FOR MIGRAINE OR HEADACHE. MAY REPEAT IN 2 HOURS  . topiramate (TOPAMAX) 50 MG tablet TAKE 1 TABLET BY MOUTH TWICE A DAY  . [DISCONTINUED] SUMAtriptan (IMITREX) 100 MG tablet TAKE 1 TABLET BY MOUTH EVERY 2 HOURS AS NEEDED FOR MIGRAINE OR HEADACHE. MAY REPEAT IN 2 HOURS  . tiotropium (SPIRIVA HANDIHALER) 18 MCG inhalation capsule Place 1 capsule (18 mcg total) into inhaler and inhale daily.   No facility-administered encounter medications on file as of 07/07/2018.     Activities of Daily Living In your present state of health, do you have any difficulty performing the following activities: 07/07/2018  Hearing? N  Vision? N  Difficulty concentrating or making decisions? N  Walking or climbing stairs? N  Dressing or bathing? N  Doing errands, shopping? N  Preparing Food and eating ? N  Using the Toilet? N  In the past six months, have you accidently leaked urine? N  Do you have problems with loss of bowel control? N  Managing your Medications? N  Managing your Finances? N  Housekeeping or managing your Housekeeping? N  Some recent data might be  hidden    Patient Care Team: Vincent Koch, MD as PCP - General (Internal Medicine)   Assessment:   This is a routine wellness examination for Vincent [name]. Physical assessment deferred to PCP.   Exercise Activities and Dietary recommendations Current Exercise Habits: Home exercise routine, Type of exercise: walking, Time (Minutes): 40, Frequency (Times/Week): 4, Weekly Exercise (Minutes/Week): 160, Intensity: Mild, Exercise limited by: None identified  Diet (  meal preparation, eat out, water intake, caffeinated beverages, dairy products, fruits and vegetables): in general, a "healthy" diet  , well balanced   Discussed supplementing with Ensure to maintain and gain weight (samples and coupons provided). Reviewed heart healthy diet, Encouraged patient to increase daily water and healthy fluid intake. Diet education was attached to patient's AVS.  Goals    . 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       Fall Risk Fall Risk  07/07/2018 07/02/2017 07/05/2013  Falls in the past year? No Yes No  Number falls in past yr: - 2 or more -  Risk for fall due to : - Impaired balance/gait;Impaired mobility;Impaired vision -    Depression Screen PHQ 2/9 Scores 07/07/2018 07/02/2017 07/05/2013  PHQ - 2 Score 0 2 0  PHQ- 9 Score 2 6 -    Cognitive Function       Ad8 score reviewed for issues:  Issues making decisions: no  Less interest in hobbies / activities: no  Repeats questions, stories (family complaining): no  Trouble using ordinary gadgets (microwave, computer, phone):no  Forgets the month or year: no  Mismanaging finances: no  Remembering appts: no  Daily problems with thinking and/or memory: no Ad8 score is= 0  Immunization History  Administered Date(s) Administered  . Influenza,inj,Quad PF,6+ Mos 08/17/2014, 10/04/2016, 10/17/2017  . Pneumococcal Polysaccharide-23 04/09/2015  . Tdap 12/10/2007    Screening Tests Health Maintenance  Topic Date Due  . Hepatitis C Screening  1961/07/30  . TETANUS/TDAP  12/09/2017  . INFLUENZA VACCINE  07/09/2018  . COLONOSCOPY  09/10/2025  . HIV Screening  Completed      Plan:    Imitrex was refilled and yearly appointment was scheduled with PCP for 07/15/18  Continue doing brain stimulating activities (puzzles, reading, adult coloring books, staying active) to keep memory sharp.   Continue to eat heart healthy diet (full of fruits, vegetables, whole grains, lean protein, water--limit salt, fat, and sugar intake) and increase physical activity as tolerated.   I have personally reviewed and noted the following in the patient's chart:   . Medical and social history . Use of alcohol, tobacco or illicit drugs  . Current medications and supplements . Functional ability and status . Nutritional status . Physical activity . Advanced directives . List of other physicians . Vitals . Screenings to include cognitive, depression, and falls . Referrals and appointments  In addition, I have reviewed and discussed with patient certain preventive protocols, quality metrics, and best practice recommendations. A written personalized care plan for preventive services as well as general preventive health recommendations were provided to patient.     Michiel Cowboy, RN  07/07/2018

## 2018-07-07 ENCOUNTER — Ambulatory Visit (INDEPENDENT_AMBULATORY_CARE_PROVIDER_SITE_OTHER): Payer: Medicare HMO | Admitting: *Deleted

## 2018-07-07 VITALS — BP 124/72 | HR 94 | Resp 18 | Ht 67.0 in | Wt 129.0 lb

## 2018-07-07 DIAGNOSIS — D563 Thalassemia minor: Secondary | ICD-10-CM

## 2018-07-07 DIAGNOSIS — Z0000 Encounter for general adult medical examination without abnormal findings: Secondary | ICD-10-CM | POA: Diagnosis not present

## 2018-07-07 DIAGNOSIS — D500 Iron deficiency anemia secondary to blood loss (chronic): Secondary | ICD-10-CM

## 2018-07-07 MED ORDER — SUMATRIPTAN SUCCINATE 100 MG PO TABS: ORAL_TABLET | ORAL | 11 refills | Status: DC

## 2018-07-07 NOTE — Progress Notes (Signed)
Medical screening examination/treatment/procedure(s) were performed by non-physician practitioner and as supervising physician I was immediately available for consultation/collaboration. I agree with above. Raidyn Breiner A Davis Vannatter, MD 

## 2018-07-07 NOTE — Patient Instructions (Addendum)
Continue doing brain stimulating activities (puzzles, reading, adult coloring books, staying active) to keep memory sharp.   Continue to eat heart healthy diet (full of fruits, vegetables, whole grains, lean protein, water--limit salt, fat, and sugar intake) and increase physical activity as tolerated.   Vincent [name] , Thank you for taking time to come for your Medicare Wellness Visit. I appreciate your ongoing commitment to your health goals. Please review the following plan we discussed and let me know if I can assist you in the future.   These are the goals we discussed: Goals    None      This is a list of the screening recommended for you and due dates:  Health Maintenance  Topic Date Due  .  Hepatitis C: One time screening is recommended by Center for Disease Control  (CDC) for  adults born from [birth year] through 1965.   01/05/1961  . Tetanus Vaccine  12/09/2017  . Flu Shot  07/09/2018  . Colon Cancer Screening  09/10/2025  . HIV Screening  Completed     [facility] in [place], [place] in: [place] Address: [address] [place],  [address] Phone: [phone]   Migraine Headache A migraine headache is a very strong throbbing pain on one side or both sides of your head. Migraines can also cause other symptoms. Talk with your doctor about what things may bring on (trigger) your migraine headaches. Follow these instructions at home: Medicines  Take over-the-counter and prescription medicines only as told by your doctor.  Do not drive or use heavy machinery while taking prescription pain medicine.  To prevent or treat constipation while you are taking prescription pain medicine, your doctor may recommend that you: ? Drink enough fluid to keep your pee (urine) clear or pale yellow. ? Take over-the-counter or prescription medicines. ? Eat foods that are high in fiber. These include fresh  fruits and vegetables, whole grains, and beans. ? Limit foods that are high in fat and processed sugars. These include fried and sweet foods. Lifestyle  Avoid alcohol.  Do not use any products that contain nicotine or tobacco, such as cigarettes and e-cigarettes. If you need help quitting, ask your doctor.  Get at least 8 hours of sleep every night.  Limit your stress. General instructions   Keep a journal to find out what may bring on your migraines. For example, write down: ? What you eat and drink. ? How much sleep you get. ? Any change in what you eat or drink. ? Any change in your medicines.  If you have a migraine: ? Avoid things that make your symptoms worse, such as bright lights. ? It may help to lie down in a dark, quiet room. ? Do not drive or use heavy machinery. ? Ask your doctor what activities are safe for you.  Keep all follow-up visits as told by your doctor. This is important. Contact a doctor if:  You get a migraine that is different or worse than your usual migraines. Get help right away if:  Your migraine gets very bad.  You have a fever.  You have a stiff neck.  You have trouble seeing.  Your muscles feel weak or like you cannot control them.  You start to lose your balance a lot.  You start to have trouble walking.  You pass out (faint). This information is not intended to replace advice given to you by  your health care provider. Make sure you discuss any questions you have with your health care provider. Document Released: 09/03/2008 Document Revised: 06/14/2016 Document Reviewed: 05/13/2016 Elsevier Interactive Patient Education  2018 [place] Maintenance, Male A healthy lifestyle and preventive care is important for your health and wellness. Ask your health care provider about what schedule of regular examinations is right for you. What should I know about weight and diet? Eat a Healthy Diet  Eat plenty of vegetables,  fruits, whole grains, low-fat dairy products, and lean protein.  Do not eat a lot of foods high in solid fats, added sugars, or salt.  Maintain a Healthy Weight Regular exercise can help you achieve or maintain a healthy weight. You should:  Do at least 150 minutes of exercise each week. The exercise should increase your heart rate and make you sweat (moderate-intensity exercise).  Do strength-training exercises at least twice a week.  Watch Your Levels of Cholesterol and Blood Lipids  Have your blood tested for lipids and cholesterol every 5 years starting at 57 years of age. If you are at high risk for heart disease, you should start having your blood tested when you are 57 years old. You may need to have your cholesterol levels checked more often if: ? Your lipid or cholesterol levels are high. ? You are older than 57 years of age. ? You are at high risk for heart disease.  What should I know about cancer screening? Many types of cancers can be detected early and may often be prevented. Lung Cancer  You should be screened every year for lung cancer if: ? You are a current smoker who has smoked for at least 30 years. ? You are a former smoker who has quit within the past 15 years.  Talk to your health care provider about your screening options, when you should start screening, and how often you should be screened.  Colorectal Cancer  Routine colorectal cancer screening usually begins at 57 years of age and should be repeated every 5-10 years until you are 57 years old. You may need to be screened more often if early forms of precancerous polyps or small growths are found. Your health care provider may recommend screening at an earlier age if you have risk factors for colon cancer.  Your health care provider may recommend using home test kits to check for hidden blood in the stool.  A small camera at the end of a tube can be used to examine your colon (sigmoidoscopy or  colonoscopy). This checks for the earliest forms of colorectal cancer.  Prostate and Testicular Cancer  Depending on your age and overall health, your health care provider may do certain tests to screen for prostate and testicular cancer.  Talk to your health care provider about any symptoms or concerns you have about testicular or prostate cancer.  Skin Cancer  Check your skin from head to toe regularly.  Tell your health care provider about any new moles or changes in moles, especially if: ? There is a change in a mole's size, shape, or color. ? You have a mole that is larger than a pencil eraser.  Always use sunscreen. Apply sunscreen liberally and repeat throughout the day.  Protect yourself by wearing long sleeves, pants, a wide-brimmed hat, and sunglasses when outside.  What should I know about heart disease, diabetes, and high blood pressure?  If you are 58-48 years of age, have your blood pressure checked every  3-5 years. If you are 91 years of age or older, have your blood pressure checked every year. You should have your blood pressure measured twice-once when you are at a hospital or clinic, and once when you are not at a hospital or clinic. Record the average of the two measurements. To check your blood pressure when you are not at a hospital or clinic, you can use: ? An automated blood pressure machine at a pharmacy. ? A home blood pressure monitor.  Talk to your health care provider about your target blood pressure.  If you are between 91-28 years old, ask your health care provider if you should take aspirin to prevent heart disease.  Have regular diabetes screenings by checking your fasting blood sugar level. ? If you are at a normal weight and have a low risk for diabetes, have this test once every three years after the age of 68. ? If you are overweight and have a high risk for diabetes, consider being tested at a younger age or more often.  A one-time screening for  abdominal aortic aneurysm (AAA) by ultrasound is recommended for men aged 26-75 years who are current or former smokers. What should I know about preventing infection? Hepatitis B If you have a higher risk for hepatitis B, you should be screened for this virus. Talk with your health care provider to find out if you are at risk for hepatitis B infection. Hepatitis C Blood testing is recommended for:  Everyone born from [birth year] through 1965.  Anyone with known risk factors for hepatitis C.  Sexually Transmitted Diseases (STDs)  You should be screened each year for STDs including gonorrhea and chlamydia if: ? You are sexually active and are younger than 57 years of age. ? You are older than 57 years of age and your health care provider tells you that you are at risk for this type of infection. ? Your sexual activity has changed since you were last screened and you are at an increased risk for chlamydia or gonorrhea. Ask your health care provider if you are at risk.  Talk with your health care provider about whether you are at high risk of being infected with HIV. Your health care provider may recommend a prescription medicine to help prevent HIV infection.  What else can I do?  Schedule regular health, dental, and eye exams.  Stay current with your vaccines (immunizations).  Do not use any tobacco products, such as cigarettes, chewing tobacco, and e-cigarettes. If you need help quitting, ask your health care provider.  Limit alcohol intake to no more than 2 drinks per day. One drink equals 12 ounces of beer, 5 ounces of Jaleen Finch, or 1 ounces of hard liquor.  Do not use street drugs.  Do not share needles.  Ask your health care provider for help if you need support or information about quitting drugs.  Tell your health care provider if you often feel depressed.  Tell your health care provider if you have ever been abused or do not feel safe at home. This information is not intended to  replace advice given to you by your health care provider. Make sure you discuss any questions you have with your health care provider. Document Released: 05/23/2008 Document Revised: 07/24/2016 Document Reviewed: 08/29/2015 Elsevier Interactive Patient Education  [employer].

## 2018-07-15 ENCOUNTER — Ambulatory Visit: Payer: Medicare HMO | Admitting: Internal Medicine

## 2018-07-15 DIAGNOSIS — Z0289 Encounter for other administrative examinations: Secondary | ICD-10-CM

## 2018-07-16 ENCOUNTER — Ambulatory Visit (INDEPENDENT_AMBULATORY_CARE_PROVIDER_SITE_OTHER): Payer: Medicare HMO | Admitting: Internal Medicine

## 2018-07-16 ENCOUNTER — Encounter: Payer: Self-pay | Admitting: Internal Medicine

## 2018-07-16 VITALS — BP 142/82 | HR 78 | Temp 98.5°F | Ht 67.0 in | Wt 130.0 lb

## 2018-07-16 DIAGNOSIS — R51 Headache: Secondary | ICD-10-CM | POA: Diagnosis not present

## 2018-07-16 DIAGNOSIS — J449 Chronic obstructive pulmonary disease, unspecified: Secondary | ICD-10-CM

## 2018-07-16 DIAGNOSIS — R519 Headache, unspecified: Secondary | ICD-10-CM

## 2018-07-16 DIAGNOSIS — Z0000 Encounter for general adult medical examination without abnormal findings: Secondary | ICD-10-CM

## 2018-07-16 DIAGNOSIS — N529 Male erectile dysfunction, unspecified: Secondary | ICD-10-CM | POA: Diagnosis not present

## 2018-07-16 MED ORDER — SILDENAFIL CITRATE 100 MG PO TABS: 50.0000 mg | ORAL_TABLET | Freq: Every day | ORAL | 11 refills | Status: AC | PRN

## 2018-07-16 MED ORDER — UMECLIDINIUM-VILANTEROL 62.5-25 MCG/INH IN AEPB: 1.0000 | INHALATION_SPRAY | Freq: Every day | RESPIRATORY_TRACT | 6 refills | Status: AC

## 2018-07-16 MED ORDER — TOPIRAMATE 50 MG PO TABS: 50.0000 mg | ORAL_TABLET | Freq: Every day | ORAL | 3 refills | Status: AC

## 2018-07-16 NOTE — Progress Notes (Signed)
   Subjective:    Patient ID: Vincent [name], male    DOB: [birth year], 57 y.o.   MRN: [id number]  HPI The patient is a 57 YO man coming in for several concerns including ED (having problems getting erections and keeping them, also having some urinary frequency, starting and stopping when urinating, some increase in night time urination, going on for at least 1 month) and headaches (having migraines every day or every other day for the last month, using sumatriptan for these although this does not help, taking atenolol for BP control and migraine prevention, has topamax on list but no rx since 2017 and he admits he is not taking this for some time, denies vision change or hearing change or numbness during episode, headaches are 7-10/10 pain and last for the rest of the day) and COPD (he is having more SOB, admits to taking spiriva daily although no rx since 2016, uses albuterol about 2-3 times per day, current non-smoker but significant smoking history, denies change in sputum, denies fevers or chills).   Review of Systems  Constitutional: Negative.   HENT: Negative.   Eyes: Negative.   Respiratory: Positive for shortness of breath. Negative for cough and chest tightness.   Cardiovascular: Negative for chest pain, palpitations and leg swelling.  Gastrointestinal: Negative for abdominal distention, abdominal pain, constipation, diarrhea, nausea and vomiting.  Genitourinary: Positive for frequency and urgency.       Starting and stopping while urinating, ED  Musculoskeletal: Negative.   Skin: Negative.   Neurological: Positive for headaches.  Psychiatric/Behavioral: Negative.       Objective:   Physical Exam  Constitutional: He is oriented to person, place, and time. He appears well-developed and well-nourished.  HENT:  Head: Normocephalic and atraumatic.  Eyes: EOM are normal.  Neck: Normal range of motion.  Cardiovascular: Normal rate and regular rhythm.  Pulmonary/Chest: Effort normal and  breath sounds normal. No respiratory distress. He has no wheezes. He has no rales.  Abdominal: Soft. Bowel sounds are normal. He exhibits no distension. There is no tenderness. There is no rebound.  Musculoskeletal: He exhibits no edema.  Neurological: He is alert and oriented to person, place, and time. Coordination normal.  Skin: Skin is warm and dry.  Psychiatric: He has a normal mood and affect.   Vitals:   07/16/18 0832  BP: (!) 142/82  Pulse: 78  Temp: 98.5 F (36.9 C)  TempSrc: Oral  SpO2: 99%  Weight: 130 lb (59 kg)  Height: 5\' 7"  (1.702 m)      Assessment & Plan:

## 2018-07-16 NOTE — Assessment & Plan Note (Signed)
Rx for topamax 50 mg nightly and needs visit in 1-2 months for evaluation. We talked about taking sumatriptan if needed. He cannot take daily. Is also on atenolol. If no response could consider aimovig monthly.

## 2018-07-16 NOTE — Assessment & Plan Note (Signed)
Rx for viagra. We discussed cialis daily to also help likely BPH but he was not interested at this time. Urinary symptoms are not bothersome enough at this time.

## 2018-07-16 NOTE — Patient Instructions (Addendum)
We have sent in anoro to replace the spiriva. This is an inhaler that you do everyday 1 puff a day to help open up the lungs. You can still use the albuterol inhaler if needed for breathing problems.   We have given you a prescription for viagra to use if needed for erections. For erections lasting longer than 4 hours seek care at an emergency room as these can cause damage.   We have sent in topamax which is a 1 pill a day to help you have less migraines. It is okay to use the sumatriptan if needed for migraine headaches. You should check in with [place] in about 1 month to let us know if this is working for you.   We should do a physical so if you want to schedule that you can.

## 2018-07-16 NOTE — Assessment & Plan Note (Signed)
Not controlled with spiriva although I doubt that he is taking daily. He is using albuterol 2-3 times per day which is too frequent. Rx for anoro to take daily everyday. He is a non-smoker and advised him to continue with complete and lifelong cessation of smoking.

## 2018-08-25 ENCOUNTER — Telehealth: Payer: Self-pay | Admitting: Internal Medicine

## 2018-08-25 ENCOUNTER — Inpatient Hospital Stay: Payer: Medicare HMO

## 2018-08-25 ENCOUNTER — Encounter: Payer: Self-pay | Admitting: Internal Medicine

## 2018-08-25 ENCOUNTER — Inpatient Hospital Stay: Payer: Medicare HMO | Attending: Internal Medicine | Admitting: Internal Medicine

## 2018-08-25 VITALS — BP 163/87 | HR 78 | Temp 98.4°F | Resp 18 | Ht 67.0 in | Wt 126.4 lb

## 2018-08-25 DIAGNOSIS — D696 Thrombocytopenia, unspecified: Secondary | ICD-10-CM | POA: Diagnosis not present

## 2018-08-25 DIAGNOSIS — K648 Other hemorrhoids: Secondary | ICD-10-CM | POA: Diagnosis not present

## 2018-08-25 DIAGNOSIS — D500 Iron deficiency anemia secondary to blood loss (chronic): Secondary | ICD-10-CM

## 2018-08-25 DIAGNOSIS — D72819 Decreased white blood cell count, unspecified: Secondary | ICD-10-CM | POA: Diagnosis not present

## 2018-08-25 DIAGNOSIS — D563 Thalassemia minor: Secondary | ICD-10-CM | POA: Diagnosis not present

## 2018-08-25 DIAGNOSIS — K922 Gastrointestinal hemorrhage, unspecified: Secondary | ICD-10-CM

## 2018-08-25 LAB — CBC WITH DIFFERENTIAL (CANCER CENTER ONLY)
Basophils Absolute: 0.1 10*3/uL (ref 0.0–0.1)
Basophils Relative: 2 %
Eosinophils Absolute: 0.1 10*3/uL (ref 0.0–0.5)
Eosinophils Relative: 3 %
HEMATOCRIT: 44.2 % (ref 38.4–49.9)
Hemoglobin: 14.4 g/dL (ref 13.0–17.1)
LYMPHS ABS: 1.1 10*3/uL (ref 0.9–3.3)
LYMPHS PCT: 29 %
MCH: 25.8 pg — ABNORMAL LOW (ref 27.2–33.4)
MCHC: 32.6 g/dL (ref 32.0–36.0)
MCV: 79.1 fL — AB (ref 79.3–98.0)
MONO ABS: 0.6 10*3/uL (ref 0.1–0.9)
MONOS PCT: 15 %
NEUTROS ABS: 1.9 10*3/uL (ref 1.5–6.5)
Neutrophils Relative %: 51 %
Platelet Count: 57 10*3/uL — ABNORMAL LOW (ref 140–400)
RBC: 5.59 MIL/uL (ref 4.20–5.82)
RDW: 14.7 % — AB (ref 11.0–14.6)
WBC Count: 3.8 10*3/uL — ABNORMAL LOW (ref 4.0–10.3)

## 2018-08-25 LAB — IRON AND TIBC
IRON: 187 ug/dL — AB (ref 42–163)
Saturation Ratios: 71 % (ref 42–163)
TIBC: 263 ug/dL (ref 202–409)
UIBC: 76 ug/dL

## 2018-08-25 LAB — FERRITIN: Ferritin: 85 ng/mL (ref 24–336)

## 2018-08-25 NOTE — Telephone Encounter (Signed)
Appts scheduled AVS declined/calendar printed per 9/17 los °

## 2018-08-25 NOTE — Progress Notes (Signed)
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  PRIOR THERAPY: Feraheme 510 mg IV 2 doses last dose was given on 08/16/2015.  CURRENT THERAPY: Oral iron tablets over-the-counter.  INTERVAL HISTORY: Vincent [name] 57 y.o. male returns to the clinic today for follow-up visit accompanied by his wife.  The patient is feeling fine today with no concerning complaints.  He underwent banding of the hemorrhoids.  He is feeling much better.  He denied having any bleeding issues.  He has no chest pain, shortness of breath, cough or hemoptysis.  He denied having any fever or chills.  He has no weight loss or night sweats.  The patient is here today for evaluation with repeat CBC, iron study and ferritin.   MEDICAL HISTORY: Past Medical History:  Diagnosis Date  . Anemia   . Blood transfusion without reported diagnosis   . CAP (community acquired pneumonia)   . COPD (chronic obstructive pulmonary disease) ([place])   . Essential hypertension 07/05/2013   Chronic    . Glaucoma   . H. pylori infection   . Hyperplastic colon polyp   . Hypertension   . Internal hemorrhoids   . Iron deficiency anemia due to chronic blood loss 08/02/2015    9/16 The patient had an iron infusion (Ferumoxytol) this morning and left about a half hour later and was fine. Now he is home and he is breaking out in sweat, nauseated and dizzy when standing. His BP is 160/86 and HR is between 90-96". It was his second and last iron infusion.  Likely side effects w/Ferumoxytol IV.    Marland Kitchen Severe protein-calorie malnutrition ([place])   . Thalassemia minor 08/02/2015  . Tobacco use disorder 04/10/2015    ALLERGIES:  has No Known Allergies.  MEDICATIONS:  Current Outpatient Medications  Medication Sig Dispense  Refill  . albuterol (PROVENTIL HFA;VENTOLIN HFA) 108 (90 Base) MCG/ACT inhaler Inhale 2 puffs every 6 (six) hours as needed into the lungs for wheezing or shortness of breath. Needs annual visit for further refills 1 Inhaler 0  . atenolol (TENORMIN) 25 MG tablet TAKE 1 TABLET BY MOUTH EVERY DAY 90 tablet 1  . ferrous sulfate 325 (65 FE) MG tablet Take 325 mg by mouth daily with breakfast.    . latanoprost (XALATAN) 0.005 % ophthalmic solution INSTILL 1 DROP IN RIGHT EYE NIGHTLY  12  . sildenafil (VIAGRA) 100 MG tablet Take 0.5-1 tablets (50-100 mg total) by mouth daily as needed for erectile dysfunction. 10 tablet 11  . SUMAtriptan (IMITREX) 100 MG tablet TAKE 1 TABLET BY MOUTH EVERY 2 HOURS AS NEEDED FOR MIGRAINE OR HEADACHE. MAY REPEAT IN 2 HOURS 10 tablet 11  . topiramate (TOPAMAX) 50 MG tablet Take 1 tablet (50 mg total) by mouth daily. 30 tablet 3  . umeclidinium-vilanterol (ANORO ELLIPTA) 62.5-25 MCG/INH AEPB Inhale 1 puff into the lungs daily. 30 each 6   No current facility-administered medications for this visit.     SURGICAL HISTORY:  Past Surgical History:  Procedure Laterality Date  . plate in neck    . right shoulder arthroscopy      REVIEW OF SYSTEMS:  A comprehensive review of systems was negative.   PHYSICAL EXAMINATION: General appearance: alert, cooperative and no distress Head: Normocephalic, without obvious abnormality, atraumatic Neck: no  adenopathy, no JVD, supple, symmetrical, trachea midline and thyroid not enlarged, symmetric, no tenderness/mass/nodules Lymph nodes: Cervical, supraclavicular, and axillary nodes normal. Resp: clear to auscultation bilaterally Back: symmetric, no curvature. ROM normal. No CVA tenderness. Cardio: regular rate and rhythm, S1, S2 normal, no murmur, click, rub or gallop GI: soft, non-tender; bowel sounds normal; no masses,  no organomegaly Extremities: extremities normal, atraumatic, no cyanosis or edema  ECOG PERFORMANCE STATUS: 0  - Asymptomatic  Blood pressure (!) 163/87, pulse 78, temperature 98.4 F (36.9 C), temperature source Oral, resp. rate 18, height 5\' 7"  (1.702 m), weight 126 lb 6.4 oz (57.3 kg), SpO2 100 %.  LABORATORY DATA: Lab Results  Component Value Date   WBC 3.8 (L) 08/25/2018   HGB 14.4 08/25/2018   HCT 44.2 08/25/2018   MCV 79.1 (L) 08/25/2018   PLT 57 (L) 08/25/2018      Chemistry      Component Value Date/Time   NA 138 07/02/2016 0809   K 3.6 07/02/2016 0809   CL 104 06/18/2016 0840   CO2 20 (L) 07/02/2016 0809   BUN 10.3 07/02/2016 0809   CREATININE 1.2 07/02/2016 0809      Component Value Date/Time   CALCIUM 9.1 07/02/2016 0809   ALKPHOS 67 07/02/2016 0809   AST 42 (H) 07/02/2016 0809   ALT 117 (H) 07/02/2016 0809   BILITOT <0.30 07/02/2016 0809     Iron study and ferritin are still pending.  RADIOGRAPHIC STUDIES: No results found.  ASSESSMENT AND PLAN:  This is a very pleasant 57 years old African-American male with iron deficiency anemia secondary to chronic GI blood loss in addition to thalassemia minor.  The patient also underwent banding for internal hemorrhoids. The patient is currently on oral iron tablets and he is tolerating it well.  CBC today showed significant improvement in his hemoglobin and hematocrit.  He continues to have mild leukopenia as well as thrombocytopenia. I recommended for the patient to continue with oral iron tablets for now. I will see him back for follow-up visit in 6 months for evaluation with repeat CBC, iron study and ferritin. 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  We will continue to monitor for now. The patient was advised to call immediately if he has any concerning symptoms in the interval especially any bleeding issues. The patient voices understanding of current disease status and treatment options and is in agreement with the current care plan. All questions were answered. The patient knows to call the  clinic with any problems, questions or concerns. We can certainly see the patient much sooner if necessary. I spent 10 minutes counseling the patient face to face. The total time spent in the appointment was 15 minutes.  Disclaimer: This note was dictated with voice recognition software. Similar sounding words can inadvertently be transcribed and may not be corrected upon review.

## 2018-08-27 ENCOUNTER — Ambulatory Visit (INDEPENDENT_AMBULATORY_CARE_PROVIDER_SITE_OTHER): Payer: Medicare HMO | Admitting: Internal Medicine

## 2018-08-27 ENCOUNTER — Other Ambulatory Visit (INDEPENDENT_AMBULATORY_CARE_PROVIDER_SITE_OTHER): Payer: Medicare HMO

## 2018-08-27 ENCOUNTER — Encounter: Payer: Self-pay | Admitting: Internal Medicine

## 2018-08-27 VITALS — BP 130/80 | HR 79 | Temp 98.6°F | Ht 67.0 in | Wt 128.0 lb

## 2018-08-27 DIAGNOSIS — Z0000 Encounter for general adult medical examination without abnormal findings: Secondary | ICD-10-CM | POA: Diagnosis not present

## 2018-08-27 DIAGNOSIS — Z1159 Encounter for screening for other viral diseases: Secondary | ICD-10-CM

## 2018-08-27 DIAGNOSIS — R51 Headache: Secondary | ICD-10-CM | POA: Diagnosis not present

## 2018-08-27 DIAGNOSIS — I10 Essential (primary) hypertension: Secondary | ICD-10-CM

## 2018-08-27 DIAGNOSIS — Z23 Encounter for immunization: Secondary | ICD-10-CM

## 2018-08-27 DIAGNOSIS — R519 Headache, unspecified: Secondary | ICD-10-CM

## 2018-08-27 LAB — LIPID PANEL
CHOLESTEROL: 158 mg/dL (ref 0–200)
HDL: 53.3 mg/dL (ref >39.00)
LDL Cholesterol: 83 mg/dL (ref 0–99)
NONHDL: 104.49
Total CHOL/HDL Ratio: 3
Triglycerides: 105 mg/dL (ref 0.0–149.0)
VLDL: 21 mg/dL (ref 0.0–40.0)

## 2018-08-27 LAB — HEPATITIS C ANTIBODY
HEP C AB: NONREACTIVE
SIGNAL TO CUT-OFF: 0.03 (ref <1.00)

## 2018-08-27 NOTE — Assessment & Plan Note (Signed)
He is well controlled on atenolol and topamax and satisfied with level of control.

## 2018-08-27 NOTE — Assessment & Plan Note (Signed)
Taking atenolol and BP at goal. Recent CMP without indication for change.

## 2018-08-27 NOTE — Assessment & Plan Note (Addendum)
Flu shot given. Shingrix counseled. Tetanus given. Colonoscopy up to date. Counseled about sun safety and mole surveillance. Counseled about the dangers of distracted driving. Given 10 year screening recommendations. Checking hep c screening today.

## 2018-08-27 NOTE — Patient Instructions (Signed)
We are checking the labs today and will call you back about results.    Health Maintenance, Male A healthy lifestyle and preventive care is important for your health and wellness. Ask your health care provider about what schedule of regular examinations is right for you. What should I know about weight and diet? Eat a Healthy Diet  Eat plenty of vegetables, fruits, whole grains, low-fat dairy products, and lean protein.  Do not eat a lot of foods high in solid fats, added sugars, or salt.  Maintain a Healthy Weight Regular exercise can help you achieve or maintain a healthy weight. You should:  Do at least 150 minutes of exercise each week. The exercise should increase your heart rate and make you sweat (moderate-intensity exercise).  Do strength-training exercises at least twice a week.  Watch Your Levels of Cholesterol and Blood Lipids  Have your blood tested for lipids and cholesterol every 5 years starting at 57 years of age. If you are at high risk for heart disease, you should start having your blood tested when you are 57 years old. You may need to have your cholesterol levels checked more often if: ? Your lipid or cholesterol levels are high. ? You are older than 57 years of age. ? You are at high risk for heart disease.  What should I know about cancer screening? Many types of cancers can be detected early and may often be prevented. Lung Cancer  You should be screened every year for lung cancer if: ? You are a current smoker who has smoked for at least 30 years. ? You are a former smoker who has quit within the past 15 years.  Talk to your health care provider about your screening options, when you should start screening, and how often you should be screened.  Colorectal Cancer  Routine colorectal cancer screening usually begins at 57 years of age and should be repeated every 5-10 years until you are 57 years old. You may need to be screened more often if early forms  of precancerous polyps or small growths are found. Your health care provider may recommend screening at an earlier age if you have risk factors for colon cancer.  Your health care provider may recommend using home test kits to check for hidden blood in the stool.  A small camera at the end of a tube can be used to examine your colon (sigmoidoscopy or colonoscopy). This checks for the earliest forms of colorectal cancer.  Prostate and Testicular Cancer  Depending on your age and overall health, your health care provider may do certain tests to screen for prostate and testicular cancer.  Talk to your health care provider about any symptoms or concerns you have about testicular or prostate cancer.  Skin Cancer  Check your skin from head to toe regularly.  Tell your health care provider about any new moles or changes in moles, especially if: ? There is a change in a mole's size, shape, or color. ? You have a mole that is larger than a pencil eraser.  Always use sunscreen. Apply sunscreen liberally and repeat throughout the day.  Protect yourself by wearing long sleeves, pants, a wide-brimmed hat, and sunglasses when outside.  What should I know about heart disease, diabetes, and high blood pressure?  If you are 32-39 years of age, have your blood pressure checked every 3-5 years. If you are 11 years of age or older, have your blood pressure checked every year. You should have  your blood pressure measured twice-once when you are at a hospital or clinic, and once when you are not at a hospital or clinic. Record the average of the two measurements. To check your blood pressure when you are not at a hospital or clinic, you can use: ? An automated blood pressure machine at a pharmacy. ? A home blood pressure monitor.  Talk to your health care provider about your target blood pressure.  If you are between 45-79 years old, ask your health care provider if you should take aspirin to prevent heart  disease.  Have regular diabetes screenings by checking your fasting blood sugar level. ? If you are at a normal weight and have a low risk for diabetes, have this test once every three years after the age of 45. ? If you are overweight and have a high risk for diabetes, consider being tested at a younger age or more often.  A one-time screening for abdominal aortic aneurysm (AAA) by ultrasound is recommended for men aged 65-75 years who are current or former smokers. What should I know about preventing infection? Hepatitis B If you have a higher risk for hepatitis B, you should be screened for this virus. Talk with your health care provider to find out if you are at risk for hepatitis B infection. Hepatitis C Blood testing is recommended for:  Everyone born from 1945 through 1965.  Anyone with known risk factors for hepatitis C.  Sexually Transmitted Diseases (STDs)  You should be screened each year for STDs including gonorrhea and chlamydia if: ? You are sexually active and are younger than 57 years of age. ? You are older than 57 years of age and your health care provider tells you that you are at risk for this type of infection. ? Your sexual activity has changed since you were last screened and you are at an increased risk for chlamydia or gonorrhea. Ask your health care provider if you are at risk.  Talk with your health care provider about whether you are at high risk of being infected with HIV. Your health care provider may recommend a prescription medicine to help prevent HIV infection.  What else can I do?  Schedule regular health, dental, and eye exams.  Stay current with your vaccines (immunizations).  Do not use any tobacco products, such as cigarettes, chewing tobacco, and e-cigarettes. If you need help quitting, ask your health care provider.  Limit alcohol intake to no more than 2 drinks per day. One drink equals 12 ounces of beer, 5 ounces of wine, or 1 ounces of  hard liquor.  Do not use street drugs.  Do not share needles.  Ask your health care provider for help if you need support or information about quitting drugs.  Tell your health care provider if you often feel depressed.  Tell your health care provider if you have ever been abused or do not feel safe at home. This information is not intended to replace advice given to you by your health care provider. Make sure you discuss any questions you have with your health care provider. Document Released: 05/23/2008 Document Revised: 07/24/2016 Document Reviewed: 08/29/2015 Elsevier Interactive Patient Education  2018 [employer]  

## 2018-08-27 NOTE — Progress Notes (Signed)
   Subjective:    Patient ID: Vincent [name], male    DOB: Dec 12, 1960, 57 y.o.   MRN: [id number]  HPI The patient is a 57 YO man coming in for physical.   PMH, [facility], social history reviewed and updated.   Review of Systems  Constitutional: Negative.   HENT: Negative.   Eyes: Negative.   Respiratory: Negative for cough, chest tightness and shortness of breath.   Cardiovascular: Negative for chest pain, palpitations and leg swelling.  Gastrointestinal: Negative for abdominal distention, abdominal pain, constipation, diarrhea, nausea and vomiting.  Musculoskeletal: Negative.   Skin: Negative.   Neurological: Negative.   Psychiatric/Behavioral: Negative.       Objective:   Physical Exam  Constitutional: He is oriented to person, place, and time. He appears well-developed and well-nourished.  thin  HENT:  Head: Normocephalic and atraumatic.  Eyes: EOM are normal.  Neck: Normal range of motion.  Cardiovascular: Normal rate and regular rhythm.  Pulmonary/Chest: Effort normal and breath sounds normal. No respiratory distress. He has no wheezes. He has no rales.  Abdominal: Soft. Bowel sounds are normal. He exhibits no distension. There is no tenderness. There is no rebound.  Musculoskeletal: He exhibits no edema.  Neurological: He is alert and oriented to person, place, and time. Coordination normal.  Skin: Skin is warm and dry.  Psychiatric: He has a normal mood and affect.   Vitals:   08/27/18 0840  BP: 130/80  Pulse: 79  Temp: 98.6 F (37 C)  TempSrc: Oral  SpO2: 99%  Weight: 128 lb (58.1 kg)  Height: 5\' 7"  (1.702 m)      Assessment & Plan:  Tdap and flu given at visit

## 2018-11-17 ENCOUNTER — Ambulatory Visit (INDEPENDENT_AMBULATORY_CARE_PROVIDER_SITE_OTHER): Payer: Medicare HMO | Admitting: Internal Medicine

## 2018-11-17 ENCOUNTER — Ambulatory Visit (INDEPENDENT_AMBULATORY_CARE_PROVIDER_SITE_OTHER)
Admission: RE | Admit: 2018-11-17 | Discharge: 2018-11-17 | Disposition: A | Discharge status: Home / Self Care | Payer: Medicare HMO | Source: Ambulatory Visit | Attending: Internal Medicine | Admitting: Internal Medicine

## 2018-11-17 ENCOUNTER — Encounter: Payer: Self-pay | Admitting: Internal Medicine

## 2018-11-17 VITALS — BP 150/80 | HR 96 | Temp 98.3°F | Ht 67.0 in | Wt 124.0 lb

## 2018-11-17 DIAGNOSIS — M545 Low back pain, unspecified: Secondary | ICD-10-CM

## 2018-11-17 MED ORDER — PREDNISONE 20 MG PO TABS: 40.0000 mg | ORAL_TABLET | Freq: Every day | ORAL | 0 refills | Status: DC

## 2018-11-17 MED ORDER — CYCLOBENZAPRINE HCL 5 MG PO TABS: 5.0000 mg | ORAL_TABLET | Freq: Three times a day (TID) | ORAL | 1 refills | Status: AC | PRN

## 2018-11-17 MED ORDER — KETOROLAC TROMETHAMINE 30 MG/ML IJ SOLN
30.0000 mg | Freq: Once | INTRAMUSCULAR | Status: AC
  Administered 2018-11-17: 30 mg via INTRAMUSCULAR

## 2018-11-17 MED ORDER — METHYLPREDNISOLONE ACETATE 40 MG/ML IJ SUSP
40.0000 mg | Freq: Once | INTRAMUSCULAR | Status: AC
  Administered 2018-11-17: 40 mg via INTRAMUSCULAR

## 2018-11-17 NOTE — Patient Instructions (Signed)
We have given you two shots today to help with the pain.   We have sent in 2 medicines which should help with pain.   The first medicine is prednisone to help. Take 2 pills daily for 5 days.   The other medicine is for pain and you can take 1 pill 3 times a day as needed which is called flexeril.

## 2018-11-17 NOTE — Assessment & Plan Note (Signed)
Given the chronicity and severity needs x-ray today lumbar. Given depo-medrol 40 mg IM and toradol 30 mg IM today for pain. Rx for prednisone burst and flexeril. Depending on x-ray results may need to see neurosurgery for definitive treatment.

## 2018-11-17 NOTE — Progress Notes (Signed)
   Subjective:    Patient ID: Vincent [name], male    DOB: [birth year], 57 y.o.   MRN: [id number]  HPI The patient is a 57 YO man coming in for low back pain. Started about 2 months ago although it is worsening since then. Overall it is worsening and is severe. He has tried otc tylenol and ibuprofen which has not helped. He rates the pain 10/10 pain most of the time. He cannot find a comfortable position. Standing hurts, sitting hurts and lying down also hurts. He is hurting in the middle of the back. Denies injury, overuse, or accident prior to onset. Has previously had neck problems but his neck is not hurting now. Denies numbness or weakness in legs. Denies that the pain moves into his stomach or legs from the back.   Review of Systems  Constitutional: Positive for activity change. Negative for appetite change, fatigue, fever and unexpected weight change.  Respiratory: Negative.   Cardiovascular: Negative.   Musculoskeletal: Positive for back pain and myalgias. Negative for arthralgias, gait problem, joint swelling, neck pain and neck stiffness.  Skin: Negative.   Neurological: Negative for syncope, weakness and numbness.      Objective:   Physical Exam  Constitutional: He is oriented to person, place, and time. He appears well-developed and well-nourished. He appears distressed.  Appears in pain during the visit  HENT:  Head: Normocephalic and atraumatic.  Eyes: EOM are normal.  Neck: Normal range of motion.  Cardiovascular: Normal rate and regular rhythm.  Pulmonary/Chest: Effort normal and breath sounds normal. No respiratory distress. He has no wheezes. He has no rales.  Abdominal: Soft. He exhibits no distension. There is no tenderness. There is no rebound.  Musculoskeletal: He exhibits tenderness. He exhibits no edema.  Pain in the lumbar and sacral region midline, some paraspinally bilaterally. No radiation and no pain in the thoracic or cervical region.   Neurological: He is alert  and oriented to person, place, and time. Coordination normal.  Skin: Skin is warm and dry.  Psychiatric: He has a normal mood and affect.   Vitals:   11/17/18 0756  BP: (!) 150/80  Pulse: 96  Temp: 98.3 F (36.8 C)  TempSrc: Oral  SpO2: 98%  Weight: 124 lb (56.2 kg)  Height: 5\' 7"  (1.702 m)      Assessment & Plan:  Depo-medrol 40 mg IM and toradol 30 mg IM given at visit

## 2018-11-20 ENCOUNTER — Other Ambulatory Visit: Payer: Self-pay | Admitting: Internal Medicine

## 2019-02-23 ENCOUNTER — Inpatient Hospital Stay: Payer: Medicare HMO | Admitting: Internal Medicine

## 2019-02-23 ENCOUNTER — Inpatient Hospital Stay: Payer: Medicare HMO

## 2019-03-19 ENCOUNTER — Telehealth: Payer: Self-pay | Admitting: Internal Medicine

## 2019-03-19 NOTE — Telephone Encounter (Signed)
Per reschedule list moved 4/20 f/u to 4/22 as webex - keep 4/20 lab. Left message for patient.

## 2019-03-29 ENCOUNTER — Telehealth: Payer: Self-pay | Admitting: Internal Medicine

## 2019-03-29 ENCOUNTER — Inpatient Hospital Stay: Payer: Medicare HMO | Attending: Internal Medicine

## 2019-03-29 ENCOUNTER — Ambulatory Visit: Payer: Medicare HMO | Admitting: Internal Medicine

## 2019-03-29 ENCOUNTER — Other Ambulatory Visit: Payer: Self-pay

## 2019-03-29 DIAGNOSIS — D500 Iron deficiency anemia secondary to blood loss (chronic): Secondary | ICD-10-CM | POA: Diagnosis not present

## 2019-03-29 LAB — IRON AND TIBC
Iron: 185 ug/dL — ABNORMAL HIGH (ref 42–163)
Saturation Ratios: 65 % — ABNORMAL HIGH (ref 20–55)
TIBC: 285 ug/dL (ref 202–409)
UIBC: 101 ug/dL — ABNORMAL LOW (ref 117–376)

## 2019-03-29 LAB — CBC WITH DIFFERENTIAL (CANCER CENTER ONLY)
Abs Immature Granulocytes: 0.01 10*3/uL (ref 0.00–0.07)
Basophils Absolute: 0.1 10*3/uL (ref 0.0–0.1)
Basophils Relative: 2 %
Eosinophils Absolute: 0.1 10*3/uL (ref 0.0–0.5)
Eosinophils Relative: 2 %
HCT: 37.4 % — ABNORMAL LOW (ref 39.0–52.0)
Hemoglobin: 11.8 g/dL — ABNORMAL LOW (ref 13.0–17.0)
Immature Granulocytes: 0 %
Lymphocytes Relative: 35 %
Lymphs Abs: 1.4 10*3/uL (ref 0.7–4.0)
MCH: 25.5 pg — ABNORMAL LOW (ref 26.0–34.0)
MCHC: 31.6 g/dL (ref 30.0–36.0)
MCV: 80.8 fL (ref 80.0–100.0)
Monocytes Absolute: 0.6 10*3/uL (ref 0.1–1.0)
Monocytes Relative: 16 %
Neutro Abs: 1.7 10*3/uL (ref 1.7–7.7)
Neutrophils Relative %: 45 %
Platelet Count: 105 10*3/uL — ABNORMAL LOW (ref 150–400)
RBC: 4.63 MIL/uL (ref 4.22–5.81)
RDW: 14.5 % (ref 11.5–15.5)
WBC Count: 3.9 10*3/uL — ABNORMAL LOW (ref 4.0–10.5)
nRBC: 0 % (ref 0.0–0.2)

## 2019-03-29 LAB — FERRITIN: Ferritin: 34 ng/mL (ref 24–336)

## 2019-03-29 NOTE — Telephone Encounter (Signed)
Called patient regarding upcoming Webex appointment, left patient a voicemail and this will be a telephone visit due to the patient not having an e-mail listed.

## 2019-03-29 NOTE — Telephone Encounter (Signed)
Patient called back regarding voicemail, patient does not have access for Webex and would prefer 04/22 to be a telephone visit.

## 2019-03-31 ENCOUNTER — Encounter: Payer: Self-pay | Admitting: Internal Medicine

## 2019-03-31 ENCOUNTER — Inpatient Hospital Stay (HOSPITAL_BASED_OUTPATIENT_CLINIC_OR_DEPARTMENT_OTHER): Payer: Medicare HMO | Admitting: Internal Medicine

## 2019-03-31 DIAGNOSIS — I10 Essential (primary) hypertension: Secondary | ICD-10-CM | POA: Diagnosis not present

## 2019-03-31 DIAGNOSIS — D508 Other iron deficiency anemias: Secondary | ICD-10-CM

## 2019-03-31 DIAGNOSIS — D500 Iron deficiency anemia secondary to blood loss (chronic): Secondary | ICD-10-CM

## 2019-03-31 DIAGNOSIS — Z79899 Other long term (current) drug therapy: Secondary | ICD-10-CM

## 2019-03-31 DIAGNOSIS — D696 Thrombocytopenia, unspecified: Secondary | ICD-10-CM | POA: Diagnosis not present

## 2019-03-31 NOTE — Progress Notes (Signed)
[facility] [phone]   [phone]  PROGRESS NOTE FOR TELEMEDICINE VISITS  [name], MD [place] [id number]  I connected with@ on 03/31/19 at 10:45 AM EDT by telephone visit and verified that I am speaking with the correct person using two identifiers.   I discussed the limitations, risks, security and privacy concerns of performing an evaluation and management service by telemedicine and the availability of in-person appointments. I also discussed with the patient that there may be a patient responsible charge related to this service. The patient expressed understanding and agreed to proceed.  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  PRIOR THERAPY: Feraheme 510 mg IV 2 doses last dose was given on 08/16/2015.  CURRENT THERAPY: Oral iron tablets over-the-counter.  INTERVAL HISTORY: Vincent [name] 58 y.o. male has a telephone virtual visit with me today for evaluation and discussion of his lab results.  The patient is feeling fine today with no concerning complaints.  He denied having any fatigue or weakness.  He denied having any dizzy spells.  He has no nausea, vomiting, diarrhea or constipation.  He denied headache or visual changes.  He has no chest pain, shortness of breath, cough or hemoptysis.  He is currently on oral iron tablets 1 tablets p.o. daily.  He is to take 2 tablets every day but reduced his dose to 1 tablet daily because of black stool.  The patient had repeat CBC, iron study and ferritin performed recently and we are having the visit for evaluation and discussion of his lab results.  MEDICAL HISTORY: Past Medical History:  Diagnosis Date  . Anemia   . Blood transfusion without  reported diagnosis   . CAP (community acquired pneumonia)   . COPD (chronic obstructive pulmonary disease) ([place])   . Essential hypertension 07/05/2013   Chronic    . Glaucoma   . H. pylori infection   . Hyperplastic colon polyp   . Hypertension   . Internal hemorrhoids   . Iron deficiency anemia due to chronic blood loss 08/02/2015    9/16 The patient had an iron infusion (Ferumoxytol) this morning and left about a half hour later and was fine. Now he is home and he is breaking out in sweat, nauseated and dizzy when standing. His BP is 160/86 and HR is between 90-96". It was his second and last iron infusion.  Likely side effects w/Ferumoxytol IV.    Marland Kitchen Severe protein-calorie malnutrition ([place])   . Thalassemia minor 08/02/2015  . Tobacco use disorder 04/10/2015    ALLERGIES:  has No Known Allergies.  MEDICATIONS:  Current Outpatient Medications  Medication Sig Dispense Refill  . albuterol (PROVENTIL HFA;VENTOLIN HFA) 108 (90 Base) MCG/ACT inhaler Inhale 2 puffs every 6 (six) hours as needed into the lungs for wheezing or shortness of breath. Needs annual visit for further refills 1 Inhaler 0  . atenolol (TENORMIN) 25 MG tablet TAKE 1 TABLET BY MOUTH EVERY DAY 90 tablet 1  . cyclobenzaprine (FLEXERIL) 5 MG tablet Take 1 tablet (5 mg total) by mouth 3 (three) times daily as needed for muscle spasms. 60 tablet 1  . ferrous sulfate 325 (65 FE) MG tablet Take 325 mg by mouth daily with breakfast.    . latanoprost (XALATAN) 0.005 % ophthalmic solution INSTILL 1  DROP IN RIGHT EYE NIGHTLY  12  . predniSONE (DELTASONE) 20 MG tablet Take 2 tablets (40 mg total) by mouth daily with breakfast. 10 tablet 0  . sildenafil (VIAGRA) 100 MG tablet Take 0.5-1 tablets (50-100 mg total) by mouth daily as needed for erectile dysfunction. 10 tablet 11  . SUMAtriptan (IMITREX) 100 MG tablet TAKE 1 TABLET BY MOUTH EVERY 2 HOURS AS NEEDED FOR MIGRAINE OR HEADACHE. MAY REPEAT IN 2 HOURS 10 tablet 11  . topiramate  (TOPAMAX) 50 MG tablet Take 1 tablet (50 mg total) by mouth daily. 30 tablet 3  . umeclidinium-vilanterol (ANORO ELLIPTA) 62.5-25 MCG/INH AEPB Inhale 1 puff into the lungs daily. 30 each 6   No current facility-administered medications for this visit.     SURGICAL HISTORY:  Past Surgical History:  Procedure Laterality Date  . plate in neck    . right shoulder arthroscopy      REVIEW OF SYSTEMS:  A comprehensive review of systems was negative.   LABORATORY DATA: Lab Results  Component Value Date   WBC 3.9 (L) 03/29/2019   HGB 11.8 (L) 03/29/2019   HCT 37.4 (L) 03/29/2019   MCV 80.8 03/29/2019   PLT 105 (L) 03/29/2019      Chemistry      Component Value Date/Time   NA 138 07/02/2016 0809   K 3.6 07/02/2016 0809   CL 104 06/18/2016 0840   CO2 20 (L) 07/02/2016 0809   BUN 10.3 07/02/2016 0809   CREATININE 1.2 07/02/2016 0809      Component Value Date/Time   CALCIUM 9.1 07/02/2016 0809   ALKPHOS 67 07/02/2016 0809   AST 42 (H) 07/02/2016 0809   ALT 117 (H) 07/02/2016 0809   BILITOT <0.30 07/02/2016 0809       RADIOGRAPHIC STUDIES: No results found.  ASSESSMENT AND PLAN:  This is a very pleasant 58 years old African-American male with iron deficiency anemia secondary to chronic GI blood loss in addition to thalassemia minor.  The patient also underwent banding for internal hemorrhoids. He is currently on over-the-counter oral iron tablets 1 tablet p.o. daily.  He is tolerating his treatment well with no concerning adverse effects. Repeat CBC, iron study and ferritin showed mild anemia but normal iron study and ferritin. 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  His platelet count has improved compared to 6 months ago. I recommended for the patient to continue on the oral iron tablets for now and increase it to 2 tablets few days a week. I will see the patient back for follow-up visit in 6 months for evaluation and repeat CBC, iron study and  ferritin. He was advised to call immediately if he has any concerning symptoms in the interval. I discussed the assessment and treatment plan with the patient. The patient was provided an opportunity to ask questions and all were answered. The patient agreed with the plan and demonstrated an understanding of the instructions.   The patient was advised to call back or seek an in-person evaluation if the symptoms worsen or if the condition fails to improve as anticipated.  I provided 11 minutes of non face-to-face telephone visit time during this encounter, and > 50% was spent counseling as documented under my assessment & plan.  Eilleen Kempf, MD 03/31/2019 10:51 AM  Disclaimer: This note was dictated with voice recognition software. Similar sounding words can inadvertently be transcribed and may not be corrected upon review.

## 2019-04-01 ENCOUNTER — Telehealth: Payer: Self-pay | Admitting: Internal Medicine

## 2019-04-01 NOTE — Telephone Encounter (Signed)
Tried to reach regarding schedule °

## 2019-05-18 ENCOUNTER — Other Ambulatory Visit: Payer: Self-pay | Admitting: Internal Medicine

## 2019-05-25 ENCOUNTER — Other Ambulatory Visit: Payer: Self-pay | Admitting: Internal Medicine

## 2019-06-11 ENCOUNTER — Other Ambulatory Visit: Payer: Self-pay | Admitting: Internal Medicine

## 2019-07-08 NOTE — Progress Notes (Signed)
Subjective:   Vincent [name] is a 58 y.o. male who presents for Medicare Annual/Subsequent preventive examination.  Review of Systems:  Cardiac Risk Factors include: advanced age (>36men, >51 women);hypertension;male gender Sleep patterns: gets up 1-2 times nightly to void and sleeps 5-6 hours nightly. Patient reports insomnia issues, discussed recommended sleep tips, education was attached to patient's AVS.  Home Safety/Smoke Alarms: Feels safe in home. Smoke alarms in place.  Living environment; residence and Firearm Safety: 1-story house/ trailer. Lives with wife, no needs for DME, good support system Seat Belt Safety/Bike Helmet: Wears seat belt.      Objective:    Vitals: BP 122/62   Pulse 84   Resp 17   Ht 5\' 7"  (1.702 m)   Wt 127 lb (57.6 kg)   SpO2 99%   BMI 19.89 kg/m   Body mass index is 19.89 kg/m.  Advanced Directives 08/25/2018 07/07/2018 02/23/2018 07/02/2017 01/06/2017 12/29/2015 09/27/2015  Does Patient Have a Medical Advance Directive? No No No No No No No  Would patient like information on creating a medical advance directive? - Yes (ED - Information included in AVS) - - - - No - patient declined information    Tobacco Social History   Tobacco Use  Smoking Status Former Smoker  . Packs/day: 0.50  . Years: 40.00  . Pack years: 20.00  . Types: Cigarettes  . Quit date: 03/10/2015  . Years since quitting: 4.3  Smokeless Tobacco Never Used     Counseling given: Not Answered  Past Medical History:  Diagnosis Date  . Anemia   . Blood transfusion without reported diagnosis   . CAP (community acquired pneumonia)   . COPD (chronic obstructive pulmonary disease) ([place])   . Essential hypertension 07/05/2013   Chronic    . Glaucoma   . H. pylori infection   . Hyperplastic colon polyp   . Hypertension   . Internal hemorrhoids   . Iron deficiency anemia due to chronic blood loss 08/02/2015    9/16 The patient had an iron infusion (Ferumoxytol) this morning and left  about a half hour later and was fine. Now he is home and he is breaking out in sweat, nauseated and dizzy when standing. His BP is 160/86 and HR is between 90-96". It was his second and last iron infusion.  Likely side effects w/Ferumoxytol IV.    Marland Kitchen Severe protein-calorie malnutrition ([place])   . Thalassemia minor 08/02/2015  . Tobacco use disorder 04/10/2015   Past Surgical History:  Procedure Laterality Date  . plate in neck    . right shoulder arthroscopy     Family History  Problem Relation Age of Onset  . Heart disease Father   . Sickle cell anemia Mother        deceased  . Diabetes Sister   . Stomach cancer Neg Hx   . Esophageal cancer Neg Hx   . Pancreatic cancer Neg Hx   . Colon cancer Neg Hx   . Liver disease Neg Hx    Social History   Socioeconomic History  . Marital status: Married    Spouse name: Not on file  . Number of children: 2  . Years of education: Not on file  . Highest education level: Not on file  Occupational History  . Occupation: disabled  Social Needs  . Financial resource strain: Not very hard  . Food insecurity    Worry: Never true    Inability: Never true  . Transportation needs  Medical: No    Non-medical: No  Tobacco Use  . Smoking status: Former Smoker    Packs/day: 0.50    Years: 40.00    Pack years: 20.00    Types: Cigarettes    Quit date: 03/10/2015    Years since quitting: 4.3  . Smokeless tobacco: Never Used  Substance and Sexual Activity  . Alcohol use: Yes    Alcohol/week: 0.0 standard drinks    Comment: occasionally   . Drug use: No  . Sexual activity: Yes  Lifestyle  . Physical activity    Days per week: 4 days    Minutes per session: 40 min  . Stress: Not at all  Relationships  . Social connections    Talks on phone: More than three times a week    Gets together: More than three times a week    Attends religious service: More than 4 times per year    Active member of club or organization: Yes    Attends meetings of  clubs or organizations: More than 4 times per year    Relationship status: Married  Other Topics Concern  . Not on file  Social History Narrative  . Not on file    Outpatient Encounter Medications as of 07/09/2019  Medication Sig  . acetaminophen (TYLENOL) 500 MG tablet Take 500 mg by mouth every 6 (six) hours as needed.  Marland Kitchen albuterol (VENTOLIN HFA) 108 (90 Base) MCG/ACT inhaler INHALE 2 PUFFS EVERY 6 HOURS AS NEEDED INTO THE LUNGS FOR WHEEZING OR SHORTNESS OF BREATH.  Marland Kitchen atenolol (TENORMIN) 25 MG tablet TAKE 1 TABLET BY MOUTH EVERY DAY  . cyclobenzaprine (FLEXERIL) 5 MG tablet Take 1 tablet (5 mg total) by mouth 3 (three) times daily as needed for muscle spasms.  . ferrous sulfate 325 (65 FE) MG tablet Take 325 mg by mouth daily with breakfast.  . latanoprost (XALATAN) 0.005 % ophthalmic solution INSTILL 1 DROP IN RIGHT EYE NIGHTLY  . sildenafil (VIAGRA) 100 MG tablet Take 0.5-1 tablets (50-100 mg total) by mouth daily as needed for erectile dysfunction.  . SUMAtriptan (IMITREX) 100 MG tablet TAKE 1 TABLET BY MOUTH EVERY 2 HOURS AS NEEDED FOR MIGRAINE OR HEADACHE. MAY REPEAT IN 2 HOURS  . topiramate (TOPAMAX) 50 MG tablet Take 1 tablet (50 mg total) by mouth daily.  Marland Kitchen umeclidinium-vilanterol (ANORO ELLIPTA) 62.5-25 MCG/INH AEPB Inhale 1 puff into the lungs daily.  . [DISCONTINUED] predniSONE (DELTASONE) 20 MG tablet Take 2 tablets (40 mg total) by mouth daily with breakfast. (Patient not taking: Reported on 07/09/2019)   No facility-administered encounter medications on file as of 07/09/2019.     Activities of Daily Living In your present state of health, do you have any difficulty performing the following activities: 07/09/2019  Hearing? N  Vision? N  Difficulty concentrating or making decisions? N  Walking or climbing stairs? N  Dressing or bathing? N  Doing errands, shopping? N  Preparing Food and eating ? N  Using the Toilet? N  In the past six months, have you accidently leaked  urine? N  Do you have problems with loss of bowel control? N  Managing your Medications? N  Managing your Finances? N  Housekeeping or managing your Housekeeping? N  Some recent data might be hidden    Patient Care Team: [name], MD as PCP - General (Internal Medicine)   Assessment:   This is a routine wellness examination for Haydyn. Physical assessment deferred to PCP.   Exercise Activities  and Dietary recommendations Current Exercise Habits: Home exercise routine, Type of exercise: walking, Time (Minutes): 35, Frequency (Times/Week): 6, Weekly Exercise (Minutes/Week): 210, Intensity: Mild, Exercise limited by: None identified  Diet (meal preparation, eat out, water intake, caffeinated beverages, dairy products, fruits and vegetables): in general, a "healthy" diet  .  Reports poor appetite at times and weight loss issues.    Discussed supplementing with Ensure, samples and coupons provided. Encouraged patient to increase daily water and healthy fluid intake.  Goals    . Be as active and healthy as possible     Continue to walk daily, help around the house, do yard work, eat well with a lot of fruits and vegetables, enjoy life and family    . 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       Fall Risk Fall Risk  07/07/2018 07/02/2017 07/05/2013  Falls in the past year? No Yes No  Number falls in past yr: - 2 or more -  Risk for fall due to : - Impaired balance/gait;Impaired mobility;Impaired vision -    Depression Screen PHQ 2/9 Scores 07/07/2018 07/02/2017 07/05/2013  PHQ - 2 Score 0 2 0  PHQ- 9 Score 1 6 -    Cognitive Function       Ad8 score reviewed for issues:  Issues making decisions: no  Less interest in hobbies / activities: no  Repeats questions, stories (family complaining): no  Trouble using ordinary gadgets (microwave, computer, phone):no  Forgets the month or year: no   Mismanaging finances: no  Remembering appts: no  Daily problems with thinking and/or memory: no Ad8 score is= 0  Immunization History  Administered Date(s) Administered  . Influenza, High Dose Seasonal PF 08/27/2018  . Influenza,inj,Quad PF,6+ Mos 08/17/2014, 10/04/2016, 10/17/2017  . Pneumococcal Polysaccharide-23 04/09/2015  . Tdap 12/10/2007, 08/27/2018   Screening Tests Health Maintenance  Topic Date Due  . INFLUENZA VACCINE  07/10/2019  . COLONOSCOPY  09/10/2025  . TETANUS/TDAP  08/27/2028  . Hepatitis C Screening  Completed  . HIV Screening  Completed       Plan:    Reviewed health maintenance screenings with patient today and relevant education, vaccines, and/or referrals were provided.   Continue to eat heart healthy diet (full of fruits, vegetables, whole grains, lean protein, water--limit salt, fat, and sugar intake) and increase physical activity as tolerated.  Continue doing brain stimulating activities (puzzles, reading, adult coloring books, staying active) to keep memory sharp.   I have personally reviewed and noted the following in the patient's chart:   . Medical and social history . Use of alcohol, tobacco or illicit drugs  . Current medications and supplements . Functional ability and status . Nutritional status . Physical activity . Advanced directives . List of other physicians . Vitals . Screenings to include cognitive, depression, and falls . Referrals and appointments  In addition, I have reviewed and discussed with patient certain preventive protocols, quality metrics, and best practice recommendations. A written personalized care plan for preventive services as well as general preventive health recommendations were provided to patient.     Michiel Cowboy, RN  07/09/2019

## 2019-07-09 ENCOUNTER — Other Ambulatory Visit: Payer: Self-pay

## 2019-07-09 ENCOUNTER — Ambulatory Visit (INDEPENDENT_AMBULATORY_CARE_PROVIDER_SITE_OTHER): Payer: Medicare HMO | Admitting: *Deleted

## 2019-07-09 ENCOUNTER — Telehealth: Payer: Self-pay | Admitting: *Deleted

## 2019-07-09 VITALS — BP 122/62 | HR 84 | Resp 17 | Ht 67.0 in | Wt 127.0 lb

## 2019-07-09 DIAGNOSIS — Z0000 Encounter for general adult medical examination without abnormal findings: Secondary | ICD-10-CM

## 2019-07-09 NOTE — Patient Instructions (Addendum)
[facility] in [place], [place] in: [place] Address: [address] [place], [place] [address] Phone: [phone]  Continue to eat heart healthy diet (full of fruits, vegetables, whole grains, lean protein, water--limit salt, fat, and sugar intake) and increase physical activity as tolerated.   Mr. Leaf , Thank you for taking time to come for your Medicare Wellness Visit. I appreciate your ongoing commitment to your health goals. Please review the following plan we discussed and let me know if I can assist you in the future.   These are the goals we discussed: Goals    . Be as active and healthy as possible     Continue to walk daily, help around the house, do yard work, eat well with a lot of fruits and vegetables, enjoy life and family    . 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       This is a list of the screening recommended for you and due dates:  Health Maintenance  Topic Date Due  . Flu Shot  07/10/2019  . Colon Cancer Screening  09/10/2025  . Tetanus Vaccine  08/27/2028  .  Hepatitis C: One time screening is recommended by Center for Disease Control  (CDC) for  adults born from [birth year] through 1965.   Completed  . HIV Screening  Completed    Insomnia Insomnia is a sleep disorder that makes it difficult to fall asleep or stay asleep. Insomnia can cause fatigue, low energy, difficulty concentrating, mood swings, and poor performance at work or school. There are three different ways to classify insomnia:  Difficulty falling asleep.  Difficulty staying asleep.  Waking up too early in the morning. Any type of insomnia can be long-term (chronic) or short-term (acute). Both are common. Short-term insomnia usually lasts for three months or less. Chronic insomnia occurs at least three times a week for longer than three  months. What are the causes? Insomnia may be caused by another condition, situation, or substance, such as:  Anxiety.  Certain medicines.  Gastroesophageal reflux disease (GERD) or other gastrointestinal conditions.  Asthma or other breathing conditions.  Restless legs syndrome, sleep apnea, or other sleep disorders.  Chronic pain.  Menopause.  Stroke.  Abuse of alcohol, tobacco, or illegal drugs.  Mental health conditions, such as depression.  Caffeine.  Neurological disorders, such as Alzheimer's disease.  An overactive thyroid (hyperthyroidism). Sometimes, the cause of insomnia may not be known. What increases the risk? Risk factors for insomnia include:  Gender. Women are affected more often than men.  Age. Insomnia is more common as you get older.  Stress.  Lack of exercise.  Irregular work schedule or working night shifts.  Traveling between different time zones.  Certain medical and mental health conditions. What are the signs or symptoms? If you have insomnia, the main symptom is having trouble falling asleep or having trouble staying asleep. This may lead to other symptoms, such as:  Feeling fatigued or having low energy.  Feeling nervous about going to sleep.  Not feeling rested in the morning.  Having trouble concentrating.  Feeling irritable, anxious, or depressed. How is this diagnosed? This condition may be diagnosed based on:  Your symptoms and medical history. Your health care provider may ask about: ? Your sleep habits. ? Any medical conditions you have. ? Your mental health.  A physical exam. How is this treated? Treatment for insomnia depends on the cause. Treatment may focus on treating an underlying condition that is causing insomnia. Treatment may also include:  Medicines to help you sleep.  Counseling or therapy.  Lifestyle adjustments to help you sleep better. Follow these instructions at home: Eating and  drinking   Limit or avoid alcohol, caffeinated beverages, and cigarettes, especially close to bedtime. These can disrupt your sleep.  Do not eat a large meal or eat spicy foods right before bedtime. This can lead to digestive discomfort that can make it hard for you to sleep. Sleep habits   Keep a sleep diary to help you and your health care provider figure out what could be causing your insomnia. Write down: ? When you sleep. ? When you wake up during the night. ? How well you sleep. ? How rested you feel the next day. ? Any side effects of medicines you are taking. ? What you eat and drink.  Make your bedroom a dark, comfortable place where it is easy to fall asleep. ? Put up shades or blackout curtains to block light from outside. ? Use a white noise machine to block noise. ? Keep the temperature cool.  Limit screen use before bedtime. This includes: ? Watching TV. ? Using your smartphone, tablet, or computer.  Stick to a routine that includes going to bed and waking up at the same times every day and night. This can help you fall asleep faster. Consider making a quiet activity, such as reading, part of your nighttime routine.  Try to avoid taking naps during the day so that you sleep better at night.  Get out of bed if you are still awake after 15 minutes of trying to sleep. Keep the lights down, but try reading or doing a quiet activity. When you feel sleepy, go back to bed. General instructions  Take over-the-counter and prescription medicines only as told by your health care provider.  Exercise regularly, as told by your health care provider. Avoid exercise starting several hours before bedtime.  Use relaxation techniques to manage stress. Ask your health care provider to suggest some techniques that may work well for you. These may include: ? Breathing exercises. ? Routines to release muscle tension. ? Visualizing peaceful scenes.  Make sure that you drive carefully.  Avoid driving if you feel very sleepy.  Keep all follow-up visits as told by your health care provider. This is important. Contact a health care provider if:  You are tired throughout the day.  You have trouble in your daily routine due to sleepiness.  You continue to have sleep problems, or your sleep problems get worse. Get help right away if:  You have serious thoughts about hurting yourself or someone else. If you ever feel like you may hurt yourself or others, or have thoughts about taking your own life, get help right away. You can go to your nearest emergency department or call:  Your local emergency services (911 in the [place]  A suicide crisis helpline, such as the [facility] at 4033665768. This is open 24 hours a day. Summary  Insomnia is a sleep disorder that makes it difficult to fall asleep or stay asleep.  Insomnia can be long-term (chronic) or short-term (acute).  Treatment for insomnia depends on the cause. Treatment may focus on treating an underlying condition that is causing insomnia.  Keep a sleep diary to help you and your health care provider  figure out what could be causing your insomnia. This information is not intended to replace advice given to you by your health care provider. Make sure you discuss any questions you have with your health care provider. Document Released: 11/22/2000 Document Revised: 11/07/2017 Document Reviewed: 09/04/2017 Elsevier Patient Education  2020 [employer].

## 2019-07-09 NOTE — Progress Notes (Signed)
Medical screening examination/treatment/procedure(s) were performed by non-physician practitioner and as supervising physician I was immediately available for consultation/collaboration. I agree with above. Hadar Elgersma A Tattianna Schnarr, MD 

## 2019-07-09 NOTE — Telephone Encounter (Signed)
During AWV, patient stated that he needs a refill for Imitrex.

## 2019-08-03 ENCOUNTER — Telehealth: Payer: Self-pay | Admitting: *Deleted

## 2019-08-03 DIAGNOSIS — D563 Thalassemia minor: Secondary | ICD-10-CM

## 2019-08-03 DIAGNOSIS — D500 Iron deficiency anemia secondary to blood loss (chronic): Secondary | ICD-10-CM

## 2019-08-03 MED ORDER — SUMATRIPTAN SUCCINATE 100 MG PO TABS: ORAL_TABLET | ORAL | 0 refills | Status: DC

## 2019-08-03 NOTE — Telephone Encounter (Signed)
Sent in 1 month refill

## 2019-08-03 NOTE — Telephone Encounter (Signed)
Patient called nurse stating that he needs initrex medication refill. Nurse did schedule an appointment for patient's yearly physical on 09/01/19.

## 2019-08-31 ENCOUNTER — Other Ambulatory Visit: Payer: Self-pay | Admitting: Internal Medicine

## 2019-08-31 DIAGNOSIS — D500 Iron deficiency anemia secondary to blood loss (chronic): Secondary | ICD-10-CM

## 2019-08-31 DIAGNOSIS — D563 Thalassemia minor: Secondary | ICD-10-CM

## 2019-09-01 ENCOUNTER — Other Ambulatory Visit (INDEPENDENT_AMBULATORY_CARE_PROVIDER_SITE_OTHER): Payer: Medicare HMO

## 2019-09-01 ENCOUNTER — Encounter: Payer: Self-pay | Admitting: Internal Medicine

## 2019-09-01 ENCOUNTER — Other Ambulatory Visit: Payer: Self-pay

## 2019-09-01 ENCOUNTER — Ambulatory Visit (INDEPENDENT_AMBULATORY_CARE_PROVIDER_SITE_OTHER): Payer: Medicare HMO | Admitting: Internal Medicine

## 2019-09-01 VITALS — BP 130/86 | HR 85 | Temp 98.3°F | Ht 67.0 in | Wt 129.0 lb

## 2019-09-01 DIAGNOSIS — Z23 Encounter for immunization: Secondary | ICD-10-CM

## 2019-09-01 DIAGNOSIS — J449 Chronic obstructive pulmonary disease, unspecified: Secondary | ICD-10-CM

## 2019-09-01 DIAGNOSIS — G43109 Migraine with aura, not intractable, without status migrainosus: Secondary | ICD-10-CM | POA: Diagnosis not present

## 2019-09-01 DIAGNOSIS — Z0000 Encounter for general adult medical examination without abnormal findings: Secondary | ICD-10-CM | POA: Diagnosis not present

## 2019-09-01 DIAGNOSIS — D563 Thalassemia minor: Secondary | ICD-10-CM | POA: Diagnosis not present

## 2019-09-01 LAB — CBC
HCT: 37.5 % — ABNORMAL LOW (ref 39.0–52.0)
Hemoglobin: 11.8 g/dL — ABNORMAL LOW (ref 13.0–17.0)
MCHC: 31.3 g/dL (ref 30.0–36.0)
MCV: 79.6 fl (ref 78.0–100.0)
Platelets: 73 10*3/uL — ABNORMAL LOW (ref 150.0–400.0)
RBC: 4.72 Mil/uL (ref 4.22–5.81)
RDW: 15.4 % (ref 11.5–15.5)
WBC: 4.9 10*3/uL (ref 4.0–10.5)

## 2019-09-01 LAB — COMPREHENSIVE METABOLIC PANEL
ALT: 17 U/L (ref 0–53)
AST: 31 U/L (ref 0–37)
Albumin: 3.9 g/dL (ref 3.5–5.2)
Alkaline Phosphatase: 61 U/L (ref 39–117)
BUN: 11 mg/dL (ref 6–23)
CO2: 26 mEq/L (ref 19–32)
Calcium: 9.4 mg/dL (ref 8.4–10.5)
Chloride: 102 mEq/L (ref 96–112)
Creatinine, Ser: 0.97 mg/dL (ref 0.40–1.50)
GFR: 96.13 mL/min (ref >60.00)
Glucose, Bld: 94 mg/dL (ref 70–99)
Potassium: 4.4 mEq/L (ref 3.5–5.1)
Sodium: 136 mEq/L (ref 135–145)
Total Bilirubin: 0.4 mg/dL (ref 0.2–1.2)
Total Protein: 7.3 g/dL (ref 6.0–8.3)

## 2019-09-01 LAB — LIPID PANEL
Cholesterol: 137 mg/dL (ref 0–200)
HDL: 54.9 mg/dL (ref >39.00)
LDL Cholesterol: 67 mg/dL (ref 0–99)
NonHDL: 82.24
Total CHOL/HDL Ratio: 2
Triglycerides: 75 mg/dL (ref 0.0–149.0)
VLDL: 15 mg/dL (ref 0.0–40.0)

## 2019-09-01 LAB — VITAMIN B12: Vitamin B-12: 239 pg/mL (ref 211–911)

## 2019-09-01 LAB — FERRITIN: Ferritin: 48.1 ng/mL (ref 22.0–322.0)

## 2019-09-01 LAB — VITAMIN D 25 HYDROXY (VIT D DEFICIENCY, FRACTURES): VITD: 14.09 ng/mL — ABNORMAL LOW (ref 30.00–100.00)

## 2019-09-01 MED ORDER — ZOSTER VAC RECOMB ADJUVANTED 50 MCG/0.5ML IM SUSR: 0.5000 mL | Freq: Once | INTRAMUSCULAR | 1 refills | Status: AC

## 2019-09-01 NOTE — Assessment & Plan Note (Signed)
Checking CBC and ferritin.  

## 2019-09-01 NOTE — Assessment & Plan Note (Signed)
Taking topamax and atenolol for prevention. Still having about 2 migraines per month and uses tylenol and imitrex. Asked to keep journal to help narrow triggers.

## 2019-09-01 NOTE — Assessment & Plan Note (Signed)
Flu shot given. Shingrix given rx. Tetanus up to date. Colonoscopy up to date. Counseled about sun safety and mole surveillance. Counseled about the dangers of distracted driving. Given 10 year screening recommendations.

## 2019-09-01 NOTE — Patient Instructions (Addendum)
We have given you a prescription for the shingles vaccine to get a shot, then a second shot in 3 months after.   Think about keeping a journal with foods and sleeping to see if there is a pattern to the headaches.   Health Maintenance, Male Adopting a healthy lifestyle and getting preventive care are important in promoting health and wellness. Ask your health care provider about:  The right schedule for you to have regular tests and exams.  Things you can do on your own to prevent diseases and keep yourself healthy. What should I know about diet, weight, and exercise? Eat a healthy diet   Eat a diet that includes plenty of vegetables, fruits, low-fat dairy products, and lean protein.  Do not eat a lot of foods that are high in solid fats, added sugars, or sodium. Maintain a healthy weight Body mass index (BMI) is a measurement that can be used to identify possible weight problems. It estimates body fat based on height and weight. Your health care provider can help determine your BMI and help you achieve or maintain a healthy weight. Get regular exercise Get regular exercise. This is one of the most important things you can do for your health. Most adults should:  Exercise for at least 150 minutes each week. The exercise should increase your heart rate and make you sweat (moderate-intensity exercise).  Do strengthening exercises at least twice a week. This is in addition to the moderate-intensity exercise.  Spend less time sitting. Even light physical activity can be beneficial. Watch cholesterol and blood lipids Have your blood tested for lipids and cholesterol at 58 years of age, then have this test every 5 years. You may need to have your cholesterol levels checked more often if:  Your lipid or cholesterol levels are high.  You are older than 58 years of age.  You are at high risk for heart disease. What should I know about cancer screening? Many types of cancers can be  detected early and may often be prevented. Depending on your health history and family history, you may need to have cancer screening at various ages. This may include screening for:  Colorectal cancer.  Prostate cancer.  Skin cancer.  Lung cancer. What should I know about heart disease, diabetes, and high blood pressure? Blood pressure and heart disease  High blood pressure causes heart disease and increases the risk of stroke. This is more likely to develop in people who have high blood pressure readings, are of African descent, or are overweight.  Talk with your health care provider about your target blood pressure readings.  Have your blood pressure checked: ? Every 3-5 years if you are 50-77 years of age. ? Every year if you are 74 years old or older.  If you are between the ages of 60 and 32 and are a current or former smoker, ask your health care provider if you should have a one-time screening for abdominal aortic aneurysm (AAA). Diabetes Have regular diabetes screenings. This checks your fasting blood sugar level. Have the screening done:  Once every three years after age 19 if you are at a normal weight and have a low risk for diabetes.  More often and at a younger age if you are overweight or have a high risk for diabetes. What should I know about preventing infection? Hepatitis B If you have a higher risk for hepatitis B, you should be screened for this virus. Talk with your health care provider to  find out if you are at risk for hepatitis B infection. Hepatitis C Blood testing is recommended for:  Everyone born from [birth year] through 1965.  Anyone with known risk factors for hepatitis C. Sexually transmitted infections (STIs)  You should be screened each year for STIs, including gonorrhea and chlamydia, if: ? You are sexually active and are younger than 58 years of age. ? You are older than 58 years of age and your health care provider tells you that you are at risk  for this type of infection. ? Your sexual activity has changed since you were last screened, and you are at increased risk for chlamydia or gonorrhea. Ask your health care provider if you are at risk.  Ask your health care provider about whether you are at high risk for HIV. Your health care provider may recommend a prescription medicine to help prevent HIV infection. If you choose to take medicine to prevent HIV, you should first get tested for HIV. You should then be tested every 3 months for as long as you are taking the medicine. Follow these instructions at home: Lifestyle  Do not use any products that contain nicotine or tobacco, such as cigarettes, e-cigarettes, and chewing tobacco. If you need help quitting, ask your health care provider.  Do not use street drugs.  Do not share needles.  Ask your health care provider for help if you need support or information about quitting drugs. Alcohol use  Do not drink alcohol if your health care provider tells you not to drink.  If you drink alcohol: ? Limit how much you have to 0-2 drinks a day. ? Be aware of how much alcohol is in your drink. In the [place] one drink equals one 12 oz bottle of beer (355 mL), one 5 oz glass of wine (148 mL), or one 1 oz glass of hard liquor (44 mL). General instructions  Schedule regular health, dental, and eye exams.  Stay current with your vaccines.  Tell your health care provider if: ? You often feel depressed. ? You have ever been abused or do not feel safe at home. Summary  Adopting a healthy lifestyle and getting preventive care are important in promoting health and wellness.  Follow your health care provider's instructions about healthy diet, exercising, and getting tested or screened for diseases.  Follow your health care provider's instructions on monitoring your cholesterol and blood pressure. This information is not intended to replace advice given to you by your health care provider.  Make sure you discuss any questions you have with your health care provider. Document Released: 05/23/2008 Document Revised: 11/18/2018 Document Reviewed: 11/18/2018 Elsevier Patient Education  2020 [employer].

## 2019-09-01 NOTE — Assessment & Plan Note (Signed)
Rare inhaler usage, not smoking and reminded for lifelong cessation of this.

## 2019-09-01 NOTE — Progress Notes (Signed)
   Subjective:   Patient ID: Vincent [name], male    DOB: 07/04/1961, 58 y.o.   MRN: [id number]  HPI The patient is a 58 YO man coming in for physical.   PMH, [place], social history reviewed and updated  Review of Systems  Constitutional: Negative.   HENT: Negative.   Eyes: Negative.   Respiratory: Negative for cough, chest tightness and shortness of breath.   Cardiovascular: Negative for chest pain, palpitations and leg swelling.  Gastrointestinal: Negative for abdominal distention, abdominal pain, constipation, diarrhea, nausea and vomiting.  Musculoskeletal: Negative.   Skin: Negative.   Neurological: Negative.   Psychiatric/Behavioral: Negative.     Objective:  Physical Exam Constitutional:      Appearance: He is well-developed.  HENT:     Head: Normocephalic and atraumatic.  Neck:     Musculoskeletal: Normal range of motion.  Cardiovascular:     Rate and Rhythm: Normal rate and regular rhythm.  Pulmonary:     Effort: Pulmonary effort is normal. No respiratory distress.     Breath sounds: Normal breath sounds. No wheezing or rales.  Abdominal:     General: Bowel sounds are normal. There is no distension.     Palpations: Abdomen is soft.     Tenderness: There is no abdominal tenderness. There is no rebound.  Skin:    General: Skin is warm and dry.  Neurological:     Mental Status: He is alert and oriented to person, place, and time.     Coordination: Coordination normal.     Vitals:   09/01/19 0755  BP: 130/86  Pulse: 85  Temp: 98.3 F (36.8 C)  TempSrc: Oral  SpO2: 99%  Weight: 129 lb (58.5 kg)  Height: 5\' 7"  (1.702 m)    Assessment & Plan:  Flu shot given at visit

## 2019-09-07 ENCOUNTER — Emergency Department (HOSPITAL_COMMUNITY)
Admission: EM | Admit: 2019-09-07 | Discharge: 2019-09-09 | Disposition: E | Payer: Medicare HMO | Attending: Emergency Medicine | Admitting: Emergency Medicine

## 2019-09-07 ENCOUNTER — Encounter (HOSPITAL_COMMUNITY): Payer: Self-pay

## 2019-09-07 DIAGNOSIS — I639 Cerebral infarction, unspecified: Secondary | ICD-10-CM | POA: Diagnosis not present

## 2019-09-07 DIAGNOSIS — R0689 Other abnormalities of breathing: Secondary | ICD-10-CM | POA: Diagnosis not present

## 2019-09-07 DIAGNOSIS — R404 Transient alteration of awareness: Secondary | ICD-10-CM | POA: Diagnosis not present

## 2019-09-07 DIAGNOSIS — I469 Cardiac arrest, cause unspecified: Secondary | ICD-10-CM | POA: Diagnosis not present

## 2019-09-07 DIAGNOSIS — I499 Cardiac arrhythmia, unspecified: Secondary | ICD-10-CM | POA: Diagnosis not present

## 2019-09-07 DIAGNOSIS — R000 Tachycardia, unspecified: Secondary | ICD-10-CM | POA: Diagnosis not present

## 2019-09-07 DIAGNOSIS — R0602 Shortness of breath: Secondary | ICD-10-CM | POA: Diagnosis not present

## 2019-09-07 MED ORDER — SODIUM BICARBONATE 8.4 % IV SOLN
INTRAVENOUS | Status: AC | PRN
  Administered 2019-09-07: 50 meq via INTRAVENOUS

## 2019-09-07 MED ORDER — EPINEPHRINE 1 MG/10ML IJ SOSY
PREFILLED_SYRINGE | INTRAMUSCULAR | Status: AC | PRN
  Administered 2019-09-07: 1 mg via INTRAVENOUS

## 2019-09-07 NOTE — Code Documentation (Addendum)
Pulse check, PEA, no pulse, time of death 2352-03-04

## 2019-09-07 NOTE — Code Documentation (Addendum)
Pt comes via [facility], called out for resp distress, was walking down that hallway and then had seizure like activity and collapsed at 2231 CPR initiated, asystole initial rhythm.Shocked X 3 in vfib and torsades, pulse regained at 2335 for appx 2 minutes and then lost again. Total of 14 epi given 300 amiodarone and 2g mag

## 2019-09-07 NOTE — Code Documentation (Addendum)
Patient time of death occurred at March 16, 2352

## 2019-09-08 ENCOUNTER — Encounter: Payer: Self-pay | Admitting: Internal Medicine

## 2019-09-08 ENCOUNTER — Encounter (HOSPITAL_COMMUNITY): Payer: Self-pay

## 2019-09-08 MED FILL — Medication: Qty: 1 | Status: AC

## 2019-09-09 ENCOUNTER — Telehealth: Payer: Self-pay | Admitting: Emergency Medicine

## 2019-09-09 ENCOUNTER — Telehealth: Payer: Self-pay | Admitting: Internal Medicine

## 2019-09-09 NOTE — Telephone Encounter (Signed)
Last year he was 35 and at recent visit less than a week prior to death he was 29. I am confused by this statement. He is not losing weight recently to my knowledge.

## 2019-09-09 NOTE — Telephone Encounter (Signed)
Marked Urgent

## 2019-09-09 NOTE — Telephone Encounter (Signed)
Informed Sarah of MD response

## 2019-09-09 NOTE — Telephone Encounter (Signed)
Patient passed away... [facility] calling, Vincent [name]. Pt is a [facility] and they see in Epic that pt had lost a lot of weight, wanting to know what kind of insight that Dr C has about anything possibilities being infectious. States time senistive CALL BACK [phone]

## 2019-09-09 NOTE — ED Provider Notes (Signed)
CHIEF COMPLAINT: Cardiac arrest  HPI: Patient is a 58 year old male with history of COPD, thalassemia minor who presented to the emergency department with EMS in cardiac arrest.  EMS reports that EMS was called out secondary to shortness of breath.  Fire department arrived first and patient had a "blank stare" but was walking in the kitchen.  They noted possible seizure activity while sitting in a chair.  On EMS arrival, patient was "rigid" and had "agonal" respirations.  Patient lost pulses between 10:25 PM and 10:30 PM.  Initially in asystole per EMS.  There was a brief return of spontaneous circulation at 10:35 PM until 10:39 PM when patient was in sinus tachycardia.  During the course of his resuscitation, patient had 3 episodes of V. fib and was defibrillated 3 times.  EMS thought 1 of these episodes may have been torsades and gave him 2 g of IV magnesium.  Also given 300 mg of IV amiodarone.  He was given 14 epinephrines.  King airway in place.  Capnography in the 90s.  ROS: Level 5 caveat secondary to cardiac arrest  PAST MEDICAL HISTORY/PAST SURGICAL HISTORY:  History reviewed. No pertinent past medical history.  MEDICATIONS:  Prior to Admission medications   Not on File    ALLERGIES:  No Known Allergies  SOCIAL HISTORY:  Social History   Tobacco Use  . Smoking status: Not on file  Substance Use Topics  . Alcohol use: Not on file    FAMILY HISTORY: No family history on file.  EXAM: CONSTITUTIONAL: GCS 3 HEAD: Normocephalic, atraumatic EYES: Pupils fixed and dilated, no corneal reflex ENT: King airway in place NECK: Trachea midline CARD: Patient is pulseless RESP: Patient has a King airway in place and is being bagged.  Rhonchorous equal breath sounds bilaterally. ABD/GI: Abdomen soft.  Mildly distended. BACK:  The back appears normal and there are no lesions appreciated EXT: IO in the right tibia.  No edema.  No bony deformity. SKIN: Slightly cool to touch.  No rash  on exposed skin. NEURO: GCS 3.  MEDICAL DECISION MAKING: Patient here in cardiac arrest.  Suspect likely secondary to a COPD exacerbation.  Blood glucose here 257.  On arrival patient in PEA with a rate of 15.  Patient given 1 epinephrine and 1 amp of bicarbonate here.  CPR continued for approximately 5 minutes.  Patient in PEA which quickly turned into asystole.  Given no signs of life with an extended downtime, decision was made to stop further resuscitative measures.  Time of death 11:53 PM.  ED PROGRESS: Updated patient's son-in-law, Gaylord Shih [phone]) by phone.  Family is not coming to the emergency department at this time.  Patient's wife's name is Crimson Helle [phone]).  Discussed with Maxie Barb, ME on call.  States this would not be a medical examiner case.  Patient's PCP is Dr. Pricilla Holm with [facility].  Will update physician on call for PCP.   1:10 AM  D/w Dr. Scarlette Calico on call for Dr. Sharlet Salina.  [facility] will complete death certificate.  I reviewed all nursing notes, vitals, pertinent previous records, EKGs, lab and urine results, imaging (as available).   Vincent [name] was evaluated in Emergency Department on 2019/09/15 for the symptoms described in the history of present illness. He was evaluated in the context of the global COVID-19 pandemic, which necessitated consideration that the patient might be at risk for infection with the SARS-CoV-2 virus that causes COVID-19. Institutional protocols and algorithms that pertain to the evaluation  of patients at risk for COVID-19 are in a state of rapid change based on information released by regulatory bodies including the CDC and federal and state organizations. These policies and algorithms were followed during the patient's care in the ED.    Ward, Delice Bison, DO 2019/09/28 [phone]

## 2019-09-09 NOTE — Telephone Encounter (Signed)
[facility] dropped off death certificate to be signed. They stated they could not reach anyone at Medical Records. Please call [phone] when ready to be picked up.

## 2019-09-09 NOTE — Telephone Encounter (Signed)
Placed signed death certificate up front with jamie

## 2019-09-09 DEATH — deceased

## 2019-09-10 ENCOUNTER — Telehealth: Payer: Self-pay | Admitting: *Deleted

## 2019-09-10 NOTE — Telephone Encounter (Signed)
Received original D/C from [facility] notified for Pick up.

## 2019-09-30 ENCOUNTER — Other Ambulatory Visit: Payer: Medicare HMO

## 2019-09-30 ENCOUNTER — Ambulatory Visit: Payer: Medicare HMO | Admitting: Internal Medicine

## 2019-11-25 ENCOUNTER — Other Ambulatory Visit: Payer: Self-pay | Admitting: Internal Medicine

## 2019-11-27 ENCOUNTER — Other Ambulatory Visit: Payer: Self-pay | Admitting: Nurse Practitioner

## 2020-07-13 ENCOUNTER — Ambulatory Visit: Payer: Self-pay
